# Patient Record
Sex: Female | Born: 1955 | Race: Black or African American | Hispanic: No | Marital: Single | Smoking: Never smoker
Health system: Southern US, Community
[De-identification: ages and names within clinical notes are randomized; demographics above are authoritative.]

## PROBLEM LIST (undated history)

## (undated) DIAGNOSIS — K219 Gastro-esophageal reflux disease without esophagitis: Secondary | ICD-10-CM

## (undated) DIAGNOSIS — Z87442 Personal history of urinary calculi: Secondary | ICD-10-CM

## (undated) DIAGNOSIS — R4589 Other symptoms and signs involving emotional state: Secondary | ICD-10-CM

## (undated) DIAGNOSIS — N200 Calculus of kidney: Secondary | ICD-10-CM

## (undated) DIAGNOSIS — I10 Essential (primary) hypertension: Secondary | ICD-10-CM

## (undated) DIAGNOSIS — E7800 Pure hypercholesterolemia, unspecified: Secondary | ICD-10-CM

## (undated) DIAGNOSIS — R7301 Impaired fasting glucose: Secondary | ICD-10-CM

## (undated) DIAGNOSIS — R232 Flushing: Secondary | ICD-10-CM

## (undated) HISTORY — DX: Other symptoms and signs involving emotional state: R45.89

## (undated) HISTORY — DX: Calculus of kidney: N20.0

## (undated) HISTORY — PX: TUBAL LIGATION: SHX77

## (undated) HISTORY — DX: Flushing: R23.2

## (undated) HISTORY — DX: Impaired fasting glucose: R73.01

## (undated) HISTORY — DX: Gastro-esophageal reflux disease without esophagitis: K21.9

## (undated) HISTORY — PX: INGUINAL HERNIA REPAIR: SHX194

## (undated) HISTORY — DX: Essential (primary) hypertension: I10

---

## 2000-11-24 ENCOUNTER — Emergency Department (HOSPITAL_COMMUNITY): Admission: EM | Admit: 2000-11-24 | Discharge: 2000-11-24 | Payer: Self-pay | Admitting: Emergency Medicine

## 2001-09-08 ENCOUNTER — Encounter: Payer: Self-pay | Admitting: Internal Medicine

## 2001-09-08 ENCOUNTER — Ambulatory Visit (HOSPITAL_COMMUNITY): Admission: RE | Admit: 2001-09-08 | Discharge: 2001-09-08 | Payer: Self-pay | Admitting: Internal Medicine

## 2002-09-09 ENCOUNTER — Encounter: Payer: Self-pay | Admitting: Internal Medicine

## 2002-09-09 ENCOUNTER — Ambulatory Visit (HOSPITAL_COMMUNITY): Admission: RE | Admit: 2002-09-09 | Discharge: 2002-09-09 | Payer: Self-pay | Admitting: Internal Medicine

## 2002-12-06 ENCOUNTER — Emergency Department (HOSPITAL_COMMUNITY): Admission: EM | Admit: 2002-12-06 | Discharge: 2002-12-06 | Payer: Self-pay | Admitting: Emergency Medicine

## 2003-01-12 ENCOUNTER — Emergency Department (HOSPITAL_COMMUNITY): Admission: EM | Admit: 2003-01-12 | Discharge: 2003-01-13 | Payer: Self-pay | Admitting: *Deleted

## 2003-09-12 ENCOUNTER — Ambulatory Visit (HOSPITAL_COMMUNITY): Admission: RE | Admit: 2003-09-12 | Discharge: 2003-09-12 | Payer: Self-pay | Admitting: Unknown Physician Specialty

## 2004-09-10 ENCOUNTER — Ambulatory Visit: Payer: Self-pay | Admitting: Family Medicine

## 2004-09-13 ENCOUNTER — Encounter (HOSPITAL_COMMUNITY): Admission: RE | Admit: 2004-09-13 | Discharge: 2004-10-13 | Payer: Self-pay | Admitting: Family Medicine

## 2004-09-13 ENCOUNTER — Ambulatory Visit (HOSPITAL_COMMUNITY): Admission: RE | Admit: 2004-09-13 | Discharge: 2004-09-13 | Payer: Self-pay | Admitting: Family Medicine

## 2004-09-13 ENCOUNTER — Ambulatory Visit (HOSPITAL_COMMUNITY): Admission: RE | Admit: 2004-09-13 | Discharge: 2004-09-13 | Payer: Self-pay | Admitting: Obstetrics and Gynecology

## 2004-10-25 ENCOUNTER — Ambulatory Visit: Payer: Self-pay | Admitting: Family Medicine

## 2004-11-15 ENCOUNTER — Ambulatory Visit: Payer: Self-pay | Admitting: Family Medicine

## 2004-11-20 ENCOUNTER — Ambulatory Visit (HOSPITAL_COMMUNITY): Admission: RE | Admit: 2004-11-20 | Discharge: 2004-11-20 | Payer: Self-pay | Admitting: Family Medicine

## 2004-11-22 ENCOUNTER — Ambulatory Visit (HOSPITAL_COMMUNITY): Admission: RE | Admit: 2004-11-22 | Discharge: 2004-11-22 | Payer: Self-pay | Admitting: Family Medicine

## 2004-11-29 ENCOUNTER — Ambulatory Visit (HOSPITAL_COMMUNITY): Admission: RE | Admit: 2004-11-29 | Discharge: 2004-11-29 | Payer: Self-pay | Admitting: Family Medicine

## 2004-12-14 ENCOUNTER — Ambulatory Visit: Payer: Self-pay | Admitting: Family Medicine

## 2005-04-15 ENCOUNTER — Ambulatory Visit: Payer: Self-pay | Admitting: Family Medicine

## 2005-09-16 ENCOUNTER — Ambulatory Visit (HOSPITAL_COMMUNITY): Admission: RE | Admit: 2005-09-16 | Discharge: 2005-09-16 | Payer: Self-pay | Admitting: Obstetrics and Gynecology

## 2005-10-07 ENCOUNTER — Ambulatory Visit (HOSPITAL_COMMUNITY): Admission: RE | Admit: 2005-10-07 | Discharge: 2005-10-07 | Payer: Self-pay | Admitting: Internal Medicine

## 2006-01-29 ENCOUNTER — Ambulatory Visit (HOSPITAL_COMMUNITY): Admission: RE | Admit: 2006-01-29 | Discharge: 2006-01-29 | Payer: Self-pay | Admitting: Obstetrics & Gynecology

## 2006-02-26 ENCOUNTER — Ambulatory Visit (HOSPITAL_COMMUNITY): Admission: RE | Admit: 2006-02-26 | Discharge: 2006-02-26 | Payer: Self-pay | Admitting: General Surgery

## 2006-02-26 HISTORY — PX: INGUINAL HERNIA REPAIR: SHX194

## 2006-08-27 ENCOUNTER — Ambulatory Visit (HOSPITAL_COMMUNITY): Admission: RE | Admit: 2006-08-27 | Discharge: 2006-08-27 | Payer: Self-pay | Admitting: Obstetrics and Gynecology

## 2006-09-19 ENCOUNTER — Ambulatory Visit (HOSPITAL_COMMUNITY): Admission: RE | Admit: 2006-09-19 | Discharge: 2006-09-19 | Payer: Self-pay | Admitting: Obstetrics and Gynecology

## 2006-10-15 ENCOUNTER — Inpatient Hospital Stay (HOSPITAL_COMMUNITY): Admission: RE | Admit: 2006-10-15 | Discharge: 2006-10-17 | Payer: Self-pay | Admitting: Obstetrics & Gynecology

## 2006-10-15 ENCOUNTER — Encounter (INDEPENDENT_AMBULATORY_CARE_PROVIDER_SITE_OTHER): Payer: Self-pay | Admitting: Specialist

## 2006-10-15 HISTORY — PX: TOTAL ABDOMINAL HYSTERECTOMY W/ BILATERAL SALPINGOOPHORECTOMY: SHX83

## 2007-06-11 ENCOUNTER — Encounter: Payer: Self-pay | Admitting: Family Medicine

## 2007-10-09 ENCOUNTER — Ambulatory Visit (HOSPITAL_COMMUNITY): Admission: RE | Admit: 2007-10-09 | Discharge: 2007-10-09 | Payer: Self-pay | Admitting: Obstetrics and Gynecology

## 2007-12-12 ENCOUNTER — Emergency Department (HOSPITAL_COMMUNITY): Admission: EM | Admit: 2007-12-12 | Discharge: 2007-12-12 | Payer: Self-pay | Admitting: Emergency Medicine

## 2008-09-09 ENCOUNTER — Ambulatory Visit (HOSPITAL_COMMUNITY): Admission: RE | Admit: 2008-09-09 | Discharge: 2008-09-09 | Payer: Self-pay | Admitting: Family Medicine

## 2008-10-14 ENCOUNTER — Ambulatory Visit (HOSPITAL_COMMUNITY): Admission: RE | Admit: 2008-10-14 | Discharge: 2008-10-14 | Payer: Self-pay | Admitting: Obstetrics and Gynecology

## 2008-12-06 ENCOUNTER — Encounter: Payer: Self-pay | Admitting: Internal Medicine

## 2008-12-13 ENCOUNTER — Ambulatory Visit (HOSPITAL_COMMUNITY): Admission: RE | Admit: 2008-12-13 | Discharge: 2008-12-13 | Payer: Self-pay | Admitting: Internal Medicine

## 2008-12-13 ENCOUNTER — Ambulatory Visit: Payer: Self-pay | Admitting: Internal Medicine

## 2008-12-13 HISTORY — PX: COLONOSCOPY: SHX174

## 2008-12-29 ENCOUNTER — Encounter: Payer: Self-pay | Admitting: Internal Medicine

## 2009-04-04 ENCOUNTER — Emergency Department (HOSPITAL_COMMUNITY): Admission: EM | Admit: 2009-04-04 | Discharge: 2009-04-04 | Payer: Self-pay | Admitting: Emergency Medicine

## 2009-09-15 ENCOUNTER — Ambulatory Visit (HOSPITAL_COMMUNITY): Admission: RE | Admit: 2009-09-15 | Discharge: 2009-09-15 | Payer: Self-pay | Admitting: Family Medicine

## 2009-10-20 ENCOUNTER — Ambulatory Visit (HOSPITAL_COMMUNITY): Admission: RE | Admit: 2009-10-20 | Discharge: 2009-10-20 | Payer: Self-pay | Admitting: Obstetrics and Gynecology

## 2009-11-29 ENCOUNTER — Ambulatory Visit: Payer: Self-pay | Admitting: Gastroenterology

## 2009-11-29 DIAGNOSIS — R110 Nausea: Secondary | ICD-10-CM | POA: Insufficient documentation

## 2009-11-29 DIAGNOSIS — K219 Gastro-esophageal reflux disease without esophagitis: Secondary | ICD-10-CM | POA: Insufficient documentation

## 2009-11-29 DIAGNOSIS — K5909 Other constipation: Secondary | ICD-10-CM | POA: Insufficient documentation

## 2010-03-12 ENCOUNTER — Emergency Department (HOSPITAL_COMMUNITY): Admission: EM | Admit: 2010-03-12 | Discharge: 2010-03-12 | Payer: Self-pay | Admitting: Emergency Medicine

## 2010-07-01 ENCOUNTER — Encounter: Payer: Self-pay | Admitting: Obstetrics and Gynecology

## 2010-07-12 NOTE — Letter (Signed)
Summary: rpc chart  rpc chart   Imported By: Curtis Sites 03/28/2010 11:30:03  _____________________________________________________________________  External Attachment:    Type:   Image     Comment:   External Document

## 2010-07-12 NOTE — Assessment & Plan Note (Signed)
Summary: stomach pains/acid reflux/ss   Visit Type:  f/u Primary Care Provider:  Lubertha South  Chief Complaint:  abd pain, reflux, and constipation.  History of Present Illness: Patient presents today for further evaluation of abd pain, reflux, constipation. She was seen previously at time of screening TCS by Dr. Jena Gauss last year. She c/o symptoms for one year. She has tried Nexium and omeprazole for several weeks but didn't help.  She works second shift. Due to heat where she works she doesn't feel like eating until she gets home at night. She admits to eating before bed. She denies waking up in middle of night but in AM she wakes up nauseated. Some heartburn. Weight up ten pounds in one month. Works in heat, makes it worse. No vomiting. Finds it hard to eat. No dysphagia. Some mild abd pains around navel. Unrelated to meals. Pain sometimes better with BM. BM infrequent, 3-4 times per week. No melena, brbpr. Uses gylcerin supp several times per week.   CT A/P in 4/11--> Nonobstructing bilateral renal calculi. Tiny umbilical hernia containing fat. Questionable tiny fascial defect in the left lower quadrant anterior abdominal wall with minimal fat herniation as above. Suspect prior lateral left lower quadrant ventral hernia repair. Small soft tissue nodule in the pelvis, new since previous exam, suspect related to granuloma formation or fat necrosis as result ofinterval hysterectomy. She saw Dr. Caesar Bookman who did not recommend surgery at this point.  Current Medications (verified): 1)  Celexa 40 Mg Tabs (Citalopram Hydrobromide) .... Once Daily 2)  Vicodin 5-500 Mg Tabs (Hydrocodone-Acetaminophen) .... As Needed 3)  Promethazine Hcl 25 Mg Tabs (Promethazine Hcl) .... As Needed 4)  Zocor 20 Mg Tabs (Simvastatin) .... Once Daily 5)  Relafen 750mg  .... Once Daily  Allergies (verified): 1)  ! Codeine 2)  ! Darvocet  Past History:  Past Medical History: Hyperlipidemia TCS, 7/10, Dr. Rourk-->  normal. Recent left foot pain/swelling followed by Dr. Lubertha South.  Past Surgical History: Hysterectomy with BSO, 5/08, Dr. Despina Hidden, secondary to large uterine fibroid, pelvic pain Left inguinal hernia repair, 2007, Dr. Lovell Sheehan  Family History: No FH of CRC, liver disease, chronic GI illnesses.   Social History: Single. One son. Never smoked. No alcohol or drugs. Works at [employer], Writer parts.   Review of Systems General:  Denies fever, chills, sweats, anorexia, fatigue, weakness, and weight loss. Eyes:  Denies vision loss. ENT:  Denies nasal congestion, sore throat, hoarseness, and difficulty swallowing. CV:  Denies chest pains, angina, palpitations, dyspnea on exertion, and peripheral edema. Resp:  Denies dyspnea at rest, dyspnea with exercise, cough, sputum, and wheezing. GI:  See HPI. GU:  Denies urinary burning and blood in urine. MS:  Complains of joint pain / LOM. Derm:  Denies rash and itching. Neuro:  Denies weakness, frequent headaches, memory loss, and confusion. Psych:  Denies depression and anxiety. Endo:  Denies unusual weight change. Heme:  Denies bruising and bleeding. Allergy:  Denies hives and rash.  Vital Signs:  Patient profile:   54 year old female Height:      62 inches Weight:      183 pounds BMI:     33.59 Temp:     98.0 degrees F oral Pulse rate:   60 / minute BP sitting:   104 / 68  (right arm) Cuff size:   regular  Vitals Entered By: Hendricks Limes LPN (November 29, 2009 2:22 PM)  Physical Exam  General:  Well developed, well nourished, no acute distress.  Head:  Normocephalic and atraumatic. Eyes:  Conjunctivae pink, no scleral icterus.  Mouth:  Oropharyngeal mucosa moist, pink.  No lesions, erythema or exudate.    Neck:  Supple; no masses or thyromegaly. Lungs:  Clear throughout to auscultation. Heart:  Regular rate and rhythm; no murmurs, rubs,  or bruits. Abdomen:  Bowel sounds normal.  Abdomen is soft, nontender, nondistended.  No  rebound or guarding.  No hepatosplenomegaly, masses or hernias.  No abdominal bruits.  Extremities:  No clubbing, cyanosis, edema or deformities noted. Neurologic:  Alert and  oriented x4;  grossly normal neurologically. Skin:  Intact without significant lesions or rashes. Cervical Nodes:  No significant cervical adenopathy. Psych:  Alert and cooperative. Normal mood and affect.  Impression & Recommendations:  Problem # 1:  NAUSEA, CHRONIC (ICD-787.02)  Nausea in AM and related to meals. No vomiting. Very mild abd pain which may be due to constipation. Suspect nausea due to GERD as she has typical gerd symptoms as well. She should avoid eating before bedtime. Add Dexilant for 20 days, samples provided. Her insurance company will not cover it so RX for lansoprazole given to take once Dexilant finished. She will call with PR in 2-3 weeks. If still problems, consider EGD. FYI, gallbladder remains in situ...symptoms not typical at this time. Discussed antireflux measures.  Orders: Est. Patient Level IV (84132)  Problem # 2:  CONSTIPATION, CHRONIC (ICD-564.09)  Add Miralax 17 grams daily for two weeks and then daily as needed to maintain regular BMs. Use gylcerin suppositories sparingly.   Orders: Est. Patient Level IV (44010)  Patient Instructions: 1)  Start Dexilant for your stomach. Take one 30 mins before first meal of day. #20 samples given. Your insurance will not cover this so your prescription is for lansoprazole which is closest to [facility]. Please start once Dexilant samples are finished. 2)  Start Miralax 17 grams (one capful) daily as needed for constipation. Use daily for first two weeks then as needed to have regular BM.  3)  Call with progress report in few weeks. If still with problems at that time, will pursue further work-up. 4)  Avoid foods high in acid content ( tomatoes, citrus juices, spicy foods) . Avoid eating within 3 to 4 hours of lying down or before exercising. Do  not over eat; try smaller more frequent meals.   5)  The medication list was reviewed and reconciled.  All changed / newly prescribed medications were explained.  A complete medication list was provided to the patient / caregiver. Prescriptions: LANSOPRAZOLE 30 MG CPDR (LANSOPRAZOLE) one by mouth 30 mins before first meal of day  #30 x 11   Entered and Authorized by:   Leanna Battles. Dixon Boos   Signed by:   Leanna Battles Roshan Roback PA-C on 11/29/2009   Method used:   Print then Give to Patient   RxID:   [id number] POLYETHYLENE GLYCOL 3350  POWD (POLYETHYLENE GLYCOL 3350) 17 grams by mouth daily for two weeks and then as needed to maintain regular bm  #527 grams x 5   Entered and Authorized by:   Leanna Battles. Dixon Boos   Signed by:   Leanna Battles Neliah Cuyler PA-C on 11/29/2009   Method used:   Print then Give to Patient   RxID:   [id number]

## 2010-08-22 LAB — URINE MICROSCOPIC-ADD ON

## 2010-08-22 LAB — URINE CULTURE

## 2010-08-22 LAB — URINALYSIS, ROUTINE W REFLEX MICROSCOPIC
Nitrite: NEGATIVE
Specific Gravity, Urine: <1.005 — ABNORMAL LOW (ref 1.005–1.030)
Urobilinogen, UA: 0.2 mg/dL (ref 0.0–1.0)

## 2010-09-14 ENCOUNTER — Other Ambulatory Visit (HOSPITAL_COMMUNITY): Payer: Self-pay | Admitting: Family Medicine

## 2010-09-14 DIAGNOSIS — Z139 Encounter for screening, unspecified: Secondary | ICD-10-CM

## 2010-10-09 ENCOUNTER — Ambulatory Visit (HOSPITAL_COMMUNITY)
Admission: RE | Admit: 2010-10-09 | Discharge: 2010-10-09 | Disposition: A | Discharge status: Home / Self Care | Payer: BC Managed Care – PPO | Source: Ambulatory Visit | Attending: Family Medicine | Admitting: Family Medicine

## 2010-10-09 ENCOUNTER — Other Ambulatory Visit: Payer: Self-pay | Admitting: Family Medicine

## 2010-10-09 DIAGNOSIS — M25562 Pain in left knee: Secondary | ICD-10-CM

## 2010-10-09 DIAGNOSIS — M25569 Pain in unspecified knee: Secondary | ICD-10-CM | POA: Insufficient documentation

## 2010-10-23 ENCOUNTER — Ambulatory Visit (HOSPITAL_COMMUNITY): Payer: Self-pay

## 2010-10-23 NOTE — Op Note (Signed)
NAME:  Shelby Guerra, DANTE              ACCOUNT NO.:  [id number]   MEDICAL RECORD NO.:  [id number]          PATIENT TYPE:  AMB   LOCATION:  DAY                           FACILITY:  [facility]   PHYSICIAN:  R. Roetta Sessions, M.D. DATE OF BIRTH:  April 05, 1956   DATE OF PROCEDURE:  12/13/2008  DATE OF DISCHARGE:                               OPERATIVE REPORT   PROCEDURE PERFORMED:  Screening colonoscopy.   INDICATIONS FOR PROCEDURE:  This is a 55 year old lady with no lower GI  tract symptoms, sent over courtesy [name], FNP for colorectal  cancer screening via colonoscopy.  There is no family history of colon  cancer or polyps.  She has never had her lower GI tract imaged.  Colonoscopy is now being done as a screening maneuver.  Risks, benefits,  alternatives and limitations have been reviewed, questions answered.  Please see documentation in the medical record.   PROCEDURE NOTE:  O2 saturation, blood pressure, pulse and respirations  were monitored throughout the entirety of the procedure.   CONSCIOUS SEDATION:  Versed 4 mg IV, Demerol 100 mg IV in divided doses.   INSTRUMENT:  Pentax video chip system.   FINDINGS:  Digital rectal exam revealed no abnormalities.   ENDOSCOPIC FINDINGS:  The prep was adequate.  Colon:  Colonic mucosa was surveyed from rectosigmoid junction to the  left, transverse, right colon to the appendiceal orifice, ileocecal  valve and cecum.  These structures were well seen and photographed for  the record.  From this level scope was slowly, cautiously withdrawn.  All previously mentioned mucosal surfaces were again seen.  The colonic  mucosa appeared normal.  Scope was pulled down to the rectum where a  thorough examination of the rectal mucosa and en face view of the anal  canal and rectum demonstrated no abnormalities.  The patient tolerated  the procedure well and was reacted in endoscopy.  Cecal withdrawal time  6 minutes 30 seconds.   IMPRESSION:  1.  Normal rectum.  2. Normal colon.   RECOMMENDATIONS:  A repeat screening colonoscopy in 10 years.      Jonathon Bellows, M.D.  Electronically Signed     RMR/MEDQ  D:  12/13/2008  T:  12/13/2008  Job:  [id number]   cc:   [name] FNP

## 2010-10-26 NOTE — Discharge Summary (Signed)
NAMETRISTAN, BRAMBLE NO.:  [id number]   MEDICAL RECORD NO.:  [id number]          PATIENT TYPE:  INP   LOCATION:  A428                          FACILITY:  [facility]   PHYSICIAN:  Lazaro Arms, M.D.   DATE OF BIRTH:  March 29, 1956   DATE OF ADMISSION:  10/15/2006  DATE OF DISCHARGE:  05/09/2008LH                               DISCHARGE SUMMARY   DISCHARGE DIAGNOSES:  1. Status post total abdominal hysterectomy, bilateral salpingo-      oophorectomy.  2. Unremarkable postoperative course.   PROCEDURE:  Total abdominal hysterectomy bilateral salpingo-  oophorectomy.   Please refer to the history and physical and op note for details of  admission to the hospital.   HOSPITAL COURSE:  The patient was admitted postoperatively.  She had a  completely unremarkable postoperative course, started clear liquids and  a regular diet, voided without symptoms.  She was extensively  ambulatory, and the incision was clean, dry and intact.  She did have a  little initial temperature elevation to 100.1, but thereafter it has  been normal.  It is probably atelectasis.  She is a not a smoker.  Incision was clean, dry, and intact.  She was discharged home on  Percocet and Motrin.  She will be seen at the office next week for  followup.      Lazaro Arms, M.D.  Electronically Signed     LHE/MEDQ  D:  10/17/2006  T:  10/17/2006  Job:  [id number]

## 2010-10-26 NOTE — H&P (Signed)
NAME:  JANNELL, FRANTA NO.:  [id number]   MEDICAL RECORD NO.:  [id number]          PATIENT TYPE:  AMB   LOCATION:  DAY                           FACILITY:  [facility]   PHYSICIAN:  Lazaro Arms, M.D.   DATE OF BIRTH:  03-25-1956   DATE OF ADMISSION:  DATE OF DISCHARGE:  LH                              HISTORY & PHYSICAL   Shelby Guerra is a 55 year old African-American female, gravida 1, para 1,  aborta 0, status post tubal ligation who has been seen for an enlarged  fibroid uterus in our office in the past.  We have actually got an  ultrasound back in March .  She is postmenopausal.  At that time she was  found to have a large fibroid.  Unfortunately she has begun to have a  great deal of pelvic abdominal pain.  Examination is consist with a  defeating myoma, bimanual exam reveals it to be approximately 10 week  size and quite tender.  Her adnexa is negative on exam and an  ultrasound.  We discussed the options which the patient has considered.  We felt that Lupron was not going to be a longterm solution.  As a  result, we decided to proceed with a TAH-BSO for an enlarged fibroid  uterus with degenerating myomas.   PAST MEDICAL HISTORY:  Negative.   PAST SURGICAL HISTORY:  Tubal ligation, D&C, inguinal hernia times two  days.   PAST OB:  Vaginal delivery.   ALLERGIES:  Codeine.   MEDICATIONS:  Currently none.   REVIEW OF SYSTEMS:  Otherwise negative.   PHYSICAL EXAMINATION:  VITAL SIGNS:  Her weight is 165 pounds, blood  pressure 122/66.  HEENT:  Unremarkable.  Thyroid is normal.  LUNGS:  Clear to auscultation bilaterally.  HEART:  Regular rate and rhythm with no murmurs, rubs or gallops.  BREASTS  Without mass, discharge, or skin changes.  ABDOMEN:  Benign hepatosplenomegaly or masses.  She has tenderness in  the mid pelvis.  GI:  She has normal external genitalia; vagina appears moist without  discharge.  Cervix parous, without lesions.  Uterus is 10-12 week  size  and tender to palpitation.  Adnexa is negative.  Hemoccult is negative.  No masses.  EXTREMITIES:  Warm without no edema.  NEUROLOGIC:  Grossly intact.   IMPRESSION:  1. Enlarged fibroid uterus.  2. Degeneration causing pelvic pain.   PLAN:  The patient is admitted for TAH-BSO.  She understands the risks,  benefits, indications, and alternatives.  Will proceed.      Lazaro Arms, M.D.  Electronically Signed     LHE/MEDQ  D:  10/14/2006  T:  10/14/2006  Job:  [id number]

## 2010-10-26 NOTE — Op Note (Signed)
NAMEDALEISA, HALPERIN NO.:  [id number]   MEDICAL RECORD NO.:  [id number]          PATIENT TYPE:  AMB   LOCATION:  DAY                           FACILITY:  [facility]   PHYSICIAN:  Dalia Heading, M.D.  DATE OF BIRTH:  [birth year]   DATE OF PROCEDURE:  02/26/2006  DATE OF DISCHARGE:                                 OPERATIVE REPORT   PREOPERATIVE DIAGNOSIS:  Recurrent left inguinal hernia.   POSTOPERATIVE DIAGNOSIS:  Recurrent left inguinal hernia.   PROCEDURE:  Recurrent left inguinal herniorrhaphy.   SURGEON:  Franky Macho, M.D.   ANESTHESIA:  General.   INDICATIONS:  The patient is a 55 year old black female, status post a left  inguinal herniorrhaphy over 10 years ago, who now presents with left groin  pain and a probable recurrent left inguinal hernia.  The risks and benefits  of the procedure including bleeding, infection, pain, and recurrence of the  hernia were fully explained to the patient, who gave informed consent.   PROCEDURE NOTE:  The patient was placed in the supine position.  After  general anesthesia was administered, the left groin region was prepped and  draped using the usual sterile technique with Betadine.  Surgical site  confirmation was performed.   A left groin incision was made down to the external oblique aponeurosis.  The aponeurosis was incised.  The patient was noted to have an old  polypropylene mesh plug present.  This was removed.  This is probably the  source of the patient's pain and sensation of a lump in this region.  Once  this was removed, the internal ring hernia was repaired using an  Ultrapro/Monocryl plug.  This was secured to the conjoined tendon and to the  shelving edge of Poupart's ligament using 2-0 Novofil interrupted sutures.  An onlay patch of Monocryl was then placed over this repair.  The external  oblique aponeurosis was reapproximated using a 2-0 Vicryl running suture.  The subcutaneous layer was  reapproximated using a 3-0 Vicryl interrupted  suture.  The skin was closed using a 4-0 Vicryl subcuticular suture.  0.5%  Sensorcaine was instilled into the surrounding wound.  Dermabond was then  applied.   All tape and needle counts were correct at the end of the procedure.  The  patient was awakened and transferred to PACU in stable condition.   COMPLICATIONS:  None.   SPECIMEN:  None.   BLOOD LOSS:  Minimal.      Dalia Heading, M.D.  Electronically Signed     MAJ/MEDQ  D:  02/26/2006  T:  02/27/2006  Job:  [id number]   cc:   Tilda Burrow, M.D.  Fax: [phone]

## 2010-10-26 NOTE — Op Note (Signed)
Shelby Guerra, Shelby Guerra NO.:  [id number]   MEDICAL RECORD NO.:  [id number]          PATIENT TYPE:  INP   LOCATION:  A428                          FACILITY:  [facility]   PHYSICIAN:  Lazaro Arms, M.D.   DATE OF BIRTH:  1956/03/15   DATE OF PROCEDURE:  10/15/2006  DATE OF DISCHARGE:  10/17/2006                               OPERATIVE REPORT   PREOPERATIVE DIAGNOSES:  1. Fibroid uterus.  2. Degenerating myoma with severe pelvic pain.   POSTOPERATIVE DIAGNOSES:  1. Fibroid uterus.  2. Degenerating myoma with severe pelvic pain.   PROCEDURE:  Total abdominal hysterectomy and bilateral salpingo-  oophorectomy.   SURGEON:  Lazaro Arms, M.D.   ANESTHESIA:  General endotracheal anesthesia.   FINDINGS:  Patient had a relatively small uterus.  Did have a myoma and  the ovaries appeared to be menopausal.  There were no other  abnormalities.   DESCRIPTION OF PROCEDURE:  Patient was taken to the operating room and  placed in the supine position and underwent general endotracheal  anesthesia.  The vagina was prepped and draped.  Foley catheter was  placed.  Her abdomen was prepped and draped in the usual sterile  fashion.  Pfannenstiel skin incision was made, carried down sharply to  the rectus fascia, scored in the midline and continued laterally.  The  fascia was taken off the muscle superiorly and inferiorly without  difficulty.  The muscles were divided.  Peritoneal cavity was entered.  An Alexis wound retractor was placed.  The uterine cornu were grasped.  The round ligaments were suture ligated and cut.  The infundibulopelvic  ligaments were clamped, cut, and suture ligated, double suture ligated  with good hemostasis.  Vesicouterine laceration flap was created.  The  uterine vessel was skeletonized, clamped, cut, and suture ligated  bilaterally.  The cardinal ligament down to the cervix was clamped, cut,  and suture ligated medial to the uterine vessels.  The  vagina was  crossclamped, specimen was removed.  Vaginal angle sutures were placed  and vagina was closed in interrupted figure-of-eight fashion.  There was  good hemostasis.  Pelvis irrigated vigorously.  All pedicles found to be  hemostasis.  The Alexis wound retractor was removed.  Muscles and  peritoneum reapproximated loosely.  Fascia closed using 0 Vicryl  running.  Subcutaneous tissue was made hemostatic and irrigated.  Subcutaneous tissue was reapproximated using 2-0 plain and the skin was  closed using 3-0 Vicryl and Keith needle in subcuticular fashion.  Dermabond was then placed.  The patient tolerated the procedure well.  She experienced 200 mL of blood loss.  Ancef prophylactically.      Lazaro Arms, M.D.  Electronically Signed     LHE/MEDQ  D:  10/17/2006  T:  10/17/2006  Job:  [id number]

## 2010-10-26 NOTE — Procedures (Signed)
Shelby Guerra, Shelby Guerra              ACCOUNT NO.:  [id number]   MEDICAL RECORD NO.:  [id number]          PATIENT TYPE:  OUT   LOCATION:  RESP                          FACILITY:  [facility]   PHYSICIAN:  Edward L. Juanetta Gosling, M.D.DATE OF BIRTH:  17-Apr-1956   DATE OF PROCEDURE:  11/20/2004  DATE OF DISCHARGE:                              PULMONARY FUNCTION TEST   1.  Spirometry shows a mild ventilatory defect with minimal airflow      obstruction.  2.  Lung volumes are normal.  3.  DLCO is normal.  4.  Arterial blood gases are normal.       ELH/MEDQ  D:  11/20/2004  T:  11/20/2004  Job:  [id number]

## 2010-10-26 NOTE — H&P (Signed)
NAMEESSENSE, BOUSQUET NO.:  [id number]   MEDICAL RECORD NO.:  [id number]          PATIENT TYPE:  AMB   LOCATION:  DAY                           FACILITY:  [facility]   PHYSICIAN:  Dalia Heading, M.D.  DATE OF BIRTH:  [birth year]   DATE OF ADMISSION:  DATE OF DISCHARGE:  LH                                HISTORY & PHYSICAL   CHIEF COMPLAINT:  Left inguinal pain.   HISTORY OF PRESENT ILLNESS:  The patient is a 55 year old black female who  is referred for evaluation and treatment of left groin pain.  This has been  present for several months and is made worse with straining.  She is status  post a left inguinal herniorrhaphy over ten years ago.  No nausea or  vomiting have been noted.   PAST MEDICAL HISTORY:  Unremarkable.   PAST SURGICAL HISTORY:  As noted above.   CURRENT MEDICATIONS:  None.   ALLERGIES:  CODEINE.   REVIEW OF SYSTEMS:  Noncontributory.   PHYSICAL EXAMINATION:  GENERAL:  The patient is a well-developed, well-nourished black female in no  acute distress.  LUNGS:  Clear to auscultation with equal breath sounds bilaterally.  HEART:  Examination reveals regular rate and rhythm without S3, S4, or  murmurs.  ABDOMEN:  Soft and nondistended.  She is tender in the left lateral inguinal  region with a possible hernia present.  No hepatosplenomegaly or masses  noted.   IMPRESSION:  Recurrent left inguinal hernia.   PLAN:  The patient is scheduled for recurrent left inguinal herniorrhaphy on  February 26, 2006.  The risks and benefits of the procedure including  bleeding, infection, pain, and the possibility of recurrence of the hernia  were fully explained to the patient, who gave informed consent.      Dalia Heading, M.D.  Electronically Signed     MAJ/MEDQ  D:  02/20/2006  T:  02/20/2006  Job:  [id number]   cc:   Tilda Burrow, M.D.  Fax: [phone]

## 2010-10-29 ENCOUNTER — Ambulatory Visit (HOSPITAL_COMMUNITY)
Admission: RE | Admit: 2010-10-29 | Discharge: 2010-10-29 | Disposition: A | Discharge status: Home / Self Care | Payer: BC Managed Care – PPO | Source: Ambulatory Visit | Attending: Family Medicine | Admitting: Family Medicine

## 2010-10-29 DIAGNOSIS — Z1231 Encounter for screening mammogram for malignant neoplasm of breast: Secondary | ICD-10-CM | POA: Insufficient documentation

## 2010-10-29 DIAGNOSIS — Z139 Encounter for screening, unspecified: Secondary | ICD-10-CM

## 2011-03-07 LAB — URINALYSIS, ROUTINE W REFLEX MICROSCOPIC
Glucose, UA: NEGATIVE
Nitrite: NEGATIVE
Specific Gravity, Urine: 1.015
pH: 6

## 2011-03-07 LAB — URINE MICROSCOPIC-ADD ON

## 2011-09-30 ENCOUNTER — Other Ambulatory Visit: Payer: Self-pay | Admitting: Obstetrics and Gynecology

## 2011-09-30 DIAGNOSIS — Z139 Encounter for screening, unspecified: Secondary | ICD-10-CM

## 2011-11-04 ENCOUNTER — Ambulatory Visit (HOSPITAL_COMMUNITY): Payer: BC Managed Care – PPO

## 2011-11-11 ENCOUNTER — Ambulatory Visit (HOSPITAL_COMMUNITY): Payer: BC Managed Care – PPO

## 2011-11-11 ENCOUNTER — Ambulatory Visit (HOSPITAL_COMMUNITY)
Admission: RE | Admit: 2011-11-11 | Discharge: 2011-11-11 | Disposition: A | Discharge status: Home / Self Care | Payer: BC Managed Care – PPO | Source: Ambulatory Visit | Attending: Obstetrics and Gynecology | Admitting: Obstetrics and Gynecology

## 2011-11-11 DIAGNOSIS — Z139 Encounter for screening, unspecified: Secondary | ICD-10-CM

## 2011-11-11 DIAGNOSIS — Z1231 Encounter for screening mammogram for malignant neoplasm of breast: Secondary | ICD-10-CM | POA: Insufficient documentation

## 2012-01-26 ENCOUNTER — Encounter (HOSPITAL_COMMUNITY): Payer: Self-pay | Admitting: *Deleted

## 2012-01-26 ENCOUNTER — Emergency Department (HOSPITAL_COMMUNITY)
Admission: EM | Admit: 2012-01-26 | Discharge: 2012-01-26 | Disposition: A | Discharge status: Home / Self Care | Payer: BC Managed Care – PPO | Attending: Emergency Medicine | Admitting: Emergency Medicine

## 2012-01-26 ENCOUNTER — Emergency Department (HOSPITAL_COMMUNITY): Payer: BC Managed Care – PPO

## 2012-01-26 DIAGNOSIS — S161XXA Strain of muscle, fascia and tendon at neck level, initial encounter: Secondary | ICD-10-CM

## 2012-01-26 DIAGNOSIS — Y9389 Activity, other specified: Secondary | ICD-10-CM | POA: Insufficient documentation

## 2012-01-26 DIAGNOSIS — E7800 Pure hypercholesterolemia, unspecified: Secondary | ICD-10-CM | POA: Insufficient documentation

## 2012-01-26 DIAGNOSIS — Y92009 Unspecified place in unspecified non-institutional (private) residence as the place of occurrence of the external cause: Secondary | ICD-10-CM | POA: Insufficient documentation

## 2012-01-26 DIAGNOSIS — S139XXA Sprain of joints and ligaments of unspecified parts of neck, initial encounter: Secondary | ICD-10-CM | POA: Insufficient documentation

## 2012-01-26 DIAGNOSIS — Y998 Other external cause status: Secondary | ICD-10-CM | POA: Insufficient documentation

## 2012-01-26 DIAGNOSIS — T07XXXA Unspecified multiple injuries, initial encounter: Secondary | ICD-10-CM | POA: Insufficient documentation

## 2012-01-26 DIAGNOSIS — W1789XA Other fall from one level to another, initial encounter: Secondary | ICD-10-CM | POA: Insufficient documentation

## 2012-01-26 HISTORY — DX: Pure hypercholesterolemia, unspecified: E78.00

## 2012-01-26 MED ORDER — IBUPROFEN 800 MG PO TABS
800.0000 mg | ORAL_TABLET | Freq: Once | ORAL | Status: AC
  Administered 2012-01-26: 800 mg via ORAL
  Filled 2012-01-26: qty 1

## 2012-01-26 MED ORDER — HYDROCODONE-ACETAMINOPHEN 5-325 MG PO TABS
1.0000 | ORAL_TABLET | Freq: Once | ORAL | Status: AC
  Administered 2012-01-26: 1 via ORAL
  Filled 2012-01-26: qty 1

## 2012-01-26 MED ORDER — HYDROCODONE-ACETAMINOPHEN 5-325 MG PO TABS: 1.0000 | ORAL_TABLET | Freq: Four times a day (QID) | ORAL | Status: AC | PRN

## 2012-01-26 NOTE — ED Notes (Signed)
Pt tripped and fell yesterday hitting elbow, knee, and head. Pt c/o slight headache yesterday and neck soreness, left knee pain and lower back pain.

## 2012-01-26 NOTE — ED Provider Notes (Signed)
History     CSN: [id number]  Arrival date & time 01/26/12  1941   First MD Initiated Contact with Patient 01/26/12 2006      Chief Complaint  Patient presents with  . Fall  . Back Pain  . Elbow Pain  . Knee Pain    (Consider location/radiation/quality/duration/timing/severity/associated sxs/prior treatment) HPI Comments: Larey Seat backwards.  States she had mild pain yesterday but much worse today.  Denies current head pain.  Neck pain is R lateral only.  Demonstrates FROM of elbow without apparent difficulty.  Pain with movement of lower back and L knee.  Patient is a 56 y.o. female presenting with fall, back pain, and knee pain. The history is provided by the patient. No language interpreter was used.  Fall The accident occurred yesterday. Incident: standing on a chair trying to reach a pair of shoes in her clost. She fell from a height of 1 to 2 ft. The point of impact was the head (lower back). The pain is present in the left elbow and left knee (lower back). The pain is moderate. She was ambulatory at the scene. There was no entrapment after the fall. There was no drug use involved in the accident. There was no alcohol use involved in the accident. Pertinent negatives include no vomiting, no headaches and no loss of consciousness. The symptoms are aggravated by standing. She has tried NSAIDs for the symptoms. The treatment provided no relief.  Back Pain  Pertinent negatives include no headaches.  Knee Pain Associated symptoms include neck pain. Pertinent negatives include no headaches or vomiting.    Past Medical History  Diagnosis Date  . High cholesterol     History reviewed. No pertinent past surgical history.  History reviewed. No pertinent family history.  History  Substance Use Topics  . Smoking status: Never Smoker   . Smokeless tobacco: Not on file  . Alcohol Use: No    OB History    Grav Para Term Preterm Abortions TAB SAB Ect Mult Living                   Review of Systems  HENT: Positive for neck pain.   Gastrointestinal: Negative for vomiting.  Musculoskeletal: Positive for back pain.       Knee injur  Neurological: Negative for loss of consciousness and headaches.  All other systems reviewed and are negative.    Allergies  Codeine and Propoxyphene-acetaminophen  Home Medications  No current outpatient prescriptions on file.  BP 142/74  Pulse 64  Temp 98.1 F (36.7 C) (Oral)  Resp 20  Ht 5' 1.5" (1.562 m)  Wt 180 lb (81.647 kg)  BMI 33.46 kg/m2  SpO2 100%  Physical Exam  Nursing note and vitals reviewed. Constitutional: She is oriented to person, place, and time. She appears well-developed and well-nourished. No distress.  HENT:  Head: Normocephalic.    Eyes: EOM are normal. Pupils are equal, round, and reactive to light.  Neck: Trachea normal and normal range of motion. Muscular tenderness present. No spinous process tenderness present.    Cardiovascular: Normal rate, regular rhythm and normal heart sounds.   Pulmonary/Chest: Effort normal and breath sounds normal.  Abdominal: Soft. She exhibits no distension. There is no tenderness.  Musculoskeletal:       Left elbow: Normal. She exhibits normal range of motion, no swelling, no effusion, no deformity and no laceration. no tenderness found.       Left knee: She exhibits decreased range of motion.  She exhibits no swelling, no effusion, no ecchymosis, no deformity, no laceration and no erythema. tenderness found.       Back:       Legs:      Pain superior to patella.  Neurological: She is alert and oriented to person, place, and time.  Skin: Skin is warm and dry.  Psychiatric: She has a normal mood and affect. Judgment normal.    ED Course  Procedures (including critical care time)  Labs Reviewed - No data to display Dg Lumbar Spine Complete  01/26/2012  *RADIOLOGY REPORT*  Clinical Data: Back pain after fall.  LUMBAR SPINE - COMPLETE 4+ VIEW   Comparison: Lumbar spine radiographs 10/07/2005.  CT abdomen and pelvis 02/09/2010.  Findings: Five lumbar type vertebra with prominent transverse processes of L5.  Normal alignment of the lumbar vertebra and facet joints.  Degenerative changes in the lumbar spine with endplate hypertrophy.  Intervertebral disc space heights are mostly preserved.  No vertebral compression deformities.  No focal bone lesion or bone destruction.  Bone cortex and trabecular architecture appear intact.  No significant change since previous study.  IMPRESSION: Degenerative changes in the lumbar spine.  No displaced fractures identified.  Original Report Authenticated By: Marlon Pel, M.D.   Dg Knee Complete 4 Views Left  01/26/2012  *RADIOLOGY REPORT*  Clinical Data: Knee pain after fall.  LEFT KNEE - COMPLETE 4+ VIEW  Comparison: 10/09/2010  Findings: Narrowed medial compartment suggesting mild degenerative change. No evidence of acute fracture or subluxation.  No focal bone lesions.  Bone matrix and cortex appear intact.  No abnormal radiopaque densities in the soft tissues.  No effusion.  No significant change since previous study.  IMPRESSION: No acute bony abnormalities.  Original Report Authenticated By: Marlon Pel, M.D.     1. Multiple contusions   2. Cervical strain       MDM  Ibuprofen rx-hydrocodone, 20 Ice F/u with dr. Gerda Diss prn        Evalina Field, PA 01/26/12 2137

## 2012-01-27 NOTE — ED Provider Notes (Signed)
Medical screening examination/treatment/procedure(s) were performed by non-physician practitioner and as supervising physician I was immediately available for consultation/collaboration.   Carleene Cooper III, MD 01/27/12 [phone]

## 2012-03-06 ENCOUNTER — Encounter: Payer: Self-pay | Admitting: Internal Medicine

## 2012-03-09 ENCOUNTER — Telehealth: Payer: Self-pay | Admitting: Gastroenterology

## 2012-03-09 ENCOUNTER — Ambulatory Visit: Payer: BC Managed Care – PPO | Admitting: Gastroenterology

## 2012-03-09 NOTE — Telephone Encounter (Signed)
Pt was a no show

## 2012-09-02 ENCOUNTER — Other Ambulatory Visit: Payer: Self-pay | Admitting: Family Medicine

## 2012-09-03 NOTE — Telephone Encounter (Signed)
You saw patient on 07/30/2012 for UTI and prescribed this medicine. Refill or not?

## 2012-09-30 ENCOUNTER — Encounter: Payer: Self-pay | Admitting: Family Medicine

## 2012-09-30 ENCOUNTER — Ambulatory Visit (INDEPENDENT_AMBULATORY_CARE_PROVIDER_SITE_OTHER): Payer: BC Managed Care – PPO | Admitting: Family Medicine

## 2012-09-30 VITALS — BP 113/74 | Temp 98.2°F | Wt 183.4 lb

## 2012-09-30 DIAGNOSIS — J322 Chronic ethmoidal sinusitis: Secondary | ICD-10-CM

## 2012-09-30 MED ORDER — LORATADINE 10 MG PO TABS: 10.0000 mg | ORAL_TABLET | Freq: Every day | ORAL | Status: DC

## 2012-09-30 MED ORDER — AZITHROMYCIN 250 MG PO TABS: ORAL_TABLET | ORAL | Status: DC

## 2012-09-30 MED ORDER — ONDANSETRON 4 MG PO TBDP: ORAL_TABLET | ORAL | Status: DC

## 2012-09-30 NOTE — Progress Notes (Signed)
  Subjective:    Patient ID: Shelby Guerra, female    DOB: December 13, 1955, 57 y.o.   MRN: [id number]  Emesis  This is a new problem. The current episode started in the past 7 days. The problem occurs 2 to 4 times per day. The problem has been gradually worsening. The maximum temperature recorded prior to her arrival was 100 - 100.9 F. The fever has been present for 1 to 2 days. Associated symptoms include chills, coughing, headaches, myalgias and sweats. She has tried acetaminophen for the symptoms. The treatment provided mild relief.      Review of Systems  Constitutional: Positive for chills.  Respiratory: Positive for cough.   Gastrointestinal: Positive for vomiting.  Musculoskeletal: Positive for myalgias.  Neurological: Positive for headaches.  All other systems reviewed and are negative.       Objective:   Physical Exam Alert hydration good. Frontal tenderness. Vitals reviewed. Pharynx slight congestion. Lungs clear. Heart regular in rhythm.       Assessment & Plan:  Impression #1 sinusitis with secondary nausea. #2 allergic rhinitis. Plan as per orders. Symptomatic care discussed. Warning signs discussed. WSL

## 2012-10-05 ENCOUNTER — Other Ambulatory Visit: Payer: Self-pay | Admitting: Adult Health

## 2012-10-05 DIAGNOSIS — Z09 Encounter for follow-up examination after completed treatment for conditions other than malignant neoplasm: Secondary | ICD-10-CM

## 2012-10-14 ENCOUNTER — Telehealth: Payer: Self-pay | Admitting: Family Medicine

## 2012-10-14 NOTE — Telephone Encounter (Signed)
Patient requesting referral to podiatry.  Bilateral foot pain, possible ingrown toenail right 2nd toe, possible corn on left great toe.  Hurting past 2 weeks, not seen here for that.  Referral not required by insurance, please advise.

## 2012-10-14 NOTE — Telephone Encounter (Signed)
Give name of pod and they can call

## 2012-10-15 NOTE — Telephone Encounter (Signed)
LMOVM of cell#, gave name & # to 2 podiatry offices, explained that no referral was required and pt may call to set up   [facility] - [phone] and [facility] - [phone]

## 2012-10-25 ENCOUNTER — Other Ambulatory Visit: Payer: Self-pay | Admitting: Family Medicine

## 2012-11-16 ENCOUNTER — Ambulatory Visit (HOSPITAL_COMMUNITY)
Admission: RE | Admit: 2012-11-16 | Discharge: 2012-11-16 | Disposition: A | Discharge status: Home / Self Care | Payer: BC Managed Care – PPO | Source: Ambulatory Visit | Attending: Adult Health | Admitting: Adult Health

## 2012-11-16 DIAGNOSIS — Z09 Encounter for follow-up examination after completed treatment for conditions other than malignant neoplasm: Secondary | ICD-10-CM

## 2012-11-16 DIAGNOSIS — Z1231 Encounter for screening mammogram for malignant neoplasm of breast: Secondary | ICD-10-CM | POA: Insufficient documentation

## 2013-02-02 ENCOUNTER — Ambulatory Visit (INDEPENDENT_AMBULATORY_CARE_PROVIDER_SITE_OTHER): Payer: BC Managed Care – PPO | Admitting: Family Medicine

## 2013-02-02 ENCOUNTER — Encounter: Payer: Self-pay | Admitting: Family Medicine

## 2013-02-02 VITALS — BP 120/78 | Temp 98.6°F | Ht 61.0 in | Wt 185.8 lb

## 2013-02-02 DIAGNOSIS — I889 Nonspecific lymphadenitis, unspecified: Secondary | ICD-10-CM

## 2013-02-02 MED ORDER — AZITHROMYCIN 250 MG PO TABS: ORAL_TABLET | ORAL | Status: DC

## 2013-02-02 NOTE — Progress Notes (Signed)
  Subjective:    Patient ID: Shelby Guerra, female    DOB: May 17, 1956, 57 y.o.   MRN: [id number]  Sore Throat  This is a new problem. The current episode started in the past 7 days. Associated symptoms include abdominal pain, congestion and headaches.  felt some fever. wwith sig sore throat in the morning  No one else sick  Took alka seltzer cold plus off and on helped alittle, but then vomited   Review of Systems  HENT: Positive for congestion.   Gastrointestinal: Positive for abdominal pain.  Neurological: Positive for headaches.   ROS otherwise negative    Objective:   Physical Exam Alert decent hydration. Moderate malaise. Neck supple. TMs normal. Pharynx erythematous lungs clear. Heart regular in rhythm.       Assessment & Plan:  Impression acute pharyngitis/lymphadenitis. Plan symptomatic care given. Work excuse written. Z-Pak prescribed. WSL

## 2013-02-04 DIAGNOSIS — Z029 Encounter for administrative examinations, unspecified: Secondary | ICD-10-CM

## 2013-03-10 ENCOUNTER — Ambulatory Visit (INDEPENDENT_AMBULATORY_CARE_PROVIDER_SITE_OTHER): Payer: BC Managed Care – PPO | Admitting: Adult Health

## 2013-03-10 ENCOUNTER — Encounter: Payer: Self-pay | Admitting: Adult Health

## 2013-03-10 VITALS — BP 124/80 | HR 76 | Ht 61.0 in | Wt 189.0 lb

## 2013-03-10 DIAGNOSIS — Z01419 Encounter for gynecological examination (general) (routine) without abnormal findings: Secondary | ICD-10-CM

## 2013-03-10 DIAGNOSIS — Z1212 Encounter for screening for malignant neoplasm of rectum: Secondary | ICD-10-CM

## 2013-03-10 DIAGNOSIS — R232 Flushing: Secondary | ICD-10-CM

## 2013-03-10 HISTORY — DX: Flushing: R23.2

## 2013-03-10 LAB — HEMOCCULT GUIAC POC 1CARD (OFFICE)

## 2013-03-10 MED ORDER — VENLAFAXINE HCL 75 MG PO TABS: 75.0000 mg | ORAL_TABLET | Freq: Every day | ORAL | Status: DC

## 2013-03-10 NOTE — Progress Notes (Signed)
Patient ID: Shelby Guerra, female   DOB: November 21, 1955, 57 y.o.   MRN: [id number] History of Present Illness: Shelby Guerra is a 57 year old black female single in for physical.   Current Medications, Allergies, Past Medical History, Past Surgical History, Family History and Social History were reviewed in [employer] electronic medical record.     Review of Systems: Patient denies any headaches, blurred vision, shortness of breath, chest pain, abdominal pain, problems with bowel movements, urination, or intercourse. She is not having sex.No joint pain or mood swings, still has some hot flashes but better with Effexor.    Physical Exam:BP 124/80  Pulse 76  Ht 5\' 1"  (1.549 m)  Wt 189 lb (85.73 kg)  BMI 35.73 kg/m2 General:  Well developed, well nourished, no acute distress Skin:  Warm and dry Neck:  Midline trachea, normal thyroid Lungs; Clear to auscultation bilaterally Breast:  No dominant palpable mass, retraction, or nipple discharge Cardiovascular: Regular rate and rhythm Abdomen:  Soft, non tender, no hepatosplenomegaly Pelvic:  External genitalia is normal in appearance.  The vagina is normal in appearance. The cervix and uterus are absent.  No adnexal masses or tenderness noted. Rectal: Good sphincter tone, no polyps, or hemorrhoids felt.  Hemoccult negative. Extremities:  No swelling or varicosities noted Psych:  No mood changes, alert and cooperative   Impression: Yearly gyn exam no pap Hot flashes    Plan: Physical in 1 year Mammogram yearly  Colonoscopy 2020 Get flu shot Labs with PCP

## 2013-03-10 NOTE — Patient Instructions (Addendum)
Physical in 1 year Mammogram yearly Labs with PCP Get flu shot

## 2013-04-02 ENCOUNTER — Ambulatory Visit (INDEPENDENT_AMBULATORY_CARE_PROVIDER_SITE_OTHER): Payer: BC Managed Care – PPO | Admitting: Family Medicine

## 2013-04-02 ENCOUNTER — Encounter: Payer: Self-pay | Admitting: Family Medicine

## 2013-04-02 VITALS — BP 122/86 | Ht 61.0 in | Wt 188.2 lb

## 2013-04-02 DIAGNOSIS — R300 Dysuria: Secondary | ICD-10-CM

## 2013-04-02 DIAGNOSIS — N390 Urinary tract infection, site not specified: Secondary | ICD-10-CM

## 2013-04-02 LAB — POCT URINALYSIS DIPSTICK: pH, UA: 6

## 2013-04-02 MED ORDER — NITROFURANTOIN MONOHYD MACRO 100 MG PO CAPS: 100.0000 mg | ORAL_CAPSULE | Freq: Two times a day (BID) | ORAL | Status: AC

## 2013-04-02 NOTE — Progress Notes (Signed)
  Subjective:    Patient ID: Shelby Guerra, female    DOB: November 23, 1955, 57 y.o.   MRN: [id number]  Dysuria  This is a new problem. The current episode started in the past 7 days. Associated symptoms include frequency and urgency. She has tried NSAIDs for the symptoms.   Intermittent dysuria. Increase frequency. Positive nocturia. Slight nausea. No fever no chills. Some low back pain. Gets 2-3 UTIs per year generally bladder only. ROS otherwise negative   Review of Systems  Genitourinary: Positive for dysuria, urgency and frequency.       Objective:   Physical Exam  Alert hydration good. Lungs clear. Heart regular in rhythm. H&T normal. No true CVA tenderness low back some pain to percussion  Urinalysis 8-10 whites per high-powered field      Assessment & Plan:  Impression urinary tract infections discussed multiple questions answered 15 minutes spent most in discussion. Plan Macrobid 100 twice a day 7 days symptomatic care discussed. No need for workup at this time rationale discussed. WSL

## 2013-04-21 ENCOUNTER — Telehealth: Payer: Self-pay

## 2013-04-21 NOTE — Telephone Encounter (Signed)
Pt takes prenatal vitamins and I told her the OTC would be fine

## 2013-07-07 ENCOUNTER — Encounter: Payer: Self-pay | Admitting: Family Medicine

## 2013-07-07 ENCOUNTER — Telehealth: Payer: Self-pay | Admitting: Family Medicine

## 2013-07-07 ENCOUNTER — Ambulatory Visit (INDEPENDENT_AMBULATORY_CARE_PROVIDER_SITE_OTHER): Payer: BC Managed Care – PPO | Admitting: Family Medicine

## 2013-07-07 VITALS — BP 128/90 | Temp 98.1°F | Ht 61.0 in | Wt 188.0 lb

## 2013-07-07 DIAGNOSIS — J09X2 Influenza due to identified novel influenza A virus with other respiratory manifestations: Secondary | ICD-10-CM

## 2013-07-07 MED ORDER — OSELTAMIVIR PHOSPHATE 75 MG PO CAPS: 75.0000 mg | ORAL_CAPSULE | Freq: Two times a day (BID) | ORAL | Status: DC

## 2013-07-07 MED ORDER — ONDANSETRON 4 MG PO TBDP: 4.0000 mg | ORAL_TABLET | Freq: Three times a day (TID) | ORAL | Status: DC | PRN

## 2013-07-07 NOTE — Telephone Encounter (Addendum)
Patient was given 2 Rx's on 07/07/13 and those particular Rx's are too expensive for her and she would like something else called in.     Shelby Guerra

## 2013-07-07 NOTE — Progress Notes (Signed)
   Subjective:    Patient ID: Shelby Guerra, female    DOB: 1955/10/23, 58 y.o.   MRN: [id number]  Cough This is a new problem. The current episode started yesterday. The cough is productive of sputum. Associated symptoms include chills, headaches, myalgias and rhinorrhea. Nothing aggravates the symptoms. She has tried OTC cough suppressant for the symptoms. The treatment provided mild relief.    Hit hard yest, felt cold and then chills  Progressed into headache  Severe cough,   Throat sore  No fever Feels real weak and no appetite achey allover muscles and joints  Felt fine two d ago  Took sinus pills and mucines  Review of Systems  Constitutional: Positive for chills.  HENT: Positive for rhinorrhea.   Respiratory: Positive for cough.   Musculoskeletal: Positive for myalgias.  Neurological: Positive for headaches.       Objective:   Physical Exam Alert moderate malaise. H&T moderate his congestion and cough during exam. Pharynx normal neck supple. Lungs clear. Heart regular in rhythm.       Assessment & Plan:  Impression influenza plan Tamiflu twice a day 5 days. Symptomatic care discussed. Work excuse given. WSL

## 2013-07-12 ENCOUNTER — Encounter (HOSPITAL_COMMUNITY): Payer: Self-pay | Admitting: Emergency Medicine

## 2013-07-12 ENCOUNTER — Emergency Department (HOSPITAL_COMMUNITY)
Admission: EM | Admit: 2013-07-12 | Discharge: 2013-07-12 | Disposition: A | Discharge status: Home / Self Care | Payer: BC Managed Care – PPO | Attending: Emergency Medicine | Admitting: Emergency Medicine

## 2013-07-12 DIAGNOSIS — K5289 Other specified noninfective gastroenteritis and colitis: Secondary | ICD-10-CM | POA: Insufficient documentation

## 2013-07-12 DIAGNOSIS — Z9889 Other specified postprocedural states: Secondary | ICD-10-CM | POA: Insufficient documentation

## 2013-07-12 DIAGNOSIS — E7800 Pure hypercholesterolemia, unspecified: Secondary | ICD-10-CM | POA: Insufficient documentation

## 2013-07-12 DIAGNOSIS — K529 Noninfective gastroenteritis and colitis, unspecified: Secondary | ICD-10-CM

## 2013-07-12 DIAGNOSIS — R6883 Chills (without fever): Secondary | ICD-10-CM | POA: Insufficient documentation

## 2013-07-12 DIAGNOSIS — Z79899 Other long term (current) drug therapy: Secondary | ICD-10-CM | POA: Insufficient documentation

## 2013-07-12 DIAGNOSIS — Z90710 Acquired absence of both cervix and uterus: Secondary | ICD-10-CM | POA: Insufficient documentation

## 2013-07-12 DIAGNOSIS — Z8742 Personal history of other diseases of the female genital tract: Secondary | ICD-10-CM | POA: Insufficient documentation

## 2013-07-12 LAB — CBC WITH DIFFERENTIAL/PLATELET
BASOS ABS: 0 10*3/uL (ref 0.0–0.1)
BASOS PCT: 0 % (ref 0–1)
Eosinophils Absolute: 0.1 10*3/uL (ref 0.0–0.7)
Eosinophils Relative: 1 % (ref 0–5)
HEMATOCRIT: 40.6 % (ref 36.0–46.0)
Hemoglobin: 13.4 g/dL (ref 12.0–15.0)
Lymphocytes Relative: 42 % (ref 12–46)
Lymphs Abs: 2.3 10*3/uL (ref 0.7–4.0)
MCH: 28.3 pg (ref 26.0–34.0)
MCHC: 33 g/dL (ref 30.0–36.0)
MCV: 85.8 fL (ref 78.0–100.0)
MONOS PCT: 11 % (ref 3–12)
Monocytes Absolute: 0.6 10*3/uL (ref 0.1–1.0)
NEUTROS ABS: 2.5 10*3/uL (ref 1.7–7.7)
Neutrophils Relative %: 46 % (ref 43–77)
PLATELETS: 250 10*3/uL (ref 150–400)
RBC: 4.73 MIL/uL (ref 3.87–5.11)
RDW: 13.7 % (ref 11.5–15.5)
WBC: 5.4 10*3/uL (ref 4.0–10.5)

## 2013-07-12 LAB — COMPREHENSIVE METABOLIC PANEL
ALBUMIN: 3.9 g/dL (ref 3.5–5.2)
ALK PHOS: 88 U/L (ref 39–117)
ALT: 13 U/L (ref 0–35)
AST: 18 U/L (ref 0–37)
BUN: 18 mg/dL (ref 6–23)
CHLORIDE: 101 meq/L (ref 96–112)
CO2: 25 mEq/L (ref 19–32)
Calcium: 9.3 mg/dL (ref 8.4–10.5)
Creatinine, Ser: 0.9 mg/dL (ref 0.50–1.10)
GFR calc Af Amer: 81 mL/min — ABNORMAL LOW (ref >90)
GFR calc non Af Amer: 70 mL/min — ABNORMAL LOW (ref >90)
Glucose, Bld: 100 mg/dL — ABNORMAL HIGH (ref 70–99)
POTASSIUM: 4.2 meq/L (ref 3.7–5.3)
Sodium: 139 mEq/L (ref 137–147)
Total Protein: 7.4 g/dL (ref 6.0–8.3)

## 2013-07-12 MED ORDER — SODIUM CHLORIDE 0.9 % IV BOLUS (SEPSIS)
1000.0000 mL | Freq: Once | INTRAVENOUS | Status: AC
  Administered 2013-07-12: 1000 mL via INTRAVENOUS

## 2013-07-12 MED ORDER — ONDANSETRON HCL 4 MG/2ML IJ SOLN
4.0000 mg | Freq: Once | INTRAMUSCULAR | Status: AC
  Administered 2013-07-12: 4 mg via INTRAVENOUS
  Filled 2013-07-12: qty 2

## 2013-07-12 NOTE — Discharge Instructions (Signed)
Drink plenty of fluids.  Follow up with your md if not improving. °

## 2013-07-12 NOTE — ED Notes (Signed)
Patient c/o lower abd pain with nausea, vomiting, and diarrhea. Per patient seen by Dr Wolfgang Phoenix on Wednesday and diagnosed with flu. Patient unsure of any fevers. Reports frequency in urination.

## 2013-07-12 NOTE — ED Provider Notes (Signed)
CSN: [id number]     Arrival date & time 07/12/13  1811 History  This chart was scribed for Maudry Diego, MD by Zettie Pho, ED Scribe. This patient was seen in room APA05/APA05 and the patient's care was started at 7:20 PM.    Chief Complaint  Patient presents with  . Abdominal Pain   Patient is a 58 y.o. female presenting with abdominal pain. The history is provided by the patient. No language interpreter was used.  Abdominal Pain Pain location:  Suprapubic Pain radiates to:  Does not radiate Pain severity:  Mild Onset quality:  Gradual Timing:  Constant Progression:  Unchanged Chronicity:  New Context: recent illness   Context: not sick contacts   Associated symptoms: chills, diarrhea, nausea and vomiting   Associated symptoms: no chest pain, no cough, no fatigue, no fever and no hematuria   Diarrhea:    Quality:  Watery   Severity:  Mild   Timing:  Sporadic   Progression:  Unchanged Nausea:    Severity:  Mild   Onset quality:  Gradual   Timing:  Intermittent   Progression:  Unchanged Risk factors: multiple surgeries    HPI Comments: Shelby Guerra is a 58 y.o. female who presents to the Emergency Department complaining of a constant, mild pain to the suprapubic region of the abdomen with associated nausea, emesis, diarrhea, and chills onset 2-3 days ago. Patient states that she was seen last week by her PCP and diagnosed with the flu. She denies fever. She denies any known sick contacts. Patient has a history of abdominal hysterectomy, inguinal hernia repair, and tubal ligation.   Past Medical History  Diagnosis Date  . High cholesterol   . Hot flashes 03/10/2013   Past Surgical History  Procedure Laterality Date  . Colonoscopy    12/13/2008    Normal rectum and colon  . Abdominal hysterectomy    . Inguinal hernia repair    . Tubal ligation     Family History  Problem Relation Age of Onset  . Hypertension Brother   . Hypertension Sister   . Kidney failure  Sister   . Hypertension Brother    History  Substance Use Topics  . Smoking status: Never Smoker   . Smokeless tobacco: Never Used  . Alcohol Use: No   OB History   Grav Para Term Preterm Abortions TAB SAB Ect Mult Living   1 1        1      Review of Systems  Constitutional: Positive for chills. Negative for fever, appetite change and fatigue.  HENT: Negative for congestion, ear discharge and sinus pressure.   Eyes: Negative for discharge.  Respiratory: Negative for cough.   Cardiovascular: Negative for chest pain.  Gastrointestinal: Positive for nausea, vomiting, abdominal pain and diarrhea.  Genitourinary: Negative for frequency and hematuria.  Musculoskeletal: Negative for back pain.  Skin: Negative for rash.  Neurological: Negative for seizures and headaches.  Psychiatric/Behavioral: Negative for hallucinations.    Allergies  Codeine and Propoxyphene n-acetaminophen  Home Medications   Current Outpatient Rx  Name  Route  Sig  Dispense  Refill  . ondansetron (ZOFRAN ODT) 4 MG disintegrating tablet   Oral   Take 1 tablet (4 mg total) by mouth every 8 (eight) hours as needed for nausea or vomiting.   20 tablet   0   . oseltamivir (TAMIFLU) 75 MG capsule   Oral   Take 1 capsule (75 mg total) by mouth 2 (two)  times daily.   10 capsule   0   . venlafaxine XR (EFFEXOR-XR) 75 MG 24 hr capsule   Oral   Take 75 mg by mouth daily.          Triage Vitals: BP 126/81  Pulse 61  Temp(Src) 97.6 F (36.4 C) (Oral)  Resp 20  Ht 5\' 1"  (1.549 m)  Wt 180 lb (81.647 kg)  BMI 34.03 kg/m2  SpO2 100%  Physical Exam  Constitutional: She is oriented to person, place, and time. She appears well-developed.  HENT:  Head: Normocephalic.  Eyes: Conjunctivae and EOM are normal. No scleral icterus.  Neck: Neck supple. No thyromegaly present.  Cardiovascular: Normal rate and regular rhythm.  Exam reveals no gallop and no friction rub.   No murmur heard. Pulmonary/Chest: No  stridor. She has no wheezes. She has no rales. She exhibits no tenderness.  Abdominal: She exhibits no distension. There is tenderness. There is no rebound.  Mild, diffuse tenderness to palpation.   Musculoskeletal: Normal range of motion. She exhibits no edema.  Lymphadenopathy:    She has no cervical adenopathy.  Neurological: She is oriented to person, place, and time. She exhibits normal muscle tone. Coordination normal.  Skin: No rash noted. No erythema.  Psychiatric: She has a normal mood and affect. Her behavior is normal.    ED Course  Procedures (including critical care time)  DIAGNOSTIC STUDIES: Oxygen Saturation is 100% on room air, normal by my interpretation.    COORDINATION OF CARE: 7:25 PM- Will order blood labs. Will order IV fluids and Zofran to manage symptoms. Discussed treatment plan with patient at bedside and patient verbalized agreement.   9:01 PM- Discussed that lab results were normal and that symptoms are likely viral in nature. Advised patient to follow up with her PCP next week. Discussed treatment plan with patient at bedside and patient verbalized agreement.    Labs Review Labs Reviewed  COMPREHENSIVE METABOLIC PANEL - Abnormal; Notable for the following:    Glucose, Bld 100 (*)    Total Bilirubin <0.2 (*)    GFR calc non Af Amer 70 (*)    GFR calc Af Amer 81 (*)    All other components within normal limits  CBC WITH DIFFERENTIAL   Imaging Review No results found.  EKG Interpretation   None       MDM  Gastroenteritis        The chart was scribed for me under my direct supervision.  I personally performed the history, physical, and medical decision making and all procedures in the evaluation of this patient.Maudry Diego, MD 07/12/13 2105

## 2013-07-13 NOTE — Telephone Encounter (Signed)
Pt said she "took care of it" and did not need anything else.

## 2013-07-16 DIAGNOSIS — Z0289 Encounter for other administrative examinations: Secondary | ICD-10-CM

## 2013-07-29 ENCOUNTER — Encounter: Payer: Self-pay | Admitting: Gastroenterology

## 2013-07-29 ENCOUNTER — Ambulatory Visit (INDEPENDENT_AMBULATORY_CARE_PROVIDER_SITE_OTHER): Payer: BC Managed Care – PPO | Admitting: Gastroenterology

## 2013-07-29 VITALS — BP 116/70 | HR 75 | Temp 97.9°F | Wt 189.6 lb

## 2013-07-29 DIAGNOSIS — K219 Gastro-esophageal reflux disease without esophagitis: Secondary | ICD-10-CM

## 2013-07-29 DIAGNOSIS — K5909 Other constipation: Secondary | ICD-10-CM

## 2013-07-29 MED ORDER — PANTOPRAZOLE SODIUM 40 MG PO TBEC: 40.0000 mg | DELAYED_RELEASE_TABLET | Freq: Every day | ORAL | Status: DC

## 2013-07-29 MED ORDER — LINACLOTIDE 145 MCG PO CAPS: 145.0000 ug | ORAL_CAPSULE | Freq: Every day | ORAL | Status: DC

## 2013-07-29 NOTE — Progress Notes (Signed)
.      Primary Care Physician:  Rubbie Battiest, MD Primary Gastroenterologist:  Dr. Gala Romney   Chief Complaint  Patient presents with  . Gastrophageal Reflux    HPI:   Shelby Guerra presents today in ED follow-up after seen for what she states were reflux issues. According to the ED note, patient was complaining of N/V/D.  She has a history of chronic GERD and constipation. She is back at baseline constipation. Notes retrosternal discomfort, aching and burning usually at night and in the mornings. Feels like reflux. Intermittent nausea, sometimes better after eating. Nausea upon waking. Coffee worsens. No melena. No dysphagia. No vomiting. BM usually once to twice a week. Not regular. No rectal bleeding. Sometimes lower abdominal discomfort with constipation. Aleve at night for joint pain.   Prior reflux medications: Prilosec with only some improvement.  Past Medical History  Diagnosis Date  . High cholesterol   . Hot flashes 03/10/2013  . GERD (gastroesophageal reflux disease)     Past Surgical History  Procedure Laterality Date  . Colonoscopy    12/13/2008    Dr. Gala Romney: Normal rectum and colon  . Abdominal hysterectomy    . Inguinal hernia repair    . Tubal ligation      Current Outpatient Prescriptions  Medication Sig Dispense Refill  . venlafaxine XR (EFFEXOR-XR) 75 MG 24 hr capsule Take 75 mg by mouth daily.      . Linaclotide (LINZESS) 145 MCG CAPS capsule Take 1 capsule (145 mcg total) by mouth daily. Take 30 minutes prior to breakfast.  30 capsule  5  . pantoprazole (PROTONIX) 40 MG tablet Take 1 tablet (40 mg total) by mouth daily. Take 30 minutes before breakfast.  30 tablet  3   No current facility-administered medications for this visit.    Allergies as of 07/29/2013 - Review Complete 07/12/2013  Allergen Reaction Noted  . Codeine    . Propoxyphene n-acetaminophen Itching     Family History  Problem Relation Age of Onset  . Hypertension Brother   .  Hypertension Sister   . Kidney failure Sister   . Hypertension Brother   . Colon cancer Neg Hx     History   Social History  . Marital Status: Single    Spouse Name: N/A    Number of Children: N/A  . Years of Education: N/A   Occupational History  . Not on file.   Social History Main Topics  . Smoking status: Never Smoker   . Smokeless tobacco: Never Used  . Alcohol Use: No  . Drug Use: No  . Sexual Activity: No   Other Topics Concern  . Not on file   Social History Narrative  . No narrative on file    Review of Systems: Negative as mentioned in HPI.   Physical Exam: BP 116/70  Pulse 75  Temp(Src) 97.9 F (36.6 C) (Oral)  Wt 189 lb 9.6 oz (86.002 kg) General:   Alert and oriented. Pleasant and cooperative. Well-nourished and well-developed.  Head:  Normocephalic and atraumatic. Eyes:  Without icterus, sclera clear and conjunctiva pink.  Ears:  Normal auditory acuity. Nose:  No deformity, discharge,  or lesions. Mouth:  No deformity or lesions, oral mucosa pink.  Neck:  Supple, without mass or thyromegaly. Lungs:  Clear to auscultation bilaterally. No wheezes, rales, or rhonchi. No distress.  Heart:  S1, S2 present without murmurs appreciated.  Abdomen:  +BS, soft, non-tender and non-distended. No HSM noted. No guarding or  rebound. No masses appreciated.  Rectal:  Deferred  Msk:  Symmetrical without gross deformities. Normal posture. Extremities:  Without clubbing or edema. Neurologic:  Alert and  oriented x4;  grossly normal neurologically. Skin:  Intact without significant lesions or rashes. Cervical Nodes:  No significant cervical adenopathy. Psych:  Alert and cooperative. Normal mood and affect.

## 2013-07-29 NOTE — Assessment & Plan Note (Signed)
58 year old female with chronic GERD, now with associated nausea and retrosternal discomfort likely due to untreated/uncontrolled GERD. No alarm symptoms currently. Will trial Protonix and have patient call in about 10-14 days. If no improvement in symptoms, she will need an EGD. She states understanding regarding this. Risks and benefits discussed in detail with patient. Otherwise, return in 6 weeks for routine follow-up.

## 2013-07-29 NOTE — Patient Instructions (Signed)
Take Protonix each morning, 30 minutes before breakfast. Call us in 10-14 days with an update; if no improvement in nausea and reflux, we will need to do an upper endoscopy.   For constipation: start taking Linzess 1 capsule each morning, 30 minutes before breakfast. We have provided a sample voucher and sent refills to your pharmacy.   Diet for Gastroesophageal Reflux Disease, Adult Reflux (acid reflux) is when acid from your stomach flows up into the esophagus. When acid comes in contact with the esophagus, the acid causes irritation and soreness (inflammation) in the esophagus. When reflux happens often or so severely that it causes damage to the esophagus, it is called gastroesophageal reflux disease (GERD). Nutrition therapy can help ease the discomfort of GERD. FOODS OR DRINKS TO AVOID OR LIMIT  Smoking or chewing tobacco. Nicotine is one of the most potent stimulants to acid production in the gastrointestinal tract.  Caffeinated and decaffeinated coffee and black tea.  Regular or low-calorie carbonated beverages or energy drinks (caffeine-free carbonated beverages are allowed).   Strong spices, such as black pepper, white pepper, red pepper, cayenne, curry powder, and chili powder.  Peppermint or spearmint.  Chocolate.  High-fat foods, including meats and fried foods. Extra added fats including oils, butter, salad dressings, and nuts. Limit these to less than 8 tsp per day.  Fruits and vegetables if they are not tolerated, such as citrus fruits or tomatoes.  Alcohol.  Any food that seems to aggravate your condition. If you have questions regarding your diet, call your caregiver or a registered dietitian. OTHER THINGS THAT MAY HELP GERD INCLUDE:   Eating your meals slowly, in a relaxed setting.  Eating 5 to 6 small meals per day instead of 3 large meals.  Eliminating food for a period of time if it causes distress.  Not lying down until 3 hours after eating a  meal.  Keeping the head of your bed raised 6 to 9 inches (15 to 23 cm) by using a foam wedge or blocks under the legs of the bed. Lying flat may make symptoms worse.  Being physically active. Weight loss may be helpful in reducing reflux in overweight or obese adults.  Wear loose fitting clothing EXAMPLE MEAL PLAN This meal plan is approximately 2,000 calories based on [email or web address] meal planning guidelines. Breakfast   cup cooked oatmeal.  1 cup strawberries.  1 cup low-fat milk.  1 oz almonds. Snack  1 cup cucumber slices.  6 oz yogurt (made from low-fat or fat-free milk). Lunch  2 slice whole-wheat bread.  2 oz sliced [place].  2 tsp mayonnaise.  1 cup blueberries.  1 cup snap peas. Snack  6 whole-wheat crackers.  1 oz string cheese. Dinner   cup brown rice.  1 cup mixed veggies.  1 tsp olive oil.  3 oz grilled fish. Document Released: 05/27/2005 Document Revised: 08/19/2011 Document Reviewed: 04/12/2011 [facility] Patient Information 2014 [place], [place].

## 2013-07-29 NOTE — Assessment & Plan Note (Signed)
Last colonoscopy in 2010: normal. No concerning lower GI symptoms. Trial Linzess 145 mcg daily. Return in 6 weeks.

## 2013-08-04 NOTE — Progress Notes (Signed)
cc'd to pcp 

## 2013-09-07 ENCOUNTER — Encounter: Payer: Self-pay | Admitting: Family Medicine

## 2013-09-07 ENCOUNTER — Ambulatory Visit (INDEPENDENT_AMBULATORY_CARE_PROVIDER_SITE_OTHER): Payer: BC Managed Care – PPO | Admitting: Family Medicine

## 2013-09-07 VITALS — BP 128/80 | Temp 98.4°F | Ht 61.5 in | Wt 187.0 lb

## 2013-09-07 DIAGNOSIS — M545 Low back pain, unspecified: Secondary | ICD-10-CM

## 2013-09-07 LAB — POCT URINALYSIS DIPSTICK
PH UA: 5
SPEC GRAV UA: 1.025

## 2013-09-07 MED ORDER — CHLORZOXAZONE 500 MG PO TABS: 500.0000 mg | ORAL_TABLET | Freq: Three times a day (TID) | ORAL | Status: DC | PRN

## 2013-09-07 MED ORDER — ETODOLAC 400 MG PO TABS: 400.0000 mg | ORAL_TABLET | Freq: Two times a day (BID) | ORAL | Status: DC

## 2013-09-07 NOTE — Progress Notes (Signed)
   Subjective:    Patient ID: Shelby Guerra, female    DOB: Jun 16, 1955, 58 y.o.   MRN: [id number]  Back Pain This is a new problem. The current episode started in the past 7 days. The symptoms are aggravated by bending and twisting. She has tried NSAIDs for the symptoms. The treatment provided no relief.    Cough, runny nose. Started yesterday.   Diffuse low back   Spasms intermittently  Pain severe at times  Very uncomfortable  Tried ibu took four eased a bit  Lifting on the job Tried to work this [employer] and could not   Stay in the [place] and not radiating Coughing up phlegm  Eyes running, eye drops prn at [employer], took eyedrops  Review of Systems  Musculoskeletal: Positive for back pain.   Possible slight dysuria no fever no chills ROS otherwise negative    Objective:   Physical Exam  Alert mild malaise. Diffuse lumbar tenderness. Negative straight leg raise. Lungs clear. Heart regular in rhythm.      Assessment & Plan:  Impression lumbar strain discussed plan [place] twice a day with food when necessary. Chlorzoxazone when necessary. Local measures discussed. Work excuse written. WSL

## 2013-09-07 NOTE — Patient Instructions (Signed)
Try to do several times per wk  Use daily claritin and rob dm for this either mild cold or likely allergies Back Exercises Back exercises help treat and prevent back injuries. The goal of back exercises is to increase the strength of your abdominal and back muscles and the flexibility of your back. These exercises should be started when you no longer have back pain. Back exercises include:  Pelvic Tilt. Lie on your back with your knees bent. Tilt your pelvis until the lower part of your back is against the floor. Hold this position 5 to 10 sec and repeat 5 to 10 times.  Knee to Chest. Pull first 1 knee up against your chest and hold for 20 to 30 seconds, repeat this with the other knee, and then both knees. This may be done with the other leg straight or bent, whichever feels better.  Sit-Ups or Curl-Ups. Bend your knees 90 degrees. Start with tilting your pelvis, and do a partial, slow sit-up, lifting your trunk only 30 to 45 degrees off the floor. Take at least 2 to 3 seconds for each sit-up. Do not do sit-ups with your knees out straight. If partial sit-ups are difficult, simply do the above but with only tightening your abdominal muscles and holding it as directed.  Hip-Lift. Lie on your back with your knees flexed 90 degrees. Push down with your feet and shoulders as you raise your hips a couple inches off the floor; hold for 10 seconds, repeat 5 to 10 times.  Back arches. Lie on your stomach, propping yourself up on bent elbows. Slowly press on your hands, causing an arch in your low back. Repeat 3 to 5 times. Any initial stiffness and discomfort should lessen with repetition over time.  Shoulder-Lifts. Lie face down with arms beside your body. Keep hips and torso pressed to floor as you slowly lift your head and shoulders off the floor. Do not overdo your exercises, especially in the beginning. Exercises may cause you some mild back discomfort which lasts for a few minutes; however, if the  pain is more severe, or lasts for more than 15 minutes, do not continue exercises until you see your caregiver. Improvement with exercise therapy for back problems is slow.  See your caregivers for assistance with developing a proper back exercise program. Document Released: 07/04/2004 Document Revised: 08/19/2011 Document Reviewed: 03/28/2011 [facility] Patient Information 2014 [place].

## 2013-09-15 DIAGNOSIS — Z0289 Encounter for other administrative examinations: Secondary | ICD-10-CM

## 2013-09-16 ENCOUNTER — Telehealth: Payer: Self-pay | Admitting: Gastroenterology

## 2013-09-16 ENCOUNTER — Ambulatory Visit: Payer: BC Managed Care – PPO | Admitting: Gastroenterology

## 2013-09-16 NOTE — Telephone Encounter (Signed)
Please send letter.

## 2013-09-16 NOTE — Telephone Encounter (Signed)
Pt was a no show

## 2013-09-20 ENCOUNTER — Encounter: Payer: Self-pay | Admitting: Gastroenterology

## 2013-09-20 NOTE — Telephone Encounter (Signed)
MAILED LETTER °

## 2013-10-25 ENCOUNTER — Other Ambulatory Visit: Payer: Self-pay | Admitting: Family Medicine

## 2013-10-25 DIAGNOSIS — Z139 Encounter for screening, unspecified: Secondary | ICD-10-CM

## 2013-11-22 ENCOUNTER — Ambulatory Visit (HOSPITAL_COMMUNITY)
Admission: RE | Admit: 2013-11-22 | Discharge: 2013-11-22 | Disposition: A | Discharge status: Home / Self Care | Payer: BC Managed Care – PPO | Source: Ambulatory Visit | Attending: Family Medicine | Admitting: Family Medicine

## 2013-11-22 DIAGNOSIS — Z1231 Encounter for screening mammogram for malignant neoplasm of breast: Secondary | ICD-10-CM | POA: Insufficient documentation

## 2013-11-22 DIAGNOSIS — Z139 Encounter for screening, unspecified: Secondary | ICD-10-CM

## 2013-12-15 ENCOUNTER — Other Ambulatory Visit: Payer: Self-pay | Admitting: Family Medicine

## 2013-12-15 NOTE — Telephone Encounter (Signed)
Ok plus two ref 

## 2013-12-22 ENCOUNTER — Ambulatory Visit (INDEPENDENT_AMBULATORY_CARE_PROVIDER_SITE_OTHER): Payer: BC Managed Care – PPO | Admitting: Family Medicine

## 2013-12-22 ENCOUNTER — Encounter: Payer: Self-pay | Admitting: Family Medicine

## 2013-12-22 VITALS — Ht 61.5 in

## 2013-12-22 DIAGNOSIS — M79609 Pain in unspecified limb: Secondary | ICD-10-CM

## 2013-12-22 DIAGNOSIS — Z13220 Encounter for screening for lipoid disorders: Secondary | ICD-10-CM

## 2013-12-22 DIAGNOSIS — M79605 Pain in left leg: Secondary | ICD-10-CM

## 2013-12-22 MED ORDER — MELOXICAM 15 MG PO TABS
15.0000 mg | ORAL_TABLET | Freq: Every day | ORAL | Status: DC
Start: 2013-12-22 — End: 2014-11-14

## 2013-12-22 NOTE — Progress Notes (Signed)
   Subjective:    Patient ID: Shelby Guerra, female    DOB: 04-25-1956, 58 y.o.   MRN: [id number]  HPI Patient arrives for with complaint of leg cramps for a while- left leg is worse and worse at night. Hurts with standing Also with sitting Worse at nights Initially patient states she was having leg cramps she states that this been going on for a couple months but upon further questioning the real problem then she's been having pain in her left knee that radiates down the left side of her leg. Sometimes radiates down the back of the thigh. She denies any numbness or tingling. No loss of bowel or bladder control.  She denies any injury with this. It's worse when she stands at work she has to stand for 10 hours at a time at least 5-6 days a week. She has had x-rays in the past which showed degenerative changes in her back. Review of Systems    she relates leg pain see above no known injury. No numbness or tingling. Denies weakness. Objective:   Physical Exam  No crepitus noted in the knees. She has brisk reflex on the left normal on the right strength is normal bilateral no swelling. She has subjective discomfort around the lateral aspect of the left knee along the lateral aspect of her left leg negative pain in the back.      Assessment & Plan:  Left leg pain probable sciatica no need for any MRI at this point. Try Mobic. Tylenol if need be. Hold off on narcotics. Recheck 4 weeks. May need either EMG or MRI if persistent. Patient knows to followup sooner if worse

## 2013-12-25 LAB — BASIC METABOLIC PANEL
BUN: 18 mg/dL (ref 6–23)
CO2: 28 mEq/L (ref 19–32)
Calcium: 9 mg/dL (ref 8.4–10.5)
Chloride: 106 mEq/L (ref 96–112)
Creat: 0.78 mg/dL (ref 0.50–1.10)
Glucose, Bld: 86 mg/dL (ref 70–99)
POTASSIUM: 4.7 meq/L (ref 3.5–5.3)
SODIUM: 140 meq/L (ref 135–145)

## 2013-12-25 LAB — LIPID PANEL
CHOL/HDL RATIO: 3.5 ratio
Cholesterol: 173 mg/dL (ref 0–200)
HDL: 49 mg/dL (ref >39)
LDL Cholesterol: 114 mg/dL — ABNORMAL HIGH (ref 0–99)
Triglycerides: 52 mg/dL (ref <150)
VLDL: 10 mg/dL (ref 0–40)

## 2013-12-26 ENCOUNTER — Encounter: Payer: Self-pay | Admitting: Family Medicine

## 2014-01-11 ENCOUNTER — Encounter: Payer: Self-pay | Admitting: Family Medicine

## 2014-01-11 ENCOUNTER — Ambulatory Visit (INDEPENDENT_AMBULATORY_CARE_PROVIDER_SITE_OTHER): Payer: BC Managed Care – PPO | Admitting: Family Medicine

## 2014-01-11 VITALS — BP 132/80 | Temp 98.0°F | Ht 61.0 in | Wt 184.0 lb

## 2014-01-11 DIAGNOSIS — A084 Viral intestinal infection, unspecified: Secondary | ICD-10-CM

## 2014-01-11 DIAGNOSIS — A088 Other specified intestinal infections: Secondary | ICD-10-CM

## 2014-01-11 MED ORDER — DIPHENOXYLATE-ATROPINE 2.5-0.025 MG PO TABS: 1.0000 | ORAL_TABLET | Freq: Four times a day (QID) | ORAL | Status: DC | PRN

## 2014-01-11 MED ORDER — ONDANSETRON 4 MG PO TBDP: 4.0000 mg | ORAL_TABLET | Freq: Four times a day (QID) | ORAL | Status: DC | PRN

## 2014-01-11 NOTE — Progress Notes (Signed)
   Subjective:    Patient ID: Shelby Guerra, female    DOB: 1955-08-22, 58 y.o.   MRN: [id number]  Emesis  This is a new problem. The current episode started yesterday. Associated symptoms include chills, diarrhea and headaches. Associated symptoms comments: Sore throat, Nausea.   Diminished energy cold and chills at times  vomitid times four  No new or unusual foods  Uncomfortable, crampy in nature  No new meds   Review of Systems  Constitutional: Positive for chills.  Gastrointestinal: Positive for vomiting and diarrhea.  Neurological: Positive for headaches.       Objective:   Physical Exam  Alert mild malaise. HEENT normal lungs clear heart regular rate rhythm abdomen. Good bowel sounds no discrete tenderness     Assessment & Plan:  Impression probable viral gastroenteritis discussed plan symptomatic care discussed. Zofran when necessary for nausea. Lomotil when necessary for diarrhea dietary changes discussed. WSL

## 2014-01-17 ENCOUNTER — Telehealth: Payer: Self-pay | Admitting: Family Medicine

## 2014-01-17 ENCOUNTER — Encounter: Payer: Self-pay | Admitting: Family Medicine

## 2014-01-17 DIAGNOSIS — Z0289 Encounter for other administrative examinations: Secondary | ICD-10-CM

## 2014-01-17 NOTE — Telephone Encounter (Signed)
Discussed with patient. plesase give work excuse for today and tomorrow. Return on Wednesday.

## 2014-01-17 NOTE — Telephone Encounter (Signed)
Patient seen on 01/11/14 for virus and is still having stomach pains and a little diarrhea. She went to work today and was sent home because she may have went back to soon. Please advise. Also, needs work excuse extended.

## 2014-01-17 NOTE — Telephone Encounter (Signed)
done

## 2014-01-17 NOTE — Telephone Encounter (Signed)
Seen 08/04. Dx viral gastroenteritis

## 2014-01-17 NOTE — Telephone Encounter (Signed)
Couple more d on w e ck in off if persists

## 2014-01-19 ENCOUNTER — Ambulatory Visit: Payer: BC Managed Care – PPO | Admitting: Family Medicine

## 2014-03-01 ENCOUNTER — Ambulatory Visit (INDEPENDENT_AMBULATORY_CARE_PROVIDER_SITE_OTHER): Payer: BC Managed Care – PPO | Admitting: Family Medicine

## 2014-03-01 ENCOUNTER — Telehealth: Payer: Self-pay | Admitting: Family Medicine

## 2014-03-01 ENCOUNTER — Encounter: Payer: Self-pay | Admitting: Family Medicine

## 2014-03-01 VITALS — BP 112/80 | Temp 98.4°F | Ht 61.5 in | Wt 185.4 lb

## 2014-03-01 DIAGNOSIS — R109 Unspecified abdominal pain: Secondary | ICD-10-CM

## 2014-03-01 DIAGNOSIS — N3000 Acute cystitis without hematuria: Secondary | ICD-10-CM

## 2014-03-01 LAB — POCT URINALYSIS DIPSTICK
RBC UA: 10
pH, UA: 6

## 2014-03-01 MED ORDER — PHENAZOPYRIDINE HCL 200 MG PO TABS: 200.0000 mg | ORAL_TABLET | Freq: Three times a day (TID) | ORAL | Status: DC | PRN

## 2014-03-01 MED ORDER — CIPROFLOXACIN HCL 250 MG PO TABS: 250.0000 mg | ORAL_TABLET | Freq: Two times a day (BID) | ORAL | Status: DC

## 2014-03-01 MED ORDER — FLUCONAZOLE 150 MG PO TABS: 150.0000 mg | ORAL_TABLET | Freq: Once | ORAL | Status: DC

## 2014-03-01 NOTE — Telephone Encounter (Signed)
error 

## 2014-03-01 NOTE — Progress Notes (Signed)
   Subjective:    Patient ID: Shelby Guerra, female    DOB: 1956/04/13, 58 y.o.   MRN: [id number]  Urinary Tract Infection  This is a new problem. The current episode started today. The problem occurs every urination. The problem has been unchanged. The pain is moderate. There has been no fever. Associated symptoms include flank pain and frequency. She has tried nothing for the symptoms. The treatment provided no relief.    she relates this came on over the past few daysnow having frequency and lower abdominal discomfort   Review of Systems  Genitourinary: Positive for frequency and flank pain.      urinalysis with WBCs Objective:   Physical Exam  Lower abdomen slightly tender flanks nontender lungs clear heart regular pulse normal      Assessment & Plan:  Urinary tract infection antibiotics prescribed peridium prescribed I doubt she has pyelonephritis if she starts getting severe flank pain fever chills sweats she needs to followup immediately

## 2014-03-04 LAB — URINE CULTURE: Colony Count: 25000

## 2014-03-14 ENCOUNTER — Encounter: Payer: Self-pay | Admitting: Adult Health

## 2014-03-14 ENCOUNTER — Ambulatory Visit (INDEPENDENT_AMBULATORY_CARE_PROVIDER_SITE_OTHER): Payer: BC Managed Care – PPO | Admitting: Adult Health

## 2014-03-14 VITALS — BP 126/78 | HR 74 | Ht 61.25 in | Wt 186.0 lb

## 2014-03-14 DIAGNOSIS — R4589 Other symptoms and signs involving emotional state: Secondary | ICD-10-CM

## 2014-03-14 DIAGNOSIS — R232 Flushing: Secondary | ICD-10-CM

## 2014-03-14 DIAGNOSIS — Z1212 Encounter for screening for malignant neoplasm of rectum: Secondary | ICD-10-CM

## 2014-03-14 DIAGNOSIS — Z01419 Encounter for gynecological examination (general) (routine) without abnormal findings: Secondary | ICD-10-CM

## 2014-03-14 HISTORY — DX: Other symptoms and signs involving emotional state: R45.89

## 2014-03-14 LAB — HEMOCCULT GUIAC POC 1CARD (OFFICE): FECAL OCCULT BLD: NEGATIVE

## 2014-03-14 MED ORDER — VENLAFAXINE HCL ER 150 MG PO CP24: 150.0000 mg | ORAL_CAPSULE | Freq: Every day | ORAL | Status: DC

## 2014-03-14 NOTE — Patient Instructions (Signed)
Will increase effexor to 150 mg Physical in 1 year Mammogram yearly Colonoscopy per Dr Marcine Matar with PCP

## 2014-03-14 NOTE — Progress Notes (Signed)
Patient ID: Shelby Guerra, female   DOB: 07-03-1955, 58 y.o.   MRN: [id number] History of Present Illness: Shelby Guerra is a 58 year old black female,single, in for a gyn physical and is still having hot flashes and is moody, is taking 75 mg of Effexor.   Current Medications, Allergies, Past Medical History, Past Surgical History, Family History and Social History were reviewed in [employer] record.     Review of Systems: Patient denies any headaches, blurred vision, shortness of breath, chest pain, abdominal pain, problems with bowel movements, urination( UTI recently treated with Cipro), or intercourse. Not having sex, has pain in left knee at times taking mobic.See HPI for positives.  Physical Exam:BP 126/78  Pulse 74  Ht 5' 1.25" (1.556 m)  Wt 186 lb (84.369 kg)  BMI 34.85 kg/m2 General:  Well developed, well nourished, no acute distress Skin:  Warm and dry Neck:  Midline trachea, normal thyroid Lungs; Clear to auscultation bilaterally Breast:  No dominant palpable mass, retraction, or nipple discharge, has inverted left nipple, had normal mammogram n June Cardiovascular: Regular rate and rhythm Abdomen:  Soft, non tender, no hepatosplenomegaly Pelvic:  External genitalia is normal in appearance.  The vagina has good color, with loss of rugae and moisture. The cervix and uterus are absent. No adnexal masses or tenderness noted. Rectal: Good sphincter tone, no polyps, or hemorrhoids felt.  Hemoccult negative. Extremities:  No swelling or varicosities noted Psych:  Alert and cooperative,seems happy, is working 12 hour shifts   Impression: Well woman gyn exam no pap Hot flashes  Moody     Plan: Increase Effexor to 150 mg daily Rx Effexor XR 150 mg #30 1 daily with 11 refills Physical in 1 year Mammogram yearly  Get flu shot Labs with PCP Colonoscopy per Dr Gala Romney

## 2014-04-11 ENCOUNTER — Encounter: Payer: Self-pay | Admitting: Adult Health

## 2014-04-28 ENCOUNTER — Ambulatory Visit (INDEPENDENT_AMBULATORY_CARE_PROVIDER_SITE_OTHER): Payer: BC Managed Care – PPO | Admitting: Podiatry

## 2014-04-28 ENCOUNTER — Ambulatory Visit (INDEPENDENT_AMBULATORY_CARE_PROVIDER_SITE_OTHER): Payer: BC Managed Care – PPO

## 2014-04-28 ENCOUNTER — Encounter: Payer: Self-pay | Admitting: Podiatry

## 2014-04-28 VITALS — BP 131/73 | HR 74 | Resp 16 | Ht 61.0 in | Wt 186.0 lb

## 2014-04-28 DIAGNOSIS — G629 Polyneuropathy, unspecified: Secondary | ICD-10-CM

## 2014-04-28 DIAGNOSIS — S92911A Unspecified fracture of right toe(s), initial encounter for closed fracture: Secondary | ICD-10-CM

## 2014-04-28 DIAGNOSIS — Q6650 Congenital pes planus, unspecified foot: Secondary | ICD-10-CM

## 2014-04-28 NOTE — Progress Notes (Signed)
   Subjective:    Patient ID: Shelby Guerra, female    DOB: 1956/04/24, 58 y.o.   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 Right second toe just hurts   Foot Pain      Review of Systems  All other systems reviewed and are negative.      Objective:   Physical Exam: I have reviewed her past medical history medications allergies surgery social history and review of systems reviewed pulses are strongly palpable bilateral. Neurologic sensorium is intact per Semmes-Weinstein monofilament. Deep tendon reflexes are intact bilaterally muscle strength is 5 over 5 dorsiflexion and plantar flexors inverters and everters all intrinsic musculature is intact. Orthopedic evaluation of his resolved joints distal to the ankle for range of motion with crepitation. She has pain on palpation of the DIPJ with some swelling overlying the distal phalanx second digit right foot. Radiographic evaluation demonstrates what appears to be a fracture of the distal phalanx not comminuted nondisplaced. Soft tissue increase in density at the plantar fascial calcaneal insertion site. Hallux abductovalgus deformity is also present. As his pes planus. Cutaneous evaluation and straight supple well-hydrated daily is no erythema edema saline stretcher.        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 Fractured second distal phalanx right. Plantar fasciitis.  Plan: Discussed etiology pathology conservative versus surgical therapies. She was scanned for pair of orthotics and I also demonstrated to her how to tape her second toe is bleeding. I will follow up with her once her orthotics come in. She will notify us more quickly if she has pain to the toe.

## 2014-05-03 ENCOUNTER — Ambulatory Visit (HOSPITAL_COMMUNITY)
Admission: RE | Admit: 2014-05-03 | Discharge: 2014-05-03 | Disposition: A | Discharge status: Home / Self Care | Payer: BC Managed Care – PPO | Source: Ambulatory Visit | Attending: Family Medicine | Admitting: Family Medicine

## 2014-05-03 ENCOUNTER — Encounter: Payer: Self-pay | Admitting: Family Medicine

## 2014-05-03 ENCOUNTER — Ambulatory Visit (INDEPENDENT_AMBULATORY_CARE_PROVIDER_SITE_OTHER): Payer: BC Managed Care – PPO | Admitting: Family Medicine

## 2014-05-03 VITALS — BP 130/88 | Temp 98.0°F | Ht 61.5 in | Wt 185.5 lb

## 2014-05-03 DIAGNOSIS — M25561 Pain in right knee: Secondary | ICD-10-CM

## 2014-05-03 MED ORDER — NABUMETONE 750 MG PO TABS: 750.0000 mg | ORAL_TABLET | Freq: Two times a day (BID) | ORAL | Status: DC | PRN

## 2014-05-03 NOTE — Progress Notes (Signed)
   Subjective:    Patient ID: Shelby Guerra, female    DOB: 04-24-1956, 58 y.o.   MRN: [id number]  Knee Pain  The incident occurred more than 1 week ago. The incident occurred at home. There was no injury mechanism. The pain is present in the left knee and right knee. The quality of the pain is described as aching. The pain is moderate. The pain has been intermittent since onset. She reports no foreign bodies present. The symptoms are aggravated by movement. She has tried NSAIDs for the symptoms. The treatment provided no relief.   Patient states that she has no other concerns at this time.   Pt stands mostly at work  Hx of knee pain in both sides  fam hx of gout  No recent injury  Pain got partic worse the last couple wks  advil and ibu prn takes four tabs, uses reg every othr day  Right knee is more painful than left knee. Stands a lot with her job. No headache no chest pain no back pain no change in bowel habits ROS otherwise negative Review of Systems    see above Objective:   Physical Exam Alert no apparent distress. Vital stable lungs clear. Heart regular in rhythm. Right knee mild edema diffuse pain no focal tenderness no joint laxity       Assessment & Plan:  Impression flare knee pain potential early arthritis discuss plan trial Relafen. X-ray affected knee. Symptomatic care discussed. WSL

## 2014-05-04 NOTE — Progress Notes (Signed)
Patient notified and verbalized understanding of the test results. No further questions. 

## 2014-07-26 ENCOUNTER — Encounter: Payer: Self-pay | Admitting: Family Medicine

## 2014-07-26 ENCOUNTER — Ambulatory Visit (INDEPENDENT_AMBULATORY_CARE_PROVIDER_SITE_OTHER): Payer: BLUE CROSS/BLUE SHIELD | Admitting: Family Medicine

## 2014-07-26 VITALS — BP 118/82 | Ht 61.5 in | Wt 181.0 lb

## 2014-07-26 DIAGNOSIS — M25562 Pain in left knee: Secondary | ICD-10-CM

## 2014-07-26 DIAGNOSIS — M546 Pain in thoracic spine: Secondary | ICD-10-CM

## 2014-07-26 MED ORDER — HYDROCODONE-ACETAMINOPHEN 5-325 MG PO TABS: 1.0000 | ORAL_TABLET | Freq: Four times a day (QID) | ORAL | Status: DC | PRN

## 2014-07-26 MED ORDER — IBUPROFEN 800 MG PO TABS: 800.0000 mg | ORAL_TABLET | Freq: Three times a day (TID) | ORAL | Status: DC | PRN

## 2014-07-26 NOTE — Progress Notes (Signed)
   Subjective:    Patient ID: Shelby Guerra, female    DOB: [birth year], 59 y.o.   MRN: [id number]  Fall Incident onset: Last night. The fall occurred while walking (Fell down her steps onto the sidewalk). She fell from a height of 3 to 5 ft. She landed on concrete. There was no blood loss. The point of impact was the left knee (lower back). The pain is present in the right knee (lower back ). The symptoms are aggravated by pressure on injury. Associated symptoms include abdominal pain and nausea. She has tried elevation for the symptoms. The treatment provided mild relief.   Slipped on ice, could not go to work  Was tossing and turning all night  Was looking for ibu but had none Patient was limping walking into the office. Severe mid back pain. Difficulty with motion. States definitely unable to do current job  Review of Systems  Gastrointestinal: Positive for nausea and abdominal pain.   No chest pain no headache no change in bowel habits ROS otherwise negative    Objective:   Physical Exam Alert mild malaise lungs clear heart regular rhythm low thorax tender to percussion no deformity negative straight leg raise left anterior knee very tender slightly swollen       Assessment & Plan:  Impression hopefully multiple severe contusions and no fractures discussed plan ibuprofen regularly. Add hydrocodone when necessary. X-ray appropriate areas. Work excuse this week due to physical nature of job WSL

## 2014-07-27 ENCOUNTER — Other Ambulatory Visit: Payer: Self-pay | Admitting: Family Medicine

## 2014-07-27 ENCOUNTER — Ambulatory Visit (HOSPITAL_COMMUNITY)
Admission: RE | Admit: 2014-07-27 | Discharge: 2014-07-27 | Disposition: A | Discharge status: Home / Self Care | Payer: BLUE CROSS/BLUE SHIELD | Source: Ambulatory Visit | Attending: Family Medicine | Admitting: Family Medicine

## 2014-07-27 DIAGNOSIS — S8982XA Other specified injuries of left lower leg, initial encounter: Secondary | ICD-10-CM | POA: Diagnosis not present

## 2014-07-27 DIAGNOSIS — M546 Pain in thoracic spine: Secondary | ICD-10-CM

## 2014-07-27 DIAGNOSIS — M25562 Pain in left knee: Secondary | ICD-10-CM | POA: Insufficient documentation

## 2014-07-27 DIAGNOSIS — W000XXA Fall on same level due to ice and snow, initial encounter: Secondary | ICD-10-CM | POA: Diagnosis not present

## 2014-08-01 ENCOUNTER — Telehealth: Payer: Self-pay | Admitting: Family Medicine

## 2014-08-01 NOTE — Telephone Encounter (Signed)
Patient still having pain in her knee and needs her work excuse extended.

## 2014-08-01 NOTE — Telephone Encounter (Signed)
Pt called stating that she is still in pain from the fall she took last week. Pt is requesting more time off from work. Pt would also like to speak with A nurse.

## 2014-08-01 NOTE — Telephone Encounter (Signed)
Ok tod thru wed

## 2014-08-04 DIAGNOSIS — Z029 Encounter for administrative examinations, unspecified: Secondary | ICD-10-CM

## 2014-08-26 ENCOUNTER — Other Ambulatory Visit: Payer: Self-pay | Admitting: Gastroenterology

## 2014-08-31 ENCOUNTER — Encounter: Payer: Self-pay | Admitting: *Deleted

## 2014-09-14 ENCOUNTER — Telehealth: Payer: Self-pay | Admitting: Family Medicine

## 2014-09-14 NOTE — Telephone Encounter (Signed)
Ok ref times one, if further needed willl need f u visit

## 2014-09-14 NOTE — Telephone Encounter (Signed)
Pt was seen in Feb for a back injury due to fall  It has started irritating her again an has missed a few Days of work from it. Can we call her in something or some  More of the Hydrocodone and Ibuprofen you issued last time    [facility]

## 2014-09-14 NOTE — Telephone Encounter (Signed)
Pt last seen 2/16

## 2014-09-15 ENCOUNTER — Telehealth: Payer: Self-pay | Admitting: Family Medicine

## 2014-09-15 MED ORDER — HYDROCODONE-ACETAMINOPHEN 5-325 MG PO TABS: 1.0000 | ORAL_TABLET | Freq: Four times a day (QID) | ORAL | Status: DC | PRN

## 2014-09-15 MED ORDER — IBUPROFEN 800 MG PO TABS: 800.0000 mg | ORAL_TABLET | Freq: Three times a day (TID) | ORAL | Status: DC | PRN

## 2014-09-15 NOTE — Telephone Encounter (Signed)
Pt notified on voicemail that script ready for pickup.

## 2014-09-15 NOTE — Telephone Encounter (Signed)
Patient needs addendum to FMLA  that was done in Feb where she fell and hurt her knees and back. She was out of work 2 days with back pain and her job wants to know how many episodes per every 2 to 3 mths and how many days per episodes she will be out of work. She needs this by Monday please .

## 2014-09-16 NOTE — Telephone Encounter (Signed)
Have erica re do the documentation and i will sign this weekend

## 2014-09-26 ENCOUNTER — Encounter: Payer: Self-pay | Admitting: Family Medicine

## 2014-09-26 ENCOUNTER — Ambulatory Visit (INDEPENDENT_AMBULATORY_CARE_PROVIDER_SITE_OTHER): Payer: BLUE CROSS/BLUE SHIELD | Admitting: Family Medicine

## 2014-09-26 VITALS — BP 128/90 | Temp 97.8°F | Ht 61.5 in | Wt 182.0 lb

## 2014-09-26 DIAGNOSIS — R350 Frequency of micturition: Secondary | ICD-10-CM

## 2014-09-26 DIAGNOSIS — M25561 Pain in right knee: Secondary | ICD-10-CM

## 2014-09-26 LAB — POCT URINALYSIS DIPSTICK
Spec Grav, UA: 1.025
pH, UA: 5

## 2014-09-26 MED ORDER — ETODOLAC 400 MG PO TABS: 400.0000 mg | ORAL_TABLET | Freq: Two times a day (BID) | ORAL | Status: DC | PRN

## 2014-09-26 MED ORDER — HYDROCODONE-ACETAMINOPHEN 5-325 MG PO TABS
1.0000 | ORAL_TABLET | Freq: Four times a day (QID) | ORAL | Status: DC | PRN
Start: 2014-09-26 — End: 2015-02-01

## 2014-09-26 NOTE — Progress Notes (Signed)
   Subjective:    Patient ID: Shelby Guerra, female    DOB: 1956/03/30, 59 y.o.   MRN: [id number]  Knee Pain  The incident occurred 3 to 5 days ago. The incident occurred at home. The injury mechanism was a fall. The pain is present in the right knee. The quality of the pain is described as aching. The pain is moderate. The pain has been intermittent since onset. She reports no foreign bodies present. The symptoms are aggravated by movement. She has tried NSAIDs for the symptoms. The treatment provided mild relief.  Urinary Tract Infection  This is a new problem. The current episode started today. The problem occurs every urination. The problem has been unchanged. The pain is at a severity of 0/10. The patient is experiencing no pain. There has been no fever. Associated symptoms include frequency. She has tried nothing for the symptoms. The treatment provided no relief.   Got up sat and it was hurting some  Took aleave and layed around a fair amnt, recalls no distinct injury   Some swelling. 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 Review of Systems  Genitourinary: Positive for frequency.   no headache no chest pain     Objective:   Physical Exam  Alert mild discomfort right knee somewhat swollen positive crepitations no joint laxity diffuse achiness to palpation not wanting to flex or extend significant limp while walking no CVA tenderness lungs clear neck  Urinalysis 4-6 white blood cells per high-power field      Assessment & Plan:  Impression 1 flare of right knee arthritis #2 urinary tract infection plan Cipro twice a day 5 days. Symptom care discussed. You anti-inflammatory medicine for knee x-ray of involving the work excuse this week WSL

## 2014-09-27 ENCOUNTER — Telehealth: Payer: Self-pay | Admitting: Family Medicine

## 2014-09-27 ENCOUNTER — Ambulatory Visit (HOSPITAL_COMMUNITY)
Admission: RE | Admit: 2014-09-27 | Discharge: 2014-09-27 | Disposition: A | Discharge status: Home / Self Care | Payer: BLUE CROSS/BLUE SHIELD | Source: Ambulatory Visit | Attending: Family Medicine | Admitting: Family Medicine

## 2014-09-27 DIAGNOSIS — M25461 Effusion, right knee: Secondary | ICD-10-CM | POA: Insufficient documentation

## 2014-09-27 DIAGNOSIS — M25561 Pain in right knee: Secondary | ICD-10-CM | POA: Diagnosis present

## 2014-09-27 MED ORDER — CIPROFLOXACIN HCL 250 MG PO TABS: 250.0000 mg | ORAL_TABLET | Freq: Two times a day (BID) | ORAL | Status: DC

## 2014-09-27 NOTE — Telephone Encounter (Signed)
Rx sent electronically to pharmacy. Patient notified. 

## 2014-09-27 NOTE — Telephone Encounter (Signed)
Patient was seen yesterday and says that she was supposed to have something for bladder infection called in but pharmacy did not have anything. Please advise.    [place]

## 2014-09-27 NOTE — Telephone Encounter (Signed)
cipro 250 bid for five d

## 2014-09-30 ENCOUNTER — Telehealth: Payer: Self-pay | Admitting: Family Medicine

## 2014-09-30 MED ORDER — FLUCONAZOLE 150 MG PO TABS: 150.0000 mg | ORAL_TABLET | Freq: Every day | ORAL | Status: DC

## 2014-09-30 NOTE — Telephone Encounter (Signed)
Patient requested a nurse to call her back regarding a recent visit where she had a bladder infection and was prescribed antibiotics. She is concerned that she may get a yeast infection after this and wants to know if we want to go ahead and call in something for a yeast infection?

## 2014-09-30 NOTE — Telephone Encounter (Signed)
Diflucan sent to pharmacy per protocol. Patient was notified.

## 2014-10-31 ENCOUNTER — Other Ambulatory Visit: Payer: Self-pay | Admitting: Adult Health

## 2014-10-31 DIAGNOSIS — Z1231 Encounter for screening mammogram for malignant neoplasm of breast: Secondary | ICD-10-CM

## 2014-11-14 ENCOUNTER — Encounter: Payer: Self-pay | Admitting: Family Medicine

## 2014-11-14 ENCOUNTER — Ambulatory Visit (INDEPENDENT_AMBULATORY_CARE_PROVIDER_SITE_OTHER): Payer: BLUE CROSS/BLUE SHIELD | Admitting: Family Medicine

## 2014-11-14 VITALS — BP 138/86 | Ht 61.5 in | Wt 176.2 lb

## 2014-11-14 DIAGNOSIS — M25561 Pain in right knee: Secondary | ICD-10-CM | POA: Diagnosis not present

## 2014-11-14 MED ORDER — NABUMETONE 750 MG PO TABS: 750.0000 mg | ORAL_TABLET | Freq: Two times a day (BID) | ORAL | Status: DC | PRN

## 2014-11-14 NOTE — Progress Notes (Signed)
   Subjective:    Patient ID: Shelby Guerra, female    DOB: Jan 01, 1956, 59 y.o.   MRN: [id number]  HPI  Patient arrives for a recheck on right knee pain.  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   relafen or ibuprofem 800 prn has helped in the past  Review of Systems No hip pain no ankle pain no sudden injury no rash    Objective:   Physical Exam  Alert vital stable HEENT normal. Lungs clear heart regular in rhythm. Right knee positive crepitation with extension no obvious effusion no joint line tenderness      Assessment & Plan:  Impression flare of chronic right knee pain with excessive work and strain plan Relafen prescribed. Local measures discussed WSL

## 2014-11-28 ENCOUNTER — Ambulatory Visit (HOSPITAL_COMMUNITY)
Admission: RE | Admit: 2014-11-28 | Discharge: 2014-11-28 | Disposition: A | Discharge status: Home / Self Care | Payer: BLUE CROSS/BLUE SHIELD | Source: Ambulatory Visit | Attending: Adult Health | Admitting: Adult Health

## 2014-11-28 DIAGNOSIS — Z1231 Encounter for screening mammogram for malignant neoplasm of breast: Secondary | ICD-10-CM | POA: Diagnosis not present

## 2015-01-10 ENCOUNTER — Telehealth: Payer: Self-pay | Admitting: Family Medicine

## 2015-01-10 DIAGNOSIS — Z13220 Encounter for screening for lipoid disorders: Secondary | ICD-10-CM

## 2015-01-10 DIAGNOSIS — Z79899 Other long term (current) drug therapy: Secondary | ICD-10-CM

## 2015-01-10 NOTE — Telephone Encounter (Signed)
Pt is requesting her lab orders to be sent over. Last labs per epic were: lipid,and bmp on 12/25/13

## 2015-01-10 NOTE — Telephone Encounter (Signed)
Lip liv m7 

## 2015-01-11 NOTE — Telephone Encounter (Signed)
Notified patient that lab work has been ordered.

## 2015-01-15 ENCOUNTER — Encounter: Payer: Self-pay | Admitting: Family Medicine

## 2015-01-15 LAB — HEPATIC FUNCTION PANEL
ALT: 12 IU/L (ref 0–32)
AST: 15 IU/L (ref 0–40)
Albumin: 3.9 g/dL (ref 3.5–5.5)
Alkaline Phosphatase: 81 IU/L (ref 39–117)
BILIRUBIN TOTAL: 0.3 mg/dL (ref 0.0–1.2)
Bilirubin, Direct: 0.1 mg/dL (ref 0.00–0.40)
Total Protein: 6.6 g/dL (ref 6.0–8.5)

## 2015-01-15 LAB — BASIC METABOLIC PANEL
BUN / CREAT RATIO: 14 (ref 9–23)
BUN: 11 mg/dL (ref 6–24)
CALCIUM: 9.3 mg/dL (ref 8.7–10.2)
CHLORIDE: 103 mmol/L (ref 97–108)
CO2: 24 mmol/L (ref 18–29)
Creatinine, Ser: 0.79 mg/dL (ref 0.57–1.00)
GFR calc Af Amer: 96 mL/min/{1.73_m2} (ref >59)
GFR calc non Af Amer: 83 mL/min/{1.73_m2} (ref >59)
Glucose: 88 mg/dL (ref 65–99)
Potassium: 4.3 mmol/L (ref 3.5–5.2)
Sodium: 142 mmol/L (ref 134–144)

## 2015-01-15 LAB — LIPID PANEL
CHOL/HDL RATIO: 3.8 ratio (ref 0.0–4.4)
Cholesterol, Total: 201 mg/dL — ABNORMAL HIGH (ref 100–199)
HDL: 53 mg/dL (ref >39)
LDL Calculated: 136 mg/dL — ABNORMAL HIGH (ref 0–99)
Triglycerides: 59 mg/dL (ref 0–149)
VLDL Cholesterol Cal: 12 mg/dL (ref 5–40)

## 2015-01-29 ENCOUNTER — Encounter (HOSPITAL_COMMUNITY): Payer: Self-pay | Admitting: *Deleted

## 2015-01-29 ENCOUNTER — Emergency Department (HOSPITAL_COMMUNITY)
Admission: EM | Admit: 2015-01-29 | Discharge: 2015-01-30 | Disposition: A | Discharge status: Home / Self Care | Payer: BLUE CROSS/BLUE SHIELD | Attending: Emergency Medicine | Admitting: Emergency Medicine

## 2015-01-29 DIAGNOSIS — I10 Essential (primary) hypertension: Secondary | ICD-10-CM | POA: Insufficient documentation

## 2015-01-29 DIAGNOSIS — Z8742 Personal history of other diseases of the female genital tract: Secondary | ICD-10-CM | POA: Insufficient documentation

## 2015-01-29 DIAGNOSIS — Z79899 Other long term (current) drug therapy: Secondary | ICD-10-CM | POA: Diagnosis not present

## 2015-01-29 DIAGNOSIS — Z87442 Personal history of urinary calculi: Secondary | ICD-10-CM | POA: Diagnosis not present

## 2015-01-29 DIAGNOSIS — K219 Gastro-esophageal reflux disease without esophagitis: Secondary | ICD-10-CM | POA: Insufficient documentation

## 2015-01-29 DIAGNOSIS — Z8639 Personal history of other endocrine, nutritional and metabolic disease: Secondary | ICD-10-CM | POA: Insufficient documentation

## 2015-01-29 DIAGNOSIS — K5900 Constipation, unspecified: Secondary | ICD-10-CM | POA: Insufficient documentation

## 2015-01-29 DIAGNOSIS — R112 Nausea with vomiting, unspecified: Secondary | ICD-10-CM | POA: Diagnosis present

## 2015-01-29 MED ORDER — ONDANSETRON HCL 4 MG/2ML IJ SOLN
4.0000 mg | Freq: Once | INTRAMUSCULAR | Status: AC
Start: 2015-01-30 — End: 2015-01-30
  Administered 2015-01-30: 4 mg via INTRAVENOUS
  Filled 2015-01-29: qty 2

## 2015-01-29 NOTE — ED Notes (Signed)
Pt c/o lower abdominal pain and n/v x 1 day

## 2015-01-30 ENCOUNTER — Emergency Department (HOSPITAL_COMMUNITY): Payer: BLUE CROSS/BLUE SHIELD

## 2015-01-30 LAB — CBC WITH DIFFERENTIAL/PLATELET
Basophils Absolute: 0 10*3/uL (ref 0.0–0.1)
Basophils Relative: 0 % (ref 0–1)
Eosinophils Absolute: 0.1 10*3/uL (ref 0.0–0.7)
Eosinophils Relative: 2 % (ref 0–5)
HEMATOCRIT: 39.8 % (ref 36.0–46.0)
HEMOGLOBIN: 13 g/dL (ref 12.0–15.0)
LYMPHS ABS: 2.2 10*3/uL (ref 0.7–4.0)
Lymphocytes Relative: 32 % (ref 12–46)
MCH: 28.5 pg (ref 26.0–34.0)
MCHC: 32.7 g/dL (ref 30.0–36.0)
MCV: 87.3 fL (ref 78.0–100.0)
MONO ABS: 0.7 10*3/uL (ref 0.1–1.0)
MONOS PCT: 10 % (ref 3–12)
NEUTROS ABS: 3.8 10*3/uL (ref 1.7–7.7)
NEUTROS PCT: 56 % (ref 43–77)
Platelets: 229 10*3/uL (ref 150–400)
RBC: 4.56 MIL/uL (ref 3.87–5.11)
RDW: 13.6 % (ref 11.5–15.5)
WBC: 6.8 10*3/uL (ref 4.0–10.5)

## 2015-01-30 LAB — COMPREHENSIVE METABOLIC PANEL
ALK PHOS: 71 U/L (ref 38–126)
ALT: 13 U/L — ABNORMAL LOW (ref 14–54)
ANION GAP: 6 (ref 5–15)
AST: 16 U/L (ref 15–41)
Albumin: 3.7 g/dL (ref 3.5–5.0)
BILIRUBIN TOTAL: 0.5 mg/dL (ref 0.3–1.2)
BUN: 21 mg/dL — ABNORMAL HIGH (ref 6–20)
CALCIUM: 8.7 mg/dL — AB (ref 8.9–10.3)
CO2: 24 mmol/L (ref 22–32)
Chloride: 108 mmol/L (ref 101–111)
Creatinine, Ser: 0.82 mg/dL (ref 0.44–1.00)
Glucose, Bld: 98 mg/dL (ref 65–99)
POTASSIUM: 3.9 mmol/L (ref 3.5–5.1)
Sodium: 138 mmol/L (ref 135–145)
TOTAL PROTEIN: 7 g/dL (ref 6.5–8.1)

## 2015-01-30 LAB — URINE MICROSCOPIC-ADD ON

## 2015-01-30 LAB — URINALYSIS, ROUTINE W REFLEX MICROSCOPIC
BILIRUBIN URINE: NEGATIVE
Glucose, UA: NEGATIVE mg/dL
KETONES UR: NEGATIVE mg/dL
Leukocytes, UA: NEGATIVE
Nitrite: NEGATIVE
PH: 5.5 (ref 5.0–8.0)
Protein, ur: NEGATIVE mg/dL
Specific Gravity, Urine: >1.03 — ABNORMAL HIGH (ref 1.005–1.030)
Urobilinogen, UA: 0.2 mg/dL (ref 0.0–1.0)

## 2015-01-30 MED ORDER — POLYETHYLENE GLYCOL 3350 17 G PO PACK: 17.0000 g | PACK | Freq: Every day | ORAL | Status: DC

## 2015-01-30 NOTE — ED Provider Notes (Signed)
CSN: [id number]     Arrival date & time 01/29/15  2336 History   First MD Initiated Contact with Patient 01/29/15 2357     Chief Complaint  Patient presents with  . Emesis     (Consider location/radiation/quality/duration/timing/severity/associated sxs/prior Treatment) The history is provided by the patient.   Shelby Guerra is a 59 y.o. female with a past medical history of GERD, htn and h/o kidney stones, surgical hx of abdominal hysterectomy and right inguinal hernia repair presenting with a 1 day history of nausea with clear, non bloody emesis (emesis yesterday only) and midline abdominal pain but more intense in her lower abdomen, described as cramping and intermittently sharp and is worsened with walking and certain positions. She endorses feeling bloated, has been passing a normal amount of flatus. She denies documented fevers, but has felt hot flushes.  She works in a [employer], industrial/product and had to leave early yesterday with her symptoms but also though she might have a heat related illness.  Her last bm was yesterday and normal, denies constipation although does have a history of this.  She has had decreased appetite since her symptoms began, last ate 6 hours before arrival, but ate light, eating did not worsen her symptoms.  Denies dysuria, denies cp, no sob.  She has found no alleviators.     Past Medical History  Diagnosis Date  . High cholesterol   . Hot flashes 03/10/2013  . GERD (gastroesophageal reflux disease)   . Moody 03/14/2014  . Hypertension   . Impaired fasting glucose   . Kidney stone     oxalate/uric acid   Past Surgical History  Procedure Laterality Date  . Colonoscopy    12/13/2008    Dr. Gala Romney: Normal rectum and colon  . Abdominal hysterectomy    . Inguinal hernia repair    . Tubal ligation     Family History  Problem Relation Age of Onset  . Hypertension Brother   . Kidney failure Brother   . Hyperlipidemia Brother   . Hypertension  Sister   . Hyperlipidemia Sister   . Cancer Sister     cervical  . Hypertension Brother   . Colon cancer Neg Hx   . Kidney failure Father   . Cancer Mother    Social History  Substance Use Topics  . Smoking status: Never Smoker   . Smokeless tobacco: Never Used  . Alcohol Use: No   OB History    Gravida Para Term Preterm AB TAB SAB Ectopic Multiple Living   1 1        1      Review of Systems  Constitutional: Positive for fever.  HENT: Negative for congestion and sore throat.   Eyes: Negative.   Respiratory: Negative for chest tightness and shortness of breath.   Cardiovascular: Negative for chest pain.  Gastrointestinal: Positive for nausea, vomiting, abdominal pain and abdominal distention. Negative for diarrhea and constipation.  Genitourinary: Negative.   Musculoskeletal: Negative for joint swelling, arthralgias and neck pain.  Skin: Negative.  Negative for rash and wound.  Neurological: Negative for dizziness, weakness, light-headedness, numbness and headaches.  Psychiatric/Behavioral: Negative.       Allergies  Codeine and Propoxyphene n-acetaminophen  Home Medications   Prior to Admission medications   Medication Sig Start Date End Date Taking? Authorizing Provider  nabumetone (RELAFEN) 750 MG tablet Take 1 tablet (750 mg total) by mouth 2 (two) times daily as needed. 11/14/14  Yes Mikey Kirschner, MD  pantoprazole (PROTONIX) 40 MG tablet TAKE ONE (1) TABLET EACH DAY TAKE 30 MINUTES BEFORE BREAKFAST 08/30/14  Yes Orvil Feil, NP  venlafaxine XR (EFFEXOR-XR) 150 MG 24 hr capsule Take 1 capsule (150 mg total) by mouth daily with breakfast. 03/14/14  Yes Estill Dooms, NP  HYDROcodone-acetaminophen (NORCO/VICODIN) 5-325 MG per tablet Take 1 tablet by mouth every 6 (six) hours as needed for moderate pain or severe pain. 09/26/14   Mikey Kirschner, MD  polyethylene glycol [facility] / Floria Raveling) packet Take 17 g by mouth daily. 01/30/15   Evalee Jefferson, PA-C   BP 146/86  mmHg  Pulse 55  Temp(Src) 97.6 F (36.4 C) (Oral)  Resp 20  Ht 5\' 2"  (1.575 m)  Wt 180 lb (81.647 kg)  BMI 32.91 kg/m2  SpO2 98% Physical Exam  Constitutional: She appears well-developed and well-nourished.  HENT:  Head: Normocephalic and atraumatic.  Eyes: Conjunctivae are normal.  Neck: Normal range of motion.  Cardiovascular: Normal rate, regular rhythm, normal heart sounds and intact distal pulses.   Pulmonary/Chest: Effort normal and breath sounds normal. She has no wheezes.  Abdominal: Soft. She exhibits distension. Bowel sounds are increased. There is no hepatosplenomegaly. There is tenderness in the epigastric area, periumbilical area and suprapubic area. There is no rigidity, no rebound, no guarding, no CVA tenderness and no tenderness at McBurney's point.  Musculoskeletal: Normal range of motion.  Neurological: She is alert.  Skin: Skin is warm and dry.  Psychiatric: She has a normal mood and affect.  Nursing note and vitals reviewed.   ED Course  Procedures (including critical care time) Labs Review Labs Reviewed  URINALYSIS, ROUTINE W REFLEX MICROSCOPIC (NOT AT [facility]) - Abnormal; Notable for the following:    Specific Gravity, Urine >1.030 (*)    Hgb urine dipstick TRACE (*)    All other components within normal limits  COMPREHENSIVE METABOLIC PANEL - Abnormal; Notable for the following:    BUN 21 (*)    Calcium 8.7 (*)    ALT 13 (*)    All other components within normal limits  URINE MICROSCOPIC-ADD ON - Abnormal; Notable for the following:    Squamous Epithelial / LPF MANY (*)    Bacteria, UA FEW (*)    All other components within normal limits  CBC WITH DIFFERENTIAL/PLATELET    Imaging Review Dg Abd Acute W/chest  01/30/2015   CLINICAL DATA:  Lower abdominal pain, nausea and vomiting for 1 day.  EXAM: DG ABDOMEN ACUTE W/ 1V CHEST  COMPARISON:  CT abdomen and pelvis 02/09/2010  FINDINGS: Shallow inspiration. Normal heart size and pulmonary vascularity. No  focal airspace disease or consolidation in the lungs. No blunting of costophrenic angles. No pneumothorax. Mediastinal contours appear intact.  Gas and stool throughout the colon. No small or large bowel distention. No free intra-abdominal air. No abnormal air-fluid levels. No radiopaque stones. Degenerative changes in the spine and hips.  IMPRESSION: No evidence of active pulmonary disease. Normal nonobstructive bowel gas pattern.   Electronically Signed   By: Lucienne Capers M.D.   On: 01/30/2015 02:04   I have personally reviewed and evaluated these images and lab results as part of my medical decision-making.   EKG Interpretation None      MDM   Final diagnoses:  Constipation, unspecified constipation type    Patients labs reviewed.  Radiological studies were viewed, interpreted and considered during the medical decision making and disposition process. I agree with radiologists reading.  Results were also discussed  with patient.   Pt still without acute abdominal findings on re exam prior to dc.  Pt reports feels better since first arrival, pain almost resolved.  Discussed labs, there is evidence of mild dehydration, advised to increase fluid intake.  Moderate stool on imaging.  Miralax prescribed which she has taken in the past for constipation.  Advised recheck by pcp if sx persist, or recheck here for worsened pain, fevers or new sx.     Evalee Jefferson, PA-C 01/30/15 0761  Julianne Rice, MD 01/30/15 [phone]

## 2015-01-30 NOTE — Discharge Instructions (Signed)

## 2015-01-30 NOTE — ED Notes (Signed)
Discharge instructions given, pt demonstrated teach back and verbal understanding. No concerns voiced.  

## 2015-01-31 ENCOUNTER — Encounter: Payer: Self-pay | Admitting: Nurse Practitioner

## 2015-01-31 ENCOUNTER — Ambulatory Visit (INDEPENDENT_AMBULATORY_CARE_PROVIDER_SITE_OTHER): Payer: BLUE CROSS/BLUE SHIELD | Admitting: Nurse Practitioner

## 2015-01-31 VITALS — BP 122/79 | HR 59 | Temp 97.9°F | Ht 63.0 in | Wt 171.2 lb

## 2015-01-31 DIAGNOSIS — K219 Gastro-esophageal reflux disease without esophagitis: Secondary | ICD-10-CM | POA: Diagnosis not present

## 2015-01-31 DIAGNOSIS — K5909 Other constipation: Secondary | ICD-10-CM

## 2015-01-31 NOTE — Patient Instructions (Signed)
1. Take your Protonix daily. Call if you need a refill 2. Take your Linzess daily (rather than occasionally as needed). 3. Return for follow-up in 3 months.

## 2015-01-31 NOTE — Assessment & Plan Note (Signed)
Patient with a history of chronic GERD. She is currently having nausea and epigastric pain symptoms which are consistent for her GERD presentation. She has been out of her PPI for several months and just received a refill, days ago at which point she started taking it again. Her symptoms are likely exacerbation of GERD due to discontinuing PPI. Recommended that she take her Protonix daily as prescribed, call for any needed refills. We'll have her follow-up in 3 months for further evaluation.

## 2015-01-31 NOTE — Assessment & Plan Note (Signed)
Patient with a history of chronic constipation she was prescribed Linzess to take daily. However she does not take it daily, only takes it occasionally. Along with her nausea symptoms she was feeling constipated, currently has a bowel movement every 2-3 days. She took a single Linzess dose which did not work. I educated her on the mechanism of Linzess and how it is required to take every day to prevent constipation. Advised her to take her Linzess daily to see if that helps her bowel movements. We'll have her follow-up in 3 months for further evaluation.

## 2015-01-31 NOTE — Progress Notes (Signed)
Referring Provider: Mikey Kirschner, MD Primary Care Physician:  Mickie Hillier, MD Primary GI:  Dr. Gala Romney  Chief Complaint  Patient presents with  . Gastrophageal Reflux  . Nausea  . Emesis    HPI:   59 year old female presents for follow-up on GERD and nausea. Screening colonoscopy up-to-date. His currently prescribed Protonix 40 mg daily. Per the nursing staff she just had a refill called in and has been out of it for an unknown amount time.  Today she states she ran out of Protonix for several months. On Saturday (about 3 days ago) her GERD symptoms flared including nausea, abdominal pain. States these symptoms are similar to her GERD symptoms. Denies esophageal burning and bitter taste, but she traditionally does not have these with her GERD presentation. Denies hematochezia and melena. Has constipation and was Rx Linzess which she does not take regularly. She was feeling bloated and constipated, took a Linzess single dose which did not help. Did vomit this morning, no blood noted. Called her pharmacy and received a refill of her Protonix 2 days ago (1 day afgter symptoms onset). Denies chest pain, dyspnea, dizziness, lightheadedness, syncope, near syncope. Denies any other upper or lower GI symptoms.  Past Medical History  Diagnosis Date  . High cholesterol   . Hot flashes 03/10/2013  . GERD (gastroesophageal reflux disease)   . Moody 03/14/2014  . Hypertension   . Impaired fasting glucose   . Kidney stone     oxalate/uric acid    Past Surgical History  Procedure Laterality Date  . Colonoscopy    12/13/2008    Dr. Gala Romney: Normal rectum and colon  . Abdominal hysterectomy    . Inguinal hernia repair    . Tubal ligation      Current Outpatient Prescriptions  Medication Sig Dispense Refill  . pantoprazole (PROTONIX) 40 MG tablet TAKE ONE (1) TABLET EACH DAY TAKE 30 MINUTES BEFORE BREAKFAST 30 tablet 5  . venlafaxine XR (EFFEXOR-XR) 150 MG 24 hr capsule Take 1 capsule  (150 mg total) by mouth daily with breakfast. 30 capsule 11  . HYDROcodone-acetaminophen (NORCO/VICODIN) 5-325 MG per tablet Take 1 tablet by mouth every 6 (six) hours as needed for moderate pain or severe pain. (Patient not taking: Reported on 01/31/2015) 24 tablet 0  . nabumetone (RELAFEN) 750 MG tablet Take 1 tablet (750 mg total) by mouth 2 (two) times daily as needed. (Patient not taking: Reported on 01/31/2015) 28 tablet 2  . polyethylene glycol (MIRALAX / GLYCOLAX) packet Take 17 g by mouth daily. (Patient not taking: Reported on 01/31/2015) 14 each 0   No current facility-administered medications for this visit.    Allergies as of 01/31/2015 - Review Complete 01/29/2015  Allergen Reaction Noted  . Codeine Itching   . Propoxyphene n-acetaminophen Nausea Only     Family History  Problem Relation Age of Onset  . Hypertension Brother   . Kidney failure Brother   . Hyperlipidemia Brother   . Hypertension Sister   . Hyperlipidemia Sister   . Cancer Sister     cervical  . Hypertension Brother   . Colon cancer Neg Hx   . Kidney failure Father   . Cancer Mother     Social History   Social History  . Marital Status: Single    Spouse Name: N/A  . Number of Children: N/A  . Years of Education: N/A   Social History Main Topics  . Smoking status: Never Smoker   . Smokeless tobacco:  Never Used  . Alcohol Use: No  . Drug Use: No  . Sexual Activity: No   Other Topics Concern  . None   Social History Narrative    Review of Systems: General: Negative for anorexia, weight loss, fever, chills, fatigue, weakness. CV: Negative for chest pain, angina, palpitations, dyspnea on exertion, peripheral edema.  Respiratory: Negative for dyspnea at rest, dyspnea on exertion, cough, sputum, wheezing.  GI: See history of present illness. Derm: Negative for rash or itching.  Endo: Negative for unusual weight change.  Heme: Negative for bruising or bleeding.   Physical Exam: BP 122/79  mmHg  Pulse 59  Temp(Src) 97.9 F (36.6 C) (Oral)  Ht 5\' 3"  (1.6 m)  Wt 171 lb 3.2 oz (77.656 kg)  BMI 30.33 kg/m2 General:   Alert and oriented. Pleasant and cooperative. Well-nourished and well-developed.  Head:  Normocephalic and atraumatic. Ears:  Normal auditory acuity. Cardiovascular:  S1, S2 present without murmurs appreciated. Extremities without clubbing or edema. Respiratory:  Clear to auscultation bilaterally. No wheezes, rales, or rhonchi. No distress.  Gastrointestinal:  +BS, soft, non-tender and non-distended. No HSM noted. No guarding or rebound. No masses appreciated.  Rectal:  Deferred  Neurologic:  Alert and oriented x4;  grossly normal neurologically. Psych:  Alert and cooperative. Normal mood and affect. Heme/Lymph/Immune: No excessive bruising noted.    01/31/2015 1:41 PM

## 2015-02-01 ENCOUNTER — Ambulatory Visit (INDEPENDENT_AMBULATORY_CARE_PROVIDER_SITE_OTHER): Payer: BLUE CROSS/BLUE SHIELD | Admitting: Family Medicine

## 2015-02-01 ENCOUNTER — Encounter: Payer: Self-pay | Admitting: Family Medicine

## 2015-02-01 VITALS — BP 128/82 | Ht 61.5 in | Wt 170.6 lb

## 2015-02-01 DIAGNOSIS — K219 Gastro-esophageal reflux disease without esophagitis: Secondary | ICD-10-CM

## 2015-02-01 DIAGNOSIS — K5909 Other constipation: Secondary | ICD-10-CM

## 2015-02-01 DIAGNOSIS — F329 Major depressive disorder, single episode, unspecified: Secondary | ICD-10-CM | POA: Diagnosis not present

## 2015-02-01 DIAGNOSIS — F32A Depression, unspecified: Secondary | ICD-10-CM

## 2015-02-01 MED ORDER — ONDANSETRON 4 MG PO TBDP: 4.0000 mg | ORAL_TABLET | Freq: Three times a day (TID) | ORAL | Status: DC | PRN

## 2015-02-01 MED ORDER — PANTOPRAZOLE SODIUM 40 MG PO TBEC: DELAYED_RELEASE_TABLET | ORAL | Status: DC

## 2015-02-01 MED ORDER — VENLAFAXINE HCL ER 150 MG PO CP24: 150.0000 mg | ORAL_CAPSULE | Freq: Every day | ORAL | Status: DC

## 2015-02-01 NOTE — Progress Notes (Signed)
   Subjective:    Patient ID: Shelby Guerra, female    DOB: [birth year], 59 y.o.   MRN: [id number]  HPI  Patient arrives for a follow up on depression- currently on effexor. States overall handling the medication well. No obvious side effects. States that it has helped her mood.  Had a pretty severe bout of abdominal pain. Low abdominal. Accompanied by constipation. Also accompanied by flare of reflux symptoms.  On further history had been off the Protonix for a couple months. On further history has a history of IBS along with a tendency towards constipation. Uses was noticed for this. Has not used it recently.  Patient actually went and saw GI docs yesterday.   Patient also having nausea since eating hamburger at hospital a few days ago.   Review of Systems No headache no chest pain abdominal pain better no fever no chills no urinary symptoms    Objective:   Physical Exam Alert vitals stable HET normal lungs clear. Heart regular rhythm slight epigastric tenderness most no rebound no guarding diffuse low abdominal tenderness excellent bowel sounds       Assessment & Plan:  Impression 1 reflux recent flare with off medications #2 constipation secondary symptoms discussed. Encouraged to try marrow wax less expensive than was numbness. And may work. #3 depression clinically stable plan 25 minutes spent most in discussion of symptoms and review of ER records GI records etc. Medications refilled recheck in 6 months try marrow wax diet exercise discussed WSL

## 2015-02-01 NOTE — Progress Notes (Signed)
CC'ED TO PCP 

## 2015-02-06 ENCOUNTER — Ambulatory Visit: Payer: BLUE CROSS/BLUE SHIELD | Admitting: Family Medicine

## 2015-02-17 ENCOUNTER — Other Ambulatory Visit: Payer: Self-pay | Admitting: Family Medicine

## 2015-03-03 ENCOUNTER — Encounter: Payer: Self-pay | Admitting: Family Medicine

## 2015-03-03 ENCOUNTER — Ambulatory Visit (INDEPENDENT_AMBULATORY_CARE_PROVIDER_SITE_OTHER): Payer: BLUE CROSS/BLUE SHIELD | Admitting: Family Medicine

## 2015-03-03 ENCOUNTER — Telehealth: Payer: Self-pay | Admitting: Family Medicine

## 2015-03-03 VITALS — BP 128/88 | Temp 98.6°F | Ht 61.5 in | Wt 170.0 lb

## 2015-03-03 DIAGNOSIS — R300 Dysuria: Secondary | ICD-10-CM | POA: Diagnosis not present

## 2015-03-03 DIAGNOSIS — N3000 Acute cystitis without hematuria: Secondary | ICD-10-CM

## 2015-03-03 LAB — POCT URINALYSIS DIPSTICK
PH UA: 7
SPEC GRAV UA: 1.015

## 2015-03-03 MED ORDER — FLUCONAZOLE 150 MG PO TABS: 150.0000 mg | ORAL_TABLET | Freq: Once | ORAL | Status: DC

## 2015-03-03 MED ORDER — CEFPROZIL 500 MG PO TABS: 500.0000 mg | ORAL_TABLET | Freq: Two times a day (BID) | ORAL | Status: AC

## 2015-03-03 MED ORDER — PHENAZOPYRIDINE HCL 200 MG PO TABS: 200.0000 mg | ORAL_TABLET | Freq: Three times a day (TID) | ORAL | Status: DC | PRN

## 2015-03-03 NOTE — Telephone Encounter (Signed)
Error

## 2015-03-03 NOTE — Progress Notes (Signed)
   Subjective:    Patient ID: Shelby Guerra, female    DOB: [birth year], 59 y.o.   MRN: [id number]  Urinary Tract Infection  This is a new problem. The current episode started in the past 7 days. The problem occurs intermittently. The problem has been unchanged. The quality of the pain is described as burning. The pain is moderate. There has been no fever. Associated symptoms comments: Abdominal pain . She has tried nothing for the symptoms. The treatment provided no relief.    PMH benign  Review of Systems    some dysuria no high fevers no flank pain no nausea vomiting or diarrhea Objective:   Physical Exam  Lungs are clear hearts were pulse normal abdomen soft minimal lower abdominal tenderness      Assessment & Plan:  UTI, culture taken, antibiotics prescribed, warning signs discussed. Follow-up if ongoing troubles.

## 2015-03-05 ENCOUNTER — Encounter: Payer: Self-pay | Admitting: Family Medicine

## 2015-03-05 LAB — URINE CULTURE

## 2015-03-30 ENCOUNTER — Encounter: Payer: Self-pay | Admitting: Adult Health

## 2015-03-30 ENCOUNTER — Ambulatory Visit (INDEPENDENT_AMBULATORY_CARE_PROVIDER_SITE_OTHER): Payer: BLUE CROSS/BLUE SHIELD | Admitting: Adult Health

## 2015-03-30 VITALS — BP 130/80 | HR 62 | Ht 60.75 in | Wt 175.5 lb

## 2015-03-30 DIAGNOSIS — R232 Flushing: Secondary | ICD-10-CM

## 2015-03-30 DIAGNOSIS — Z01419 Encounter for gynecological examination (general) (routine) without abnormal findings: Secondary | ICD-10-CM | POA: Diagnosis not present

## 2015-03-30 DIAGNOSIS — Z1211 Encounter for screening for malignant neoplasm of colon: Secondary | ICD-10-CM

## 2015-03-30 LAB — HEMOCCULT GUIAC POC 1CARD (OFFICE): Fecal Occult Blood, POC: NEGATIVE

## 2015-03-30 NOTE — Progress Notes (Signed)
Patient ID: Shelby Guerra, female   DOB: Aug 15, 1955, 59 y.o.   MRN: [id number] History of Present Illness: Marcell is a 59 year old black female in for a well woman gyn exam,she is sp hysterectomy.She works 3rd shift and complains of hot flashes. PCP Mickie Hillier.  Current Medications, Allergies, Past Medical History, Past Surgical History, Family History and Social History were reviewed in [employer] record.     Review of Systems: Patient denies any headaches, hearing loss, fatigue, blurred vision, shortness of breath, chest pain, abdominal pain, problems with bowel movements, urination, or intercourse(not having sex). No joint pain or mood swings.+hot flashes.    Physical Exam:BP 130/80 mmHg  Pulse 62  Ht 5' 0.75" (1.543 m)  Wt 175 lb 8 oz (79.606 kg)  BMI 33.44 kg/m2 General:  Well developed, well nourished, no acute distress Skin:  Warm and dry Neck:  Midline trachea, normal thyroid, good ROM, no lymphadenopathy Lungs; Clear to auscultation bilaterally Breast:  No dominant palpable mass, retraction, or nipple discharge Cardiovascular: Regular rate and rhythm Abdomen:  Soft, non tender, no hepatosplenomegaly Pelvic:  External genitalia is normal in appearance, no lesions.  The vagina is normal in appearance. Urethra has no lesions or masses. The cervix and uterus are absent.  No adnexal masses or tenderness noted.Bladder is non tender, no masses felt. Rectal: Good sphincter tone, no polyps, or hemorrhoids felt.  Hemoccult negative. Extremities/musculoskeletal:  No swelling or varicosities noted, no clubbing or cyanosis Psych:  No mood changes, alert and cooperative,seems happy Discussed Estrogen replacement and she declines for now,has had labs with PCP,but no TSH,will get one in near future.  Impression: Well woman gyn exam no pap Hot flashes    Plan: Physical in 1 year Mammogram yearly TSH 10/22 Colonoscopy per Dr Gala Romney Get flu shot

## 2015-03-30 NOTE — Patient Instructions (Signed)
Physical in 1 year Mammogram yearly Get TSH 10/22 Get flu shot

## 2015-04-02 LAB — TSH: TSH: 1.32 u[IU]/mL (ref 0.450–4.500)

## 2015-04-03 ENCOUNTER — Telehealth: Payer: Self-pay | Admitting: Adult Health

## 2015-04-03 NOTE — Telephone Encounter (Signed)
Left message TSH normal 

## 2015-04-13 ENCOUNTER — Other Ambulatory Visit: Payer: Self-pay | Admitting: Family Medicine

## 2015-04-27 ENCOUNTER — Telehealth: Payer: Self-pay | Admitting: Adult Health

## 2015-04-27 NOTE — Telephone Encounter (Signed)
Pt aware copy of TSH lab result at front desk. [place]

## 2015-04-27 NOTE — Telephone Encounter (Signed)
Pt called stating that she would a copy of her Blood results, pt states that she is able to pick them up tomorrow.

## 2015-05-03 ENCOUNTER — Telehealth: Payer: Self-pay | Admitting: Nurse Practitioner

## 2015-05-03 ENCOUNTER — Ambulatory Visit: Payer: BLUE CROSS/BLUE SHIELD | Admitting: Nurse Practitioner

## 2015-05-03 ENCOUNTER — Encounter: Payer: Self-pay | Admitting: Nurse Practitioner

## 2015-05-03 NOTE — Telephone Encounter (Signed)
PATIENT WAS A NO SHOW AND LETTER SENT  °

## 2015-05-31 ENCOUNTER — Ambulatory Visit: Payer: BLUE CROSS/BLUE SHIELD | Admitting: Family Medicine

## 2015-06-08 ENCOUNTER — Ambulatory Visit: Payer: BLUE CROSS/BLUE SHIELD | Admitting: Family Medicine

## 2015-07-25 ENCOUNTER — Encounter: Payer: Self-pay | Admitting: Family Medicine

## 2015-07-25 ENCOUNTER — Ambulatory Visit (INDEPENDENT_AMBULATORY_CARE_PROVIDER_SITE_OTHER): Payer: BLUE CROSS/BLUE SHIELD | Admitting: Family Medicine

## 2015-07-25 VITALS — BP 128/74 | Temp 99.8°F | Ht 61.5 in | Wt 167.0 lb

## 2015-07-25 DIAGNOSIS — J111 Influenza due to unidentified influenza virus with other respiratory manifestations: Secondary | ICD-10-CM | POA: Diagnosis not present

## 2015-07-25 MED ORDER — OSELTAMIVIR PHOSPHATE 75 MG PO CAPS: 75.0000 mg | ORAL_CAPSULE | Freq: Two times a day (BID) | ORAL | Status: DC

## 2015-07-25 MED ORDER — ONDANSETRON 8 MG PO TBDP: 8.0000 mg | ORAL_TABLET | Freq: Three times a day (TID) | ORAL | Status: DC | PRN

## 2015-07-25 NOTE — Patient Instructions (Signed)

## 2015-07-25 NOTE — Progress Notes (Signed)
   Subjective:    Patient ID: Shelby Guerra, female    DOB: [birth year], 60 y.o.   MRN: [id number]  Influenza This is a new problem. The current episode started in the past 7 days. Associated symptoms include abdominal pain, chills, coughing, fatigue, headaches, myalgias, nausea, a sore throat, vomiting and weakness. Associated symptoms comments: Runny nose,wheezing. Nothing aggravates the symptoms. Treatments tried: Nyquil. OTC cough suppressant     Patient states no other concerns this visit.  Review of Systems  Constitutional: Positive for chills and fatigue.  HENT: Positive for sore throat.   Respiratory: Positive for cough.   Gastrointestinal: Positive for nausea, vomiting and abdominal pain.  Musculoskeletal: Positive for myalgias.  Neurological: Positive for weakness and headaches.   Symptoms began over the last 36-48 hours increased coughing congestion not feeling well    Objective:   Physical Exam  Constitutional: She appears well-developed.  HENT:  Head: Normocephalic.  Nose: Nose normal.  Mouth/Throat: Oropharynx is clear and moist. No oropharyngeal exudate.  Neck: Neck supple.  Cardiovascular: Normal rate and normal heart sounds.   No murmur heard. Pulmonary/Chest: Effort normal and breath sounds normal. She has no wheezes.  Lymphadenopathy:    She has no cervical adenopathy.  Skin: Skin is warm and dry.  Nursing note and vitals reviewed.  Patient does not appear toxic but does look to feel ill       Assessment & Plan:  Influenza-the patient was diagnosed with influenza. Patient/family educated about the flu and warning signs to watch for. If difficulty breathing, severe neck pain and stiffness, cyanosis, disorientation, or progressive worsening then immediately get rechecked at that ER. If progressive symptoms be certain to be rechecked. Supportive measures such as Tylenol/ibuprofen was discussed. No aspirin use in children. And influenza home care instruction  sheet was given.

## 2015-07-28 ENCOUNTER — Telehealth: Payer: Self-pay | Admitting: Family Medicine

## 2015-07-28 MED ORDER — BENZONATATE 100 MG PO CAPS: 100.0000 mg | ORAL_CAPSULE | Freq: Four times a day (QID) | ORAL | Status: DC | PRN

## 2015-07-28 NOTE — Telephone Encounter (Signed)
Shelby Guerra 100 mg numb 28 one q six hrs prn 

## 2015-07-28 NOTE — Telephone Encounter (Signed)
Pt came in wanting to know if she can get something called in for her cough. Pt states that the Robitussin from otc is not working.      Barton, Olga

## 2015-07-28 NOTE — Telephone Encounter (Signed)
Seen 07/25/15 with flu

## 2015-07-28 NOTE — Telephone Encounter (Signed)
Rx sent electronically to pharmacy. Patient notified. 

## 2015-08-21 ENCOUNTER — Telehealth: Payer: Self-pay | Admitting: Family Medicine

## 2015-08-21 NOTE — Telephone Encounter (Signed)
ERROR

## 2015-08-22 ENCOUNTER — Ambulatory Visit: Payer: BLUE CROSS/BLUE SHIELD | Admitting: Family Medicine

## 2015-09-22 ENCOUNTER — Encounter: Payer: Self-pay | Admitting: Family Medicine

## 2015-09-22 ENCOUNTER — Ambulatory Visit (INDEPENDENT_AMBULATORY_CARE_PROVIDER_SITE_OTHER): Payer: BLUE CROSS/BLUE SHIELD | Admitting: Family Medicine

## 2015-09-22 VITALS — BP 122/76 | Ht 62.0 in | Wt 171.0 lb

## 2015-09-22 DIAGNOSIS — S46911A Strain of unspecified muscle, fascia and tendon at shoulder and upper arm level, right arm, initial encounter: Secondary | ICD-10-CM | POA: Diagnosis not present

## 2015-09-22 MED ORDER — ETODOLAC 400 MG PO TABS: ORAL_TABLET | ORAL | Status: DC

## 2015-09-22 MED ORDER — CHLORZOXAZONE 500 MG PO TABS: 500.0000 mg | ORAL_TABLET | Freq: Three times a day (TID) | ORAL | Status: DC | PRN

## 2015-09-22 NOTE — Progress Notes (Signed)
   Subjective:    Patient ID: Shelby Guerra, female    DOB: [birth year], 60 y.o.   MRN: [id number]  Neck Pain  This is a new problem. Episode onset: 3weeks ago. The pain is associated with nothing. The pain is present in the right side ( right arm). Worse during: worse at night. Treatments tried: ibuprofen and aleve. The treatment provided no relief.   Neck and shoulder worse on the right side  No sig injury no overtime  No sig change in duties  Pt rtried aleave and ibuprofen   Rad to mid arm   Movement of neck not painful   Side improvement with over-the-counter medicine pain does not radiate below the elbow     Review of Systems  Musculoskeletal: Positive for neck pain.   no headache no chest pain     Objective:   Physical Exam Alert no acute distress lungs clear heart rare rhythm neck supple shoulder good range of motion no some pain with complete rotation. No true impingement sign. Some suprascapular tenderness to deep palpation       Assessment & Plan:  Impression shoulder strain doubt neuropathic element discussed plan trial of anti-inflammatory medicine range of motion exercises discussed local measures discussed recheck her persists WSL

## 2015-10-05 ENCOUNTER — Telehealth: Payer: Self-pay | Admitting: Family Medicine

## 2015-10-05 DIAGNOSIS — S46911D Strain of unspecified muscle, fascia and tendon at shoulder and upper arm level, right arm, subsequent encounter: Secondary | ICD-10-CM

## 2015-10-05 MED ORDER — TIZANIDINE HCL 4 MG PO CAPS: 4.0000 mg | ORAL_CAPSULE | Freq: Three times a day (TID) | ORAL | Status: DC | PRN

## 2015-10-05 MED ORDER — NABUMETONE 750 MG PO TABS: ORAL_TABLET | ORAL | Status: DC

## 2015-10-05 NOTE — Telephone Encounter (Signed)
relafen 750 bid 28, zanaflex 4 one tid 36, if persist may need ortho ref

## 2015-10-05 NOTE — Telephone Encounter (Signed)
Pt states she was seen about 2 weeks ago, her neck & arm pain are no better & meds given don't seem to be helping & not resting well  Please advise   [place]

## 2015-10-05 NOTE — Telephone Encounter (Signed)
Patient was seen 09/22/15, diagnosed with right shoulder strain, given Lodine and Chlorzoxazone.

## 2015-10-05 NOTE — Telephone Encounter (Signed)
Spoke with patient and informed her per Dr. Mickie Hillier- we are sending over relafen 750 bid 28, zanaflex 4 one tid 36, if persist may need ortho ref. Informed patient to stop Lodine and Chlorzoxazone. Patient verbalized understanding.

## 2015-10-20 ENCOUNTER — Other Ambulatory Visit: Payer: Self-pay | Admitting: Family Medicine

## 2015-10-20 DIAGNOSIS — Z1231 Encounter for screening mammogram for malignant neoplasm of breast: Secondary | ICD-10-CM

## 2015-11-02 ENCOUNTER — Telehealth: Payer: Self-pay | Admitting: Family Medicine

## 2015-11-02 NOTE — Telephone Encounter (Signed)
Patient is currently taking Lodine 400 mg and chlorzoxazone 500 mg.

## 2015-11-02 NOTE — Telephone Encounter (Signed)
Notified patient that she needs office visit. Transferred patient to front desk to schedule appointment.

## 2015-11-02 NOTE — Telephone Encounter (Signed)
Patient was prescribed meds for her arm and neck and it doesn't seem to be helping.  She wants to know if there is anything else that can be done or something stronger that can be prescribed.  Please advise.   [place]

## 2015-11-02 NOTE — Telephone Encounter (Signed)
Pt called for addtnl meds a month ago, tinme for re eval may need scan

## 2015-11-07 ENCOUNTER — Ambulatory Visit: Payer: BLUE CROSS/BLUE SHIELD | Admitting: Family Medicine

## 2015-11-16 ENCOUNTER — Telehealth: Payer: Self-pay | Admitting: Family Medicine

## 2015-11-16 NOTE — Telephone Encounter (Signed)
Pt is

## 2015-11-17 ENCOUNTER — Encounter: Payer: Self-pay | Admitting: Family Medicine

## 2015-11-17 ENCOUNTER — Ambulatory Visit (INDEPENDENT_AMBULATORY_CARE_PROVIDER_SITE_OTHER): Payer: BLUE CROSS/BLUE SHIELD | Admitting: Family Medicine

## 2015-11-17 VITALS — BP 142/88 | Temp 98.1°F | Ht 62.0 in | Wt 177.0 lb

## 2015-11-17 DIAGNOSIS — M25511 Pain in right shoulder: Secondary | ICD-10-CM | POA: Diagnosis not present

## 2015-11-17 DIAGNOSIS — R350 Frequency of micturition: Secondary | ICD-10-CM

## 2015-11-17 LAB — POCT URINALYSIS DIPSTICK
Spec Grav, UA: 1.01
pH, UA: 7

## 2015-11-17 NOTE — Progress Notes (Signed)
   Subjective:    Patient ID: Shelby Guerra, female    DOB: 06-06-1956, 60 y.o.   MRN: [id number]  Neck Pain  This is a new problem. The current episode started more than 1 month ago (one month). Treatments tried: muscle relaxer.   Frequent urination. Started today.   Shoulder and arm aching now for two months  Pain in shoulder definitely owrse with motions Shoulder pain continues to persist. Definitely affecting patient's ability to do what she wants to do both at work and home. Minimal radiation beyond the shoulder Ache rd to hadn at   Last anti inflam medd seemed to cause diarhea  Review of Systems  Musculoskeletal: Positive for neck pain.   No fever no chills no vomiting    Objective:   Physical Exam  Alert vitals stable mild malaise shoulder plus minus impingement sign negative deltoid tenderness no deformity neck supple miles suprascapular tenderness distal hand strength sensation all intact      Assessment & Plan:  Impression subacute shoulder pain discussed not improving with standard prescription agents point plus time. Urinalysis within normal limits just starting [place] frequency today not enough evidence to treat at this time discussed plan x-ray. Anti-inflammatory medicine represcribed. Hold off on muscle spasm orthopedic referral [facility]

## 2015-11-18 ENCOUNTER — Ambulatory Visit (HOSPITAL_COMMUNITY)
Admission: RE | Admit: 2015-11-18 | Discharge: 2015-11-18 | Disposition: A | Discharge status: Home / Self Care | Payer: BLUE CROSS/BLUE SHIELD | Source: Ambulatory Visit | Attending: Family Medicine | Admitting: Family Medicine

## 2015-11-18 DIAGNOSIS — M25511 Pain in right shoulder: Secondary | ICD-10-CM | POA: Diagnosis not present

## 2015-11-22 ENCOUNTER — Encounter: Payer: Self-pay | Admitting: Family Medicine

## 2015-11-30 ENCOUNTER — Ambulatory Visit (HOSPITAL_COMMUNITY)
Admission: RE | Admit: 2015-11-30 | Discharge: 2015-11-30 | Disposition: A | Discharge status: Home / Self Care | Payer: BLUE CROSS/BLUE SHIELD | Source: Ambulatory Visit | Attending: Family Medicine | Admitting: Family Medicine

## 2015-11-30 DIAGNOSIS — Z1231 Encounter for screening mammogram for malignant neoplasm of breast: Secondary | ICD-10-CM | POA: Insufficient documentation

## 2015-12-11 ENCOUNTER — Ambulatory Visit (INDEPENDENT_AMBULATORY_CARE_PROVIDER_SITE_OTHER): Payer: BLUE CROSS/BLUE SHIELD | Admitting: Family Medicine

## 2015-12-11 ENCOUNTER — Encounter: Payer: Self-pay | Admitting: Family Medicine

## 2015-12-11 VITALS — BP 120/80 | Temp 98.9°F | Ht 61.5 in | Wt 168.2 lb

## 2015-12-11 DIAGNOSIS — J019 Acute sinusitis, unspecified: Secondary | ICD-10-CM | POA: Diagnosis not present

## 2015-12-11 DIAGNOSIS — B9689 Other specified bacterial agents as the cause of diseases classified elsewhere: Secondary | ICD-10-CM

## 2015-12-11 MED ORDER — AMOXICILLIN 500 MG PO TABS: 500.0000 mg | ORAL_TABLET | Freq: Three times a day (TID) | ORAL | Status: DC

## 2015-12-11 NOTE — Progress Notes (Signed)
   Subjective:    Patient ID: Shelby Guerra, female    DOB: March 04, 1956, 60 y.o.   MRN: [id number]  Cough This is a new problem. The current episode started in the past 7 days. Associated symptoms include chills, a fever, headaches, rhinorrhea and a sore throat. Associated symptoms comments: vomiting. Treatments tried: Mucinex    Patient states no other concerns this visit.  Review of Systems  Constitutional: Positive for fever, chills and fatigue.  HENT: Positive for rhinorrhea and sore throat.   Respiratory: Positive for cough.   Neurological: Positive for headaches.       Objective:   Physical Exam  Constitutional: She appears well-developed.  HENT:  Head: Normocephalic.  Nose: Nose normal.  Mouth/Throat: Oropharynx is clear and moist. No oropharyngeal exudate.  Neck: Neck supple.  Cardiovascular: Normal rate and normal heart sounds.   No murmur heard. Pulmonary/Chest: Effort normal and breath sounds normal. She has no wheezes.  Lymphadenopathy:    She has no cervical adenopathy.  Skin: Skin is warm and dry.  Nursing note and vitals reviewed.   No sign and pneumonia no need for x-rays or lab work      Assessment & Plan:  Patient was seen today for upper respiratory illness. It is felt that the patient is dealing with sinusitis. Antibiotics were prescribed today. Importance of compliance with medication was discussed. Symptoms should gradually resolve over the course of the next several days. If high fevers, progressive illness, difficulty breathing, worsening condition or failure for symptoms to improve over the next several days then the patient is to follow-up. If any emergent conditions the patient is to follow-up in the emergency department otherwise to follow-up in the office. I believe this patient start off viral illness over Thursday and Friday never the past few days is developing early sinus infection antibiotics prescribed amoxicillin 3 times a day for 10 days if  high fevers difficulty breathing or worse follow-up

## 2015-12-14 DIAGNOSIS — M7551 Bursitis of right shoulder: Secondary | ICD-10-CM | POA: Diagnosis not present

## 2016-01-09 ENCOUNTER — Telehealth: Payer: Self-pay | Admitting: Family Medicine

## 2016-01-09 DIAGNOSIS — Z79899 Other long term (current) drug therapy: Secondary | ICD-10-CM

## 2016-01-09 DIAGNOSIS — E785 Hyperlipidemia, unspecified: Secondary | ICD-10-CM

## 2016-01-09 NOTE — Telephone Encounter (Signed)
Lip liv met 7 plus chronic visit [facility] chronic

## 2016-01-09 NOTE — Telephone Encounter (Signed)
Patient wants to know if she is due for blood work? °

## 2016-01-10 ENCOUNTER — Encounter: Payer: Self-pay | Admitting: Family Medicine

## 2016-01-10 ENCOUNTER — Ambulatory Visit (INDEPENDENT_AMBULATORY_CARE_PROVIDER_SITE_OTHER): Payer: BLUE CROSS/BLUE SHIELD | Admitting: Family Medicine

## 2016-01-10 ENCOUNTER — Ambulatory Visit (HOSPITAL_COMMUNITY)
Admission: RE | Admit: 2016-01-10 | Discharge: 2016-01-10 | Disposition: A | Discharge status: Home / Self Care | Payer: BLUE CROSS/BLUE SHIELD | Source: Ambulatory Visit | Attending: Family Medicine | Admitting: Family Medicine

## 2016-01-10 VITALS — BP 132/80 | Ht 62.56 in | Wt 168.0 lb

## 2016-01-10 DIAGNOSIS — M545 Low back pain: Secondary | ICD-10-CM

## 2016-01-10 DIAGNOSIS — M5136 Other intervertebral disc degeneration, lumbar region: Secondary | ICD-10-CM | POA: Diagnosis not present

## 2016-01-10 DIAGNOSIS — Z79899 Other long term (current) drug therapy: Secondary | ICD-10-CM | POA: Diagnosis not present

## 2016-01-10 DIAGNOSIS — E785 Hyperlipidemia, unspecified: Secondary | ICD-10-CM | POA: Diagnosis not present

## 2016-01-10 MED ORDER — NAPROXEN 500 MG PO TABS: 500.0000 mg | ORAL_TABLET | Freq: Two times a day (BID) | ORAL | 0 refills | Status: DC

## 2016-01-10 MED ORDER — OXYCODONE-ACETAMINOPHEN 5-325 MG PO TABS: ORAL_TABLET | ORAL | 0 refills | Status: DC

## 2016-01-10 NOTE — Progress Notes (Signed)
   Subjective:    Patient ID: Shelby Guerra, female    DOB: 1955/11/08, 60 y.o.   MRN: [id number]  HPI [place] on sidewalk two days ago. Taking groceries out of car and tripped over a stump in the ground.  Having low back pain, right knee pain and abdominal pain on left lower side. Taking aleve. Mild relief.    patient ntes all pain has now improved consideraby except for low back.deep ache at times Nt responding wll to over-he-counter medicnes. No vomiting nofever   legseel bter Review of Systems No headache, no major weight loss or weight gain, no chest pain no back pain abdominal pain no change in bowel habits complete ROS otherwise negative     Objective:   Physical Exam  Alert vitals stable, NAD. Blood pressure good on repeat. HEENT normal. Lungs clear. Heart regular rate and rhythm.  Low back tender to percussion low lumbar region      Assessment & Plan:   impression fall with multiple cotusion lan anti-inlammatory medicine precribed. Oxycodoe whe necessary for ore severe pain. X-ray of the involed area [facility]

## 2016-01-10 NOTE — Telephone Encounter (Signed)
Blood work ordered in [employer]. Patient notified and advised she needs a chronic office visit. Patient verbalized understanding.

## 2016-01-11 DIAGNOSIS — Z0289 Encounter for other administrative examinations: Secondary | ICD-10-CM

## 2016-01-11 LAB — LIPID PANEL
CHOLESTEROL TOTAL: 214 mg/dL — AB (ref 100–199)
Chol/HDL Ratio: 3.8 ratio units (ref 0.0–4.4)
HDL: 56 mg/dL (ref >39)
LDL CALC: 147 mg/dL — AB (ref 0–99)
TRIGLYCERIDES: 54 mg/dL (ref 0–149)
VLDL CHOLESTEROL CAL: 11 mg/dL (ref 5–40)

## 2016-01-11 LAB — HEPATIC FUNCTION PANEL
ALBUMIN: 3.9 g/dL (ref 3.5–5.5)
ALK PHOS: 80 IU/L (ref 39–117)
ALT: 16 IU/L (ref 0–32)
AST: 18 IU/L (ref 0–40)
Bilirubin Total: 0.2 mg/dL (ref 0.0–1.2)
Bilirubin, Direct: 0.06 mg/dL (ref 0.00–0.40)
Total Protein: 6.6 g/dL (ref 6.0–8.5)

## 2016-01-11 LAB — BASIC METABOLIC PANEL
BUN/Creatinine Ratio: 15 (ref 9–23)
BUN: 13 mg/dL (ref 6–24)
CALCIUM: 9.4 mg/dL (ref 8.7–10.2)
CHLORIDE: 103 mmol/L (ref 96–106)
CO2: 25 mmol/L (ref 18–29)
Creatinine, Ser: 0.84 mg/dL (ref 0.57–1.00)
GFR calc Af Amer: 89 mL/min/{1.73_m2} (ref >59)
GFR, EST NON AFRICAN AMERICAN: 77 mL/min/{1.73_m2} (ref >59)
GLUCOSE: 80 mg/dL (ref 65–99)
POTASSIUM: 4.1 mmol/L (ref 3.5–5.2)
SODIUM: 142 mmol/L (ref 134–144)

## 2016-01-12 ENCOUNTER — Encounter: Payer: Self-pay | Admitting: Family Medicine

## 2016-02-29 DIAGNOSIS — M47812 Spondylosis without myelopathy or radiculopathy, cervical region: Secondary | ICD-10-CM | POA: Diagnosis not present

## 2016-03-05 ENCOUNTER — Encounter (HOSPITAL_COMMUNITY): Payer: Self-pay

## 2016-03-05 ENCOUNTER — Emergency Department (HOSPITAL_COMMUNITY): Payer: BLUE CROSS/BLUE SHIELD

## 2016-03-05 ENCOUNTER — Emergency Department (HOSPITAL_COMMUNITY)
Admission: EM | Admit: 2016-03-05 | Discharge: 2016-03-05 | Disposition: A | Discharge status: Home / Self Care | Payer: BLUE CROSS/BLUE SHIELD | Attending: Emergency Medicine | Admitting: Emergency Medicine

## 2016-03-05 DIAGNOSIS — J9811 Atelectasis: Secondary | ICD-10-CM

## 2016-03-05 DIAGNOSIS — I10 Essential (primary) hypertension: Secondary | ICD-10-CM | POA: Insufficient documentation

## 2016-03-05 DIAGNOSIS — K2100 Gastro-esophageal reflux disease with esophagitis, without bleeding: Secondary | ICD-10-CM

## 2016-03-05 DIAGNOSIS — R079 Chest pain, unspecified: Secondary | ICD-10-CM | POA: Diagnosis not present

## 2016-03-05 DIAGNOSIS — R197 Diarrhea, unspecified: Secondary | ICD-10-CM | POA: Insufficient documentation

## 2016-03-05 DIAGNOSIS — R072 Precordial pain: Secondary | ICD-10-CM | POA: Diagnosis present

## 2016-03-05 DIAGNOSIS — Z79899 Other long term (current) drug therapy: Secondary | ICD-10-CM | POA: Diagnosis not present

## 2016-03-05 DIAGNOSIS — R0789 Other chest pain: Secondary | ICD-10-CM | POA: Diagnosis not present

## 2016-03-05 DIAGNOSIS — K219 Gastro-esophageal reflux disease without esophagitis: Secondary | ICD-10-CM | POA: Diagnosis not present

## 2016-03-05 LAB — COMPREHENSIVE METABOLIC PANEL
ALK PHOS: 66 U/L (ref 38–126)
ALT: 16 U/L (ref 14–54)
AST: 18 U/L (ref 15–41)
Albumin: 3.7 g/dL (ref 3.5–5.0)
Anion gap: 7 (ref 5–15)
BILIRUBIN TOTAL: 0.1 mg/dL — AB (ref 0.3–1.2)
BUN: 21 mg/dL — AB (ref 6–20)
CALCIUM: 8.6 mg/dL — AB (ref 8.9–10.3)
CO2: 22 mmol/L (ref 22–32)
CREATININE: 0.9 mg/dL (ref 0.44–1.00)
Chloride: 106 mmol/L (ref 101–111)
GFR calc Af Amer: >60 mL/min (ref >60)
Glucose, Bld: 175 mg/dL — ABNORMAL HIGH (ref 65–99)
Potassium: 3.1 mmol/L — ABNORMAL LOW (ref 3.5–5.1)
Sodium: 135 mmol/L (ref 135–145)
TOTAL PROTEIN: 6.7 g/dL (ref 6.5–8.1)

## 2016-03-05 LAB — CBC WITH DIFFERENTIAL/PLATELET
BASOS ABS: 0 10*3/uL (ref 0.0–0.1)
Basophils Relative: 0 %
Eosinophils Absolute: 0 10*3/uL (ref 0.0–0.7)
Eosinophils Relative: 0 %
HEMATOCRIT: 39.3 % (ref 36.0–46.0)
Hemoglobin: 12.8 g/dL (ref 12.0–15.0)
LYMPHS PCT: 20 %
Lymphs Abs: 1.6 10*3/uL (ref 0.7–4.0)
MCH: 28.3 pg (ref 26.0–34.0)
MCHC: 32.6 g/dL (ref 30.0–36.0)
MCV: 86.9 fL (ref 78.0–100.0)
MONO ABS: 0.6 10*3/uL (ref 0.1–1.0)
Monocytes Relative: 8 %
NEUTROS ABS: 5.8 10*3/uL (ref 1.7–7.7)
Neutrophils Relative %: 72 %
Platelets: 229 10*3/uL (ref 150–400)
RBC: 4.52 MIL/uL (ref 3.87–5.11)
RDW: 14.4 % (ref 11.5–15.5)
WBC: 8 10*3/uL (ref 4.0–10.5)

## 2016-03-05 LAB — LIPASE, BLOOD: LIPASE: 23 U/L (ref 11–51)

## 2016-03-05 LAB — I-STAT TROPONIN, ED: Troponin i, poc: 0 ng/mL (ref 0.00–0.08)

## 2016-03-05 MED ORDER — ASPIRIN 81 MG PO CHEW
324.0000 mg | CHEWABLE_TABLET | Freq: Once | ORAL | Status: AC
  Administered 2016-03-05: 324 mg via ORAL
  Filled 2016-03-05: qty 4

## 2016-03-05 MED ORDER — SUCRALFATE 1 GM/10ML PO SUSP: 1.0000 g | Freq: Three times a day (TID) | ORAL | 0 refills | Status: DC | PRN

## 2016-03-05 MED ORDER — AZITHROMYCIN 250 MG PO TABS: 250.0000 mg | ORAL_TABLET | Freq: Every day | ORAL | 0 refills | Status: DC

## 2016-03-05 MED ORDER — GI COCKTAIL ~~LOC~~
30.0000 mL | Freq: Once | ORAL | Status: AC
  Administered 2016-03-05: 30 mL via ORAL
  Filled 2016-03-05: qty 30

## 2016-03-05 MED ORDER — RANITIDINE HCL 150 MG PO TABS: 150.0000 mg | ORAL_TABLET | Freq: Two times a day (BID) | ORAL | 0 refills | Status: DC

## 2016-03-05 NOTE — ED Provider Notes (Addendum)
[facility] DEPT Provider Note   CSN: [id number] Arrival date & time: 03/05/16  2146   By signing my name below, I, [name], attest that this documentation has been prepared under the direction and in the presence of Duffy Bruce, MD. Electronically Signed: Irene Pap, ED Scribe. 03/05/16. 10:02 PM.  History   Chief Complaint Chief Complaint  Patient presents with  . Chest Pain   The history is provided by the patient. No language interpreter was used.   HPI Comments: Shelby Guerra is a 60 y.o. female with a hx of high cholesterol and HTN who presents to the Emergency Department complaining of gradually worsening, "nagging," pressured, non-radiating mid-sternal chest pain onset two days ago. Pt reports associated nausea. Pt has also been complaining of diarrhea. Pt reports worsening pain with being active. She has relief with resting but worsens when lying flat. Pt has taken Pepto-Bismol for her symptoms to no relief. She denies diaphoresis, cough, leg swelling, or SOB.   Past Medical History:  Diagnosis Date  . GERD (gastroesophageal reflux disease)   . High cholesterol   . Hot flashes 03/10/2013  . Hypertension   . Impaired fasting glucose   . Kidney stone    oxalate/uric acid  . Moody 03/14/2014    Patient Active Problem List   Diagnosis Date Noted  . Depression 02/01/2015  . Moody 03/14/2014  . Hot flashes 03/10/2013  . GERD 11/29/2009  . CONSTIPATION, CHRONIC 11/29/2009  . NAUSEA, CHRONIC 11/29/2009    Past Surgical History:  Procedure Laterality Date  . ABDOMINAL HYSTERECTOMY    . COLONOSCOPY    12/13/2008   Dr. Gala Romney: Normal rectum and colon  . INGUINAL HERNIA REPAIR    . TUBAL LIGATION      OB History    Gravida Para Term Preterm AB Living   1 1       1    SAB TAB Ectopic Multiple Live Births           1       Home Medications    Prior to Admission medications   Medication Sig Start Date End Date Taking? Authorizing Provider    pantoprazole (PROTONIX) 40 MG tablet Take 40 mg by mouth daily.  08/17/15  Yes Historical Provider, MD  predniSONE (DELTASONE) 10 MG tablet Take 10 mg by mouth daily. For 4 days starting on 03/06/2016 02/29/16  Yes Historical Provider, MD  venlafaxine XR (EFFEXOR-XR) 150 MG 24 hr capsule Take 1 capsule (150 mg total) by mouth daily with breakfast. 02/01/15  Yes Mikey Kirschner, MD  azithromycin (ZITHROMAX) 250 MG tablet Take 1 tablet (250 mg total) by mouth daily. Take first 2 tablets together, then 1 every day until finished. 03/05/16   Duffy Bruce, MD  ranitidine (ZANTAC) 150 MG tablet Take 1 tablet (150 mg total) by mouth 2 (two) times daily. 03/05/16 03/15/16  Duffy Bruce, MD  sucralfate (CARAFATE) 1 GM/10ML suspension Take 10 mLs (1 g total) by mouth 3 (three) times daily as needed (heartburn). 03/05/16   Duffy Bruce, MD    Family History Family History  Problem Relation Age of Onset  . Hypertension Brother   . Kidney failure Brother     had kidney transplant  . Hyperlipidemia Brother   . Hypertension Sister   . Hyperlipidemia Sister   . Cancer Sister     cervical  . Hypertension Brother   . Kidney failure Father   . Cancer Mother   . Colon cancer Neg  Hx     Social History Social History  Substance Use Topics  . Smoking status: Never Smoker  . Smokeless tobacco: Never Used  . Alcohol use No     Allergies   Codeine and Propoxyphene n-acetaminophen   Review of Systems Review of Systems  Constitutional: Negative for chills, diaphoresis, fatigue and fever.  HENT: Negative for congestion and rhinorrhea.   Eyes: Negative for visual disturbance.  Respiratory: Negative for cough, shortness of breath and wheezing.   Cardiovascular: Positive for chest pain. Negative for leg swelling.  Gastrointestinal: Positive for abdominal pain, diarrhea and nausea. Negative for vomiting.  Genitourinary: Negative for dysuria and flank pain.  Musculoskeletal: Negative for neck pain and  neck stiffness.  Skin: Negative for rash and wound.  Allergic/Immunologic: Negative for immunocompromised state.  Neurological: Negative for syncope, weakness and headaches.  All other systems reviewed and are negative.  Physical Exam Updated Vital Signs BP 115/70   Pulse 65   Temp 97.8 F (36.6 C) (Oral)   Resp 20   Ht 5\' 1"  (1.549 m)   Wt 180 lb (81.6 kg)   SpO2 100%   BMI 34.01 kg/m   Physical Exam  Constitutional: She is oriented to person, place, and time. She appears well-developed and well-nourished. No distress.  HENT:  Head: Normocephalic and atraumatic.  Eyes: Conjunctivae are normal.  Neck: Neck supple.  Cardiovascular: Normal rate, regular rhythm and normal heart sounds.  Exam reveals no friction rub.   No murmur heard. Pulmonary/Chest: Effort normal and breath sounds normal. No respiratory distress. She has no wheezes. She has no rales.  Abdominal: Soft. Bowel sounds are normal. She exhibits no distension. There is tenderness (minimal, epigastric).  Musculoskeletal: She exhibits no edema.  Neurological: She is alert and oriented to person, place, and time. She exhibits normal muscle tone.  Skin: Skin is warm. Capillary refill takes less than 2 seconds.  Psychiatric: She has a normal mood and affect.  Nursing note and vitals reviewed.    ED Treatments / Results  DIAGNOSTIC STUDIES: Oxygen Saturation is  by my interpretation.    COORDINATION OF CARE: 10:02 PM-Discussed treatment plan which includes labs, EKG, and x-ray with pt at bedside and pt agreed to plan.    Labs (all labs ordered are listed, but only abnormal results are displayed) Labs Reviewed  COMPREHENSIVE METABOLIC PANEL - Abnormal; Notable for the following:       Result Value   Potassium 3.1 (*)    Glucose, Bld 175 (*)    BUN 21 (*)    Calcium 8.6 (*)    Total Bilirubin 0.1 (*)    All other components within normal limits  CBC WITH DIFFERENTIAL/PLATELET  LIPASE, BLOOD  I-STAT  TROPOININ, ED    EKG  EKG Interpretation  Date/Time:  Tuesday March 05 2016 21:56:16 EDT Ventricular Rate:  60 PR Interval:    QRS Duration: 106 QT Interval:  388 QTC Calculation: 388 R Axis:   48 Text Interpretation:  Sinus rhythm Borderline short PR interval Probable left atrial enlargement Non-specific ST changes in inferior leads, no comparison available Confirmed by Hetty Linhart MD, Lysbeth Galas [phone]) on 03/06/2016 10:01:53 AM       Radiology Dg Chest 2 View  Result Date: 03/05/2016 CLINICAL DATA:  Acute onset of dull central chest pain, nausea and diarrhea. Initial encounter. EXAM: CHEST  2 VIEW COMPARISON:  Chest radiograph performed 01/30/2015 FINDINGS: The lungs are well-aerated. Mild left basilar airspace opacity raises question for mild pneumonia, though atelectasis  might have a similar appearance. There is no evidence of pleural effusion or pneumothorax. The heart is borderline enlarged. No acute osseous abnormalities are seen. IMPRESSION: Mild left basilar opacity raises question for mild pneumonia, though atelectasis might have a similar appearance. Borderline cardiomegaly. Electronically Signed   By: Garald Balding M.D.   On: 03/05/2016 22:53    Procedures Procedures (including critical care time)  Medications Ordered in ED Medications  gi cocktail (Maalox,Lidocaine,Donnatal) (30 mLs Oral Given 03/05/16 2227)  aspirin chewable tablet 324 mg (324 mg Oral Given 03/05/16 2226)     Initial Impression / Assessment and Plan / ED Course  I have reviewed the triage vital signs and the nursing notes.  Pertinent labs & imaging results that were available during my care of the patient were reviewed by me and considered in my medical decision making (see chart for details).  Clinical Course    60 yo AAF with PMHx of HTN, HLD who p/w atypical aching, burning, substernal/epigastric pain that has been constant, unchanging x 2 days. Associated with nausea, diarrhea, and worse with  lying flat. History, exam is most c/w likely acute GERD/gastritis, with possible viral GI illness. Will trial GI cocktail. No evidence of peritonitis or obstruction on exam. Otherwise, must consider ACS given chest pain but low suspicion based on history. EKG shows some non-specific inferior ST changes but no ST elevations, no old tracing to compare. HEART score is <3. Will check troponin, monitor in ED.   Labs reviewed and are overall reassuring. CBC shows no leukocytosis or anemia. CMP without significant elevation in LFTs and bili - doubt cholecystitis. Normal lipase. Sx completely resolved with GI cocktail in ED. Trop negative, which is reassuring in setting of constant, unchanging pain x 2 days.  Discussed labs, imaging, and diagnosis with pt. I discussed that while single trop in setting of constant unchanged pain is reassuring and technically adequate (given unchanged pain), a full evaluation includes delta troponin, repeat EKG, and monitoring. Pt states her sx are completely resolved and would like to go home. Believe this is reasonable based on shared decision making discussion with myself, pt, and family. Advised strict return precautions.  Of note, CXR read as ? Atelectasis versus PNA. Will give azithro with instructions go begin if she develops cough, sputum production, or fevers.  Final Clinical Impressions(s) / ED Diagnoses   Final diagnoses:  Atypical chest pain  Gastroesophageal reflux disease with esophagitis  Atelectasis    New Prescriptions Discharge Medication List as of 03/06/2016  8:46 AM    START taking these medications   Details  azithromycin (ZITHROMAX) 250 MG tablet Take 1 tablet (250 mg total) by mouth daily. Take first 2 tablets together, then 1 every day until finished., Starting Tue 03/05/2016, Print    ranitidine (ZANTAC) 150 MG tablet Take 1 tablet (150 mg total) by mouth 2 (two) times daily., Starting Tue 03/05/2016, Until Fri 03/15/2016, Print    sucralfate  (CARAFATE) 1 GM/10ML suspension Take 10 mLs (1 g total) by mouth 3 (three) times daily as needed (heartburn)., Starting Tue 03/05/2016, Print       I personally performed the services described in this documentation, which was scribed in my presence. The recorded information has been reviewed and is accurate.     Duffy Bruce, MD 03/06/16 1044    Duffy Bruce, MD 03/06/16 1045

## 2016-03-05 NOTE — ED Triage Notes (Signed)
Mid-sternal chest pain X2 days with nausea. Also c/o diarrhea

## 2016-03-06 NOTE — ED Notes (Signed)
Pt reports she feels better and wants to return to work today instead of tomorrow.  Spoke with Marcene Brawn PA and ok for pt to return today.

## 2016-03-12 ENCOUNTER — Ambulatory Visit (HOSPITAL_COMMUNITY): Payer: BLUE CROSS/BLUE SHIELD | Admitting: Physical Therapy

## 2016-03-12 ENCOUNTER — Encounter (HOSPITAL_COMMUNITY): Payer: Self-pay

## 2016-03-14 ENCOUNTER — Ambulatory Visit (INDEPENDENT_AMBULATORY_CARE_PROVIDER_SITE_OTHER): Payer: BLUE CROSS/BLUE SHIELD | Admitting: Orthopaedic Surgery

## 2016-03-14 DIAGNOSIS — M542 Cervicalgia: Secondary | ICD-10-CM | POA: Diagnosis not present

## 2016-04-01 ENCOUNTER — Ambulatory Visit (INDEPENDENT_AMBULATORY_CARE_PROVIDER_SITE_OTHER): Payer: BLUE CROSS/BLUE SHIELD | Admitting: Adult Health

## 2016-04-01 ENCOUNTER — Encounter: Payer: Self-pay | Admitting: Adult Health

## 2016-04-01 VITALS — BP 132/70 | HR 66 | Ht 60.5 in | Wt 175.4 lb

## 2016-04-01 DIAGNOSIS — Z01419 Encounter for gynecological examination (general) (routine) without abnormal findings: Secondary | ICD-10-CM

## 2016-04-01 DIAGNOSIS — Z1212 Encounter for screening for malignant neoplasm of rectum: Secondary | ICD-10-CM | POA: Diagnosis not present

## 2016-04-01 LAB — HEMOCCULT GUIAC POC 1CARD (OFFICE): Fecal Occult Blood, POC: NEGATIVE

## 2016-04-01 MED ORDER — VENLAFAXINE HCL ER 150 MG PO CP24: 150.0000 mg | ORAL_CAPSULE | Freq: Every day | ORAL | 11 refills | Status: DC

## 2016-04-01 NOTE — Patient Instructions (Addendum)
Physical in 1 year Mammogram yearly Labs with PCP Colonoscopy per GI

## 2016-04-01 NOTE — Progress Notes (Signed)
Patient ID: Shelby Guerra, female   DOB: [birth year], 60 y.o.   MRN: [id number] History of Present Illness: Shelby Guerra is a 60 year old black female in for well woman gyn exam,she is sp hysterectomy.She has gotten flu shot this year already. PCP is Dr Wolfgang Phoenix.   Current Medications, Allergies, Past Medical History, Past Surgical History, Family History and Social History were reviewed in [employer] record.     Review of Systems:  Patient denies any headaches, hearing loss, fatigue, blurred vision, shortness of breath, chest pain, abdominal pain, problems with bowel movements, urination, or intercourse(not currently having sex). No joint pain or mood swings.   Physical Exam:BP 132/70   Pulse 66   Ht 5' 0.5" (1.537 m)   Wt 175 lb 6.4 oz (79.6 kg)   BMI 33.69 kg/m  General:  Well developed, well nourished, no acute distress Skin:  Warm and dry Neck:  Midline trachea, normal thyroid, good ROM, no lymphadenopathy Lungs; Clear to auscultation bilaterally Breast:  No dominant palpable mass, retraction, or nipple discharge,left nipple inverted(lon standing) Cardiovascular: Regular rate and rhythm Abdomen:  Soft, non tender, no hepatosplenomegaly Pelvic:  External genitalia is normal in appearance, no lesions.  The vagina is normal in appearance. Urethra has no lesions or masses. The cervix and uterus are absent.  No adnexal masses or tenderness noted.Bladder is non tender, no masses felt. Rectal: Good sphincter tone, no polyps, or hemorrhoids felt.  Hemoccult negative. Extremities/musculoskeletal:  No swelling or varicosities noted, no clubbing or cyanosis Psych:  No mood changes, alert and cooperative,seems happy PHQ 2 score 1.She is stressed at work at times.Discussed adding Buspar to Effexor, but try taking Effexor daily,first, don't skip any.  Impression: 1. Well female exam with routine gynecological exam       Plan: Physical in 1 year Refilled Effexor-XR 150  mg take 1 daily with 11 refills Mammogram yearly Labs with PCP Colonoscopy per GI

## 2016-04-04 ENCOUNTER — Encounter (HOSPITAL_COMMUNITY): Payer: Self-pay | Admitting: Emergency Medicine

## 2016-04-04 ENCOUNTER — Emergency Department (HOSPITAL_COMMUNITY)
Admission: EM | Admit: 2016-04-04 | Discharge: 2016-04-04 | Disposition: A | Discharge status: Home / Self Care | Payer: BLUE CROSS/BLUE SHIELD | Attending: Emergency Medicine | Admitting: Emergency Medicine

## 2016-04-04 DIAGNOSIS — Z79899 Other long term (current) drug therapy: Secondary | ICD-10-CM | POA: Diagnosis not present

## 2016-04-04 DIAGNOSIS — S46811D Strain of other muscles, fascia and tendons at shoulder and upper arm level, right arm, subsequent encounter: Secondary | ICD-10-CM | POA: Insufficient documentation

## 2016-04-04 DIAGNOSIS — X58XXXD Exposure to other specified factors, subsequent encounter: Secondary | ICD-10-CM | POA: Diagnosis not present

## 2016-04-04 DIAGNOSIS — S161XXA Strain of muscle, fascia and tendon at neck level, initial encounter: Secondary | ICD-10-CM | POA: Diagnosis not present

## 2016-04-04 DIAGNOSIS — I10 Essential (primary) hypertension: Secondary | ICD-10-CM | POA: Diagnosis not present

## 2016-04-04 DIAGNOSIS — X501XXA Overexertion from prolonged static or awkward postures, initial encounter: Secondary | ICD-10-CM | POA: Diagnosis not present

## 2016-04-04 DIAGNOSIS — S199XXD Unspecified injury of neck, subsequent encounter: Secondary | ICD-10-CM | POA: Diagnosis present

## 2016-04-04 MED ORDER — NAPROXEN 500 MG PO TABS: ORAL_TABLET | ORAL | 0 refills | Status: DC

## 2016-04-04 MED ORDER — CYCLOBENZAPRINE HCL 5 MG PO TABS: 5.0000 mg | ORAL_TABLET | Freq: Three times a day (TID) | ORAL | 0 refills | Status: DC | PRN

## 2016-04-04 MED ORDER — KETOROLAC TROMETHAMINE 60 MG/2ML IM SOLN
60.0000 mg | Freq: Once | INTRAMUSCULAR | Status: AC
  Administered 2016-04-04: 60 mg via INTRAMUSCULAR
  Filled 2016-04-04: qty 2

## 2016-04-04 NOTE — ED Triage Notes (Signed)
Pt c/o neck pain x 3 weeks and has seen pcp for the same.

## 2016-04-04 NOTE — ED Provider Notes (Signed)
[facility] DEPT Provider Note   CSN: [id number] Arrival date & time: 04/04/16  0057  Time seen 01:50 AM   History   Chief Complaint Chief Complaint  Patient presents with  . Neck Pain    HPI Shelby Guerra is a 60 y.o. female.  HPI patient initially said she been having pain in her right neck for a couple weeks however on further talking she's had the pain for several months. She complains of pain in the right side of her neck. She states at work she looks down and that movement of holding her head down makes the pain worse. Also laying on her right side to sleep makes it worse. She states laying flat and Biofreeze makes it feel better. She denies any known injury. She states she has been doing the same job for the last 2 years. She denies any numbness or tingling of her extremities. Patient is right-handed. She states the pain seems to be getting worse last 2-3 days. She states she was initially seen by her PCP twice to put her on some muscle relaxers without improvement. She has been seen by Olegario Shearer, Dr. Lorin Mercy at least twice and was initially placed on prednisone and then diclofenac. She states he did x-rays and told her she had something like bone spurs in her neck. She has an appointment to see him again in the next few weeks.  Past Medical History:  Diagnosis Date  . GERD (gastroesophageal reflux disease)   . High cholesterol   . Hot flashes 03/10/2013  . Hypertension   . Impaired fasting glucose   . Kidney stone    oxalate/uric acid  . Moody 03/14/2014    Patient Active Problem List   Diagnosis Date Noted  . Depression 02/01/2015  . Moody 03/14/2014  . Hot flashes 03/10/2013  . GERD 11/29/2009  . CONSTIPATION, CHRONIC 11/29/2009  . NAUSEA, CHRONIC 11/29/2009    Past Surgical History:  Procedure Laterality Date  . ABDOMINAL HYSTERECTOMY    . COLONOSCOPY    12/13/2008   Dr. Gala Romney: Normal rectum and colon  . INGUINAL HERNIA REPAIR    . TUBAL LIGATION      OB  History    Gravida Para Term Preterm AB Living   1 1       1    SAB TAB Ectopic Multiple Live Births           1       Home Medications    Prior to Admission medications   Medication Sig Start Date End Date Taking? Authorizing Provider  diclofenac (VOLTAREN) 75 MG EC tablet Take 75 mg by mouth 2 (two) times daily.   Yes Historical Provider, MD  cyclobenzaprine (FLEXERIL) 5 MG tablet Take 1 tablet (5 mg total) by mouth 3 (three) times daily as needed. 04/04/16   Rolland Porter, MD  naproxen (NAPROSYN) 500 MG tablet Take 1 po BID with food prn pain 04/04/16   Rolland Porter, MD  pantoprazole (PROTONIX) 40 MG tablet Take 40 mg by mouth daily.  08/17/15   Historical Provider, MD  ranitidine (ZANTAC) 150 MG tablet Take 1 tablet (150 mg total) by mouth 2 (two) times daily. 03/05/16 03/15/16  Duffy Bruce, MD  sucralfate (CARAFATE) 1 GM/10ML suspension Take 10 mLs (1 g total) by mouth 3 (three) times daily as needed (heartburn). Patient not taking: Reported on 04/01/2016 03/05/16   Duffy Bruce, MD  venlafaxine XR (EFFEXOR-XR) 150 MG 24 hr capsule Take 1 capsule (150 mg total)  by mouth daily with breakfast. 04/01/16   Estill Dooms, NP    Family History Family History  Problem Relation Age of Onset  . Hypertension Brother   . Kidney failure Brother     had kidney transplant  . Hyperlipidemia Brother   . Hypertension Sister   . Hyperlipidemia Sister   . Cancer Sister     cervical  . Hypertension Brother   . Kidney failure Father   . Cancer Mother   . Colon cancer Neg Hx     Social History Social History  Substance Use Topics  . Smoking status: Never Smoker  . Smokeless tobacco: Never Used  . Alcohol use No  employed   Allergies   Codeine and Propoxyphene n-acetaminophen   Review of Systems Review of Systems  All other systems reviewed and are negative.    Physical Exam Updated Vital Signs BP 158/86   Pulse (!) 51   Temp 97.9 F (36.6 C)   Resp 18   Ht 5' (1.524 m)    Wt 173 lb (78.5 kg)   SpO2 100%   BMI 33.79 kg/m   Vital signs normal except for bradycardia   Physical Exam  Constitutional: She is oriented to person, place, and time. She appears well-developed and well-nourished.  Non-toxic appearance. She does not appear ill. No distress.  HENT:  Head: Normocephalic and atraumatic.  Right Ear: External ear normal.  Left Ear: External ear normal.  Nose: Nose normal. No mucosal edema or rhinorrhea.  Mouth/Throat: Mucous membranes are normal. No dental abscesses or uvula swelling.  Eyes: Conjunctivae and EOM are normal. Pupils are equal, round, and reactive to light.  Neck: Normal range of motion and full passive range of motion without pain. Neck supple.    Patient source of pain is her trapezius as shown on the drawing. She does not have pain in the midline cervical spine, the paraspinous muscles of the cervical spine or the SCM. She has no pain on range of motion of her neck except if she holds her chin down then that is uncomfortable in the same area.  Pulmonary/Chest: Effort normal and breath sounds normal. No respiratory distress. She has no rhonchi. She exhibits no crepitus.  Abdominal: Normal appearance.  Musculoskeletal: Normal range of motion. She exhibits no edema or tenderness.  Moves all extremities well.   Neurological: She is alert and oriented to person, place, and time. She has normal strength. No cranial nerve deficit.  Grips equal  Skin: Skin is warm, dry and intact. No rash noted. No erythema. No pallor.  Psychiatric: She has a normal mood and affect. Her speech is normal and behavior is normal. Her mood appears not anxious.  Nursing note and vitals reviewed.    ED Treatments / Results  Labs (all labs ordered are listed, but only abnormal results are displayed) Labs Reviewed - No data to display  EKG  EKG Interpretation None       Radiology No results found.  Procedures Procedures (including critical care  time)  Medications Ordered in ED Medications  ketorolac (TORADOL) injection 60 mg (60 mg Intramuscular Given 04/04/16 0226)     Initial Impression / Assessment and Plan / ED Course  I have reviewed the triage vital signs and the nursing notes.  Pertinent labs & imaging results that were available during my care of the patient were reviewed by me and considered in my medical decision making (see chart for details).  Clinical Course    Patient  has tenderness over her right trapezius that is exacerbation by the way she holds her head at work. She is already been seen by her PCP and had x-rays done by her orthopedist. She drove herself to the ED. She is given Toradol IM in the emergency department we discussed using ice and heat to the sore muscle. She was told to stop the diclofenac and she was placed on a acute acting nonsteroidal, and naproxen. She also placed on a low-dose Flexeril for muscle spasm. She is to keep her appointment with Dr. Lorin Mercy however she also is advised to consider seeing a chiropractor for her sore muscle.  Final Clinical Impressions(s) / ED Diagnoses   Final diagnoses:  Trapezius strain, right, subsequent encounter    New Prescriptions New Prescriptions   CYCLOBENZAPRINE (FLEXERIL) 5 MG TABLET    Take 1 tablet (5 mg total) by mouth 3 (three) times daily as needed.   NAPROXEN (NAPROSYN) 500 MG TABLET    Take 1 po BID with food prn pain    Plan discharge  Rolland Porter, MD, Barbette Or, MD 04/04/16 [phone]

## 2016-04-04 NOTE — Discharge Instructions (Signed)
Use ice and heat on the sore muscle. Stop the diclofenac and take the prescribed medications. Keep your appointment with Dr Lorin Mercy. Consider seeing a chiropractor to see if they can help with the muscle soreness.

## 2016-04-08 ENCOUNTER — Telehealth (HOSPITAL_COMMUNITY): Payer: Self-pay | Admitting: Family Medicine

## 2016-04-08 ENCOUNTER — Ambulatory Visit (HOSPITAL_COMMUNITY): Payer: BLUE CROSS/BLUE SHIELD | Admitting: Physical Therapy

## 2016-04-08 NOTE — Telephone Encounter (Signed)
04/08/16 pt left a message that she had to change her appt because she is at work

## 2016-04-11 ENCOUNTER — Ambulatory Visit (INDEPENDENT_AMBULATORY_CARE_PROVIDER_SITE_OTHER): Payer: BLUE CROSS/BLUE SHIELD | Admitting: Orthopaedic Surgery

## 2016-04-11 ENCOUNTER — Encounter (INDEPENDENT_AMBULATORY_CARE_PROVIDER_SITE_OTHER): Payer: Self-pay | Admitting: Orthopaedic Surgery

## 2016-04-11 ENCOUNTER — Ambulatory Visit (HOSPITAL_COMMUNITY): Payer: BLUE CROSS/BLUE SHIELD | Attending: Orthopaedic Surgery | Admitting: Physical Therapy

## 2016-04-11 VITALS — BP 138/70 | HR 50 | Ht 61.0 in | Wt 179.0 lb

## 2016-04-11 DIAGNOSIS — M5412 Radiculopathy, cervical region: Secondary | ICD-10-CM | POA: Insufficient documentation

## 2016-04-11 DIAGNOSIS — M62838 Other muscle spasm: Secondary | ICD-10-CM | POA: Diagnosis not present

## 2016-04-11 DIAGNOSIS — M542 Cervicalgia: Secondary | ICD-10-CM

## 2016-04-11 NOTE — Progress Notes (Addendum)
Office Visit Note   Patient: Shelby Guerra           Date of Birth: October 05, 1955           MRN: [id number] Visit Date: 04/11/2016              Requested by: Mikey Kirschner, MD [address], [place] [address] PCP: Mickie Hillier, MD   Assessment & Plan: Visit Diagnoses:  1. Cervicalgia     Plan: She'll continue therapy she just started therapy is gotten some partial improvement with treatment. Pain primarily bothers her at night. 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 She is maintained disc space height at those 2 levels but does have some spurring. She'll continue therapy return in one month. If she develops any weakness or numbness she will call. She stopped the Voltaren switch to Naprosyn. She's had one emergency room visit related to this. She got some initial improvement with the prednisone pack. She required intramuscular injection for pain while in the ER She'll continue her therapy return 1 month recheck. Follow-Up Instructions: No Follow-up on file.   Orders:  No orders of the defined types were placed in this encounter.  No orders of the defined types were placed in this encounter.     Procedures: No procedures performed   Clinical Data: No additional findings.   Subjective: Chief Complaint  Patient presents with  . Right Shoulder - Follow-up  . Neck - Follow-up    Patient is here for follow up of right neck and shoulder pain. She denies any radiculopathy. She does have a history of spurring and narrowing at C5-6, C6-7.  She is attending physical therapy at [facility] and that is going well. She did have to go to [facility] ER last week because of the pain and they changed her Voltaren 75 mg to Naproxen.    Review of Systems  Constitutional: Negative for chills and diaphoresis.  HENT: Negative for ear discharge, ear pain and nosebleeds.   Eyes: Negative for discharge and visual disturbance.    Respiratory: Negative for cough, choking and shortness of breath.   Cardiovascular: Negative for chest pain and palpitations.  Gastrointestinal: Positive for constipation. Negative for abdominal distention and abdominal pain.  Endocrine: Negative for cold intolerance and heat intolerance.  Genitourinary: Negative for flank pain and hematuria.       Positive for past history of kidney stones  Skin: Negative for rash and wound.  Neurological: Negative for seizures and speech difficulty.  Hematological: Negative for adenopathy. Does not bruise/bleed easily.  Psychiatric/Behavioral: Negative for agitation and suicidal ideas.       Positive for depression     Objective: Vital Signs: BP 138/70   Pulse (!) 50   Ht 5\' 1"  (1.549 m)   Wt 179 lb (81.2 kg)   BMI 33.82 kg/m   Physical Exam  Constitutional: She is oriented to person, place, and time. She appears well-developed.  HENT:  Head: Normocephalic.  Right Ear: External ear normal.  Left Ear: External ear normal.  Eyes: Pupils are equal, round, and reactive to light.  Neck: No tracheal deviation present. No thyromegaly present.  Cardiovascular: Normal rate.   Pulmonary/Chest: Effort normal.  Abdominal: Soft.  Neurological: She is alert and oriented to person, place, and time.  Skin: Skin is warm and dry.  Psychiatric: She has a normal mood and affect. Her behavior is normal.    Ortho  Exam patients of brachial plexus tenderness. No pain with extension flexion 4 flexion chin to chest. Normal gait no supraclavicular lymphadenopathy. No numbness or tingling in her fingers. Reflexes are 2+ and symmetrical no isolated motor weakness of the upper extremities.  Specialty Comments:  No specialty comments available.  Imaging: No results found.   PMFS History: Patient Active Problem List   Diagnosis Date Noted  . Depression 02/01/2015  . Moody 03/14/2014  . Hot flashes 03/10/2013  . GERD 11/29/2009  . CONSTIPATION, CHRONIC  11/29/2009  . NAUSEA, CHRONIC 11/29/2009   Past Medical History:  Diagnosis Date  . GERD (gastroesophageal reflux disease)   . High cholesterol   . Hot flashes 03/10/2013  . Hypertension   . Impaired fasting glucose   . Kidney stone    oxalate/uric acid  . Moody 03/14/2014    Family History  Problem Relation Age of Onset  . Hypertension Brother   . Kidney failure Brother     had kidney transplant  . Hyperlipidemia Brother   . Hypertension Sister   . Hyperlipidemia Sister   . Cancer Sister     cervical  . Hypertension Brother   . Kidney failure Father   . Cancer Mother   . Colon cancer Neg Hx     Past Surgical History:  Procedure Laterality Date  . ABDOMINAL HYSTERECTOMY    . COLONOSCOPY    12/13/2008   Dr. Gala Romney: Normal rectum and colon  . INGUINAL HERNIA REPAIR    . TUBAL LIGATION     Social History   Occupational History  . Not on file.   Social History Main Topics  . Smoking status: Never Smoker  . Smokeless tobacco: Never Used  . Alcohol use No  . Drug use: No  . Sexual activity: No     Comment: hyst

## 2016-04-11 NOTE — Therapy (Signed)
[facility] [place], [place], [address] Phone: [phone]   Fax:  [phone]  Physical Therapy Evaluation  Patient Details  Name: Shelby Guerra MRN: [id number] Date of Birth: 05-Nov-1955 Referring Provider: Rodell Perna  Encounter Date: 04/11/2016      PT End of Session - 04/11/16 0852    Visit Number 1   Number of Visits 12   Date for PT Re-Evaluation 05/11/16   Authorization Type [employer]   PT Start Time 0820   PT Stop Time 0852   PT Time Calculation (min) 32 min   Activity Tolerance Patient tolerated treatment well   Behavior During Therapy [facility] for tasks assessed/performed      Past Medical History:  Diagnosis Date  . GERD (gastroesophageal reflux disease)   . High cholesterol   . Hot flashes 03/10/2013  . Hypertension   . Impaired fasting glucose   . Kidney stone    oxalate/uric acid  . Moody 03/14/2014    Past Surgical History:  Procedure Laterality Date  . ABDOMINAL HYSTERECTOMY    . COLONOSCOPY    12/13/2008   Dr. Gala Romney: Normal rectum and colon  . INGUINAL HERNIA REPAIR    . TUBAL LIGATION      There were no vitals filed for this visit.       Subjective Assessment - 04/11/16 0900    Subjective Shelby Guerra states that she has been having cervical pain for several months.  The pain is progressive in nature and was bad enough on 04/04/2016 that she went to the ER.  The pain is mainly on her right side and is aggrevated by bending her head down.  X-ray show bone spurs.    Currently in Pain? Yes   Pain Score 2   will get as high as an 8/10    Pain Location Neck   Pain Orientation Right   Pain Descriptors / Indicators Aching;Throbbing;Tightness   Pain Type Chronic pain   Pain Radiating Towards Rt shoulder    Pain Onset More than a month ago   Aggravating Factors  looking down    Pain Relieving Factors rest    Effect of Pain on Daily Activities unsure            [facility] PT Assessment - 04/11/16  0001      Assessment   Medical Diagnosis Radicular cervical pain   Referring Provider Rodell Perna   Onset Date/Surgical Date 04/04/16  exacerbation   Next MD Visit 04/11/2016   Prior Therapy none     Precautions   Precautions None     Restrictions   Weight Bearing Restrictions No     Balance Screen   Has the patient fallen in the past 6 months No   Has the patient had a decrease in activity level because of a fear of falling?  No   Is the patient reluctant to leave their home because of a fear of falling?  No     Prior Function   Level of Independence Independent     Cognition   Overall Cognitive Status Within Functional Limits for tasks assessed     Observation/Other Assessments   Focus on Therapeutic Outcomes (FOTO)  56     Posture/Postural Control   Posture/Postural Control Postural limitations   Postural Limitations Rounded Shoulders;Forward head     ROM / Strength   AROM / PROM / Strength AROM;Strength     AROM   AROM Assessment Site Cervical  Cervical Flexion 60   Cervical Extension 55   Cervical - Right Side Bend 38  increases pain    Cervical - Left Side Bend 42   Cervical - Right Rotation 72   Cervical - Left Rotation 65     Strength   Strength Assessment Site Hand;Cervical   Right/Left hand Right;Left   Right Hand Grip (lbs) 64   Left Hand Grip (lbs) 75   Cervical Extension 5/5   Cervical - Right Side Bend 5/5   Cervical - Left Side Bend 5/5                   [facility] Adult PT Treatment/Exercise - 04/11/16 0001      Exercises   Exercises Neck     Neck Exercises: Supine   Neck Retraction 10 reps   Other Supine Exercise scapular retraction x10                PT Education - 04/11/16 0852    Education provided Yes   Education Details HEP   Person(s) Educated Patient   Methods Explanation;Handout;Tactile cues;Verbal cues   Comprehension Verbalized understanding;Returned demonstration          PT Short Term Goals -  04/11/16 1131      PT SHORT TERM GOAL #1   Title Pt to verbalize the importance of posture and body mechanics in cervical care.    Time 1   Period Weeks   Status New     PT SHORT TERM GOAL #2   Title Pt to be completing a home exercise program to assist in decreasing cervical pain to no greater than a 4/10 to allow pt to work with greater comfort.    Time 3   Period Weeks   Status New     PT SHORT TERM GOAL #3   Title Pt to be able to reach up to take items off of an upper shelf without having any cervical or right shoulder discomfort.    Time 3   Period Weeks           PT Long Term Goals - 04/11/16 1135      PT LONG TERM GOAL #1   Title Pt pain to be no greater than a 2/10 to allow pt to sleep throughout the night   Time 6   Period Weeks   Status New     PT LONG TERM GOAL #2   Title Pt spasms in right trapezius musculature to be reduced to mild to allow pt to complete a full day at work without increased pain.   Time 6   Period Weeks   Status New     PT LONG TERM GOAL #3   Title Pt to verbalize that she has not had any radicular pain into her right shoulder for the past two weeks to demonstrate decreased nerve irritation.    Time 6   Period Weeks   Status New               Plan - 04/11/16 0853    Clinical Impression Statement Shelby Guerra is a 60 yo female who has been having cervical pain for several months.  The pain is now going into her Rt shoulder area and she is noting increased pain therefore she has been referred to skilled physical therapy.  Evaluation demonstrates marked mm spasm in bilateral trapezius musculature right greater than left, increased pain, decreased hand grip and decreased activty tolerance.  Shelby Guerra will  benefit from skilled physical therapy to address these issues and maximize her comfort and functional activity.     Rehab Potential Good   PT Frequency 2x / week   PT Duration 6 weeks   PT Treatment/Interventions ADLs/Self Care  Home Management;Cryotherapy;Moist Heat;Ultrasound;Traction;Therapeutic exercise;Therapeutic activities;Patient/family education;Manual techniques   PT Next Visit Plan Begin pectorial stretches, w-back, neck flexibility stretch, as well as manual.  Continue with postural training and cervical stability exercises.    Consulted and Agree with Plan of Care Patient      Patient will benefit from skilled therapeutic intervention in order to improve the following deficits and impairments:  Decreased activity tolerance, Decreased strength, Increased fascial restricitons, Increased muscle spasms, Postural dysfunction, Pain  Visit Diagnosis: Radiculopathy, cervical region - Plan: PT plan of care cert/re-cert  Other muscle spasm - Plan: PT plan of care cert/re-cert     Problem List Patient Active Problem List   Diagnosis Date Noted  . Depression 02/01/2015  . Moody 03/14/2014  . Hot flashes 03/10/2013  . GERD 11/29/2009  . CONSTIPATION, CHRONIC 11/29/2009  . NAUSEA, CHRONIC 11/29/2009   Rayetta Humphrey, PT CLT [phone] 04/11/2016, 11:44 AM  [facility] [address] [place], [place], [address] Phone: [phone]   Fax:  [phone]  Name: Shelby Guerra MRN: [id number] Date of Birth: Oct 29, 1955

## 2016-04-11 NOTE — Patient Instructions (Addendum)
Flexibility: Neck Retraction    Pull head straight back, keeping eyes and jaw level. Repeat _10___ times per set. Do __1__ sets per session. Do _2___ sessions per day.  http://orth.exer.us/344   Copyright  VHI. All rights reserved.  Scapular Retraction (Standing)    With arms at sides, pinch shoulder blades together. Repeat __10__ times per set. Do __2__ sets per session. Do __3__ sessions per day.  http://orth.exer.us/944   Copyright  VHI. All rights reserved.

## 2016-04-26 ENCOUNTER — Ambulatory Visit (INDEPENDENT_AMBULATORY_CARE_PROVIDER_SITE_OTHER): Payer: BLUE CROSS/BLUE SHIELD | Admitting: Family Medicine

## 2016-04-26 ENCOUNTER — Encounter: Payer: Self-pay | Admitting: Family Medicine

## 2016-04-26 VITALS — BP 132/86 | Temp 98.1°F | Ht 61.0 in | Wt 176.4 lb

## 2016-04-26 DIAGNOSIS — R300 Dysuria: Secondary | ICD-10-CM | POA: Diagnosis not present

## 2016-04-26 LAB — POCT URINALYSIS DIPSTICK
PH UA: 6
SPEC GRAV UA: 1.02

## 2016-04-26 MED ORDER — CIPROFLOXACIN HCL 250 MG PO TABS: 250.0000 mg | ORAL_TABLET | Freq: Two times a day (BID) | ORAL | 0 refills | Status: DC

## 2016-04-26 MED ORDER — FLUCONAZOLE 150 MG PO TABS: ORAL_TABLET | ORAL | 0 refills | Status: DC

## 2016-04-26 NOTE — Progress Notes (Signed)
   Subjective:    Patient ID: Shelby Guerra, female    DOB: 22-Oct-1955, 60 y.o.   MRN: [id number]  Dysuria   This is a new problem. The current episode started today. Associated symptoms comments: Abdominal pain. She has tried nothing for the symptoms.   No vomiting  Burning and incr freq  Twelve hrs sinc enon    Review of Systems  Genitourinary: Positive for dysuria.       Objective:   Physical Exam Alert vital stable hydration good lungs clear heart rare rhythm no CVA tenderness. Abdomen positive suprapubic tenderness  Urinalysis 4-6 white blood cells per high-power field       Assessment & Plan:  Impression urinary checked infection plan antibiotics prescribed. Symptom care discussed warning signs discussed WSL

## 2016-05-16 ENCOUNTER — Ambulatory Visit (INDEPENDENT_AMBULATORY_CARE_PROVIDER_SITE_OTHER): Payer: BLUE CROSS/BLUE SHIELD | Admitting: Orthopaedic Surgery

## 2016-05-21 ENCOUNTER — Other Ambulatory Visit: Payer: Self-pay | Admitting: Family Medicine

## 2016-05-30 ENCOUNTER — Telehealth: Payer: Self-pay | Admitting: Family Medicine

## 2016-05-30 MED ORDER — NITROFURANTOIN MONOHYD MACRO 100 MG PO CAPS: 100.0000 mg | ORAL_CAPSULE | Freq: Two times a day (BID) | ORAL | 0 refills | Status: DC

## 2016-05-30 NOTE — Telephone Encounter (Signed)
Left message to return call (get symptoms)

## 2016-05-30 NOTE — Telephone Encounter (Signed)
Patient states she still having urinary frequency, no pain no fever-missed 2 pills

## 2016-05-30 NOTE — Telephone Encounter (Signed)
macrobid 100 bid 7 d 

## 2016-05-30 NOTE — Telephone Encounter (Signed)
Pt was seen for a bladder inf on 11/17 and it is still not better. Pt states that she lost two of the pills. Can more be called in or does she need to be rechecked. Please advise.     [place]

## 2016-05-30 NOTE — Telephone Encounter (Signed)
Prescription sent electronically to pharmacy. Patient notified. 

## 2016-06-07 ENCOUNTER — Other Ambulatory Visit: Payer: Self-pay | Admitting: Family Medicine

## 2016-07-15 ENCOUNTER — Telehealth (INDEPENDENT_AMBULATORY_CARE_PROVIDER_SITE_OTHER): Payer: Self-pay | Admitting: Orthopaedic Surgery

## 2016-07-15 DIAGNOSIS — M542 Cervicalgia: Secondary | ICD-10-CM

## 2016-07-15 NOTE — Telephone Encounter (Signed)
I CALLED. SHE WANTS TO PROCEED WITH CERVICAL MRI. SHE WILL STAY ON NAPROXEN. [place]

## 2016-07-15 NOTE — Telephone Encounter (Signed)
I left voicemail for patient and advised she will need to call and schedule ROV for MRI review.

## 2016-07-15 NOTE — Telephone Encounter (Signed)
Order entered

## 2016-07-15 NOTE — Telephone Encounter (Signed)
Please advise 

## 2016-07-15 NOTE — Telephone Encounter (Signed)
Pt asked if we can give her something for her pain she is still having or if she has to be seen again.  [phone]

## 2016-07-31 ENCOUNTER — Ambulatory Visit
Admission: RE | Admit: 2016-07-31 | Discharge: 2016-07-31 | Disposition: A | Discharge status: Home / Self Care | Payer: BLUE CROSS/BLUE SHIELD | Source: Ambulatory Visit | Attending: Orthopaedic Surgery | Admitting: Orthopaedic Surgery

## 2016-07-31 DIAGNOSIS — M50221 Other cervical disc displacement at C4-C5 level: Secondary | ICD-10-CM | POA: Diagnosis not present

## 2016-07-31 DIAGNOSIS — M542 Cervicalgia: Secondary | ICD-10-CM

## 2016-07-31 DIAGNOSIS — M50222 Other cervical disc displacement at C5-C6 level: Secondary | ICD-10-CM | POA: Diagnosis not present

## 2016-08-08 ENCOUNTER — Ambulatory Visit (INDEPENDENT_AMBULATORY_CARE_PROVIDER_SITE_OTHER): Payer: BLUE CROSS/BLUE SHIELD | Admitting: Orthopaedic Surgery

## 2016-08-08 ENCOUNTER — Encounter (INDEPENDENT_AMBULATORY_CARE_PROVIDER_SITE_OTHER): Payer: Self-pay | Admitting: Orthopaedic Surgery

## 2016-08-08 VITALS — BP 128/68 | HR 65 | Ht 61.0 in | Wt 179.0 lb

## 2016-08-08 DIAGNOSIS — M542 Cervicalgia: Secondary | ICD-10-CM

## 2016-08-08 NOTE — Progress Notes (Signed)
Office Visit Note   Patient: Shelby Guerra           Date of Birth: 10-23-1955           MRN: [id number] Visit Date: 08/08/2016              Requested by: Mikey Kirschner, MD [facility] [place], [place] [address] PCP: Mickie Hillier, MD   Assessment & Plan: Visit Diagnoses:  1. Neck pain     Plan: MI scan shows some mild bulging at C2-3, C3-4 and C4-5. Anterior spurring at C5-6. She has no areas of significant foraminal or central compression. Conservative treatment discussed including taking Aleve as she's been doing. She can use some topical cream, heating pad intermittently ice etc. She will return if she develops radicular symptoms.  Follow-Up Instructions: No Follow-up on file.   Orders:  No orders of the defined types were placed in this encounter.  No orders of the defined types were placed in this encounter.     Procedures: No procedures performed   Clinical Data: No additional findings.   Subjective: Chief Complaint  Patient presents with  . Neck - Pain, Follow-up    Patient returns to review MRI Cervical Spine.     Review of Systems  Constitutional: Negative for chills and diaphoresis.  HENT: Negative for ear discharge, ear pain and nosebleeds.   Eyes: Negative for discharge and visual disturbance.  Respiratory: Negative for cough, choking and shortness of breath.   Cardiovascular: Negative for chest pain and palpitations.  Gastrointestinal: Negative for abdominal distention and abdominal pain.  Endocrine: Negative for cold intolerance and heat intolerance.  Genitourinary: Negative for flank pain and hematuria.  Musculoskeletal: Positive for neck pain.  Skin: Negative for rash and wound.  Neurological: Negative for seizures and speech difficulty.  Hematological: Negative for adenopathy. Does not bruise/bleed easily.  Psychiatric/Behavioral: Negative for agitation and suicidal ideas.     Objective: Vital Signs: BP 128/68   Pulse  65   Ht 5\' 1"  (1.549 m)   Wt 179 lb (81.2 kg)   BMI 33.82 kg/m   Physical Exam  Constitutional: She is oriented to person, place, and time. She appears well-developed.  HENT:  Head: Normocephalic.  Right Ear: External ear normal.  Left Ear: External ear normal.  Eyes: Pupils are equal, round, and reactive to light.  Neck: No tracheal deviation present. No thyromegaly present.  Cardiovascular: Normal rate.   Pulmonary/Chest: Effort normal.  Abdominal: Soft.  Musculoskeletal:  She is continued mild brachial plexus tenderness. Discomforts full flexion some discomfort with extension. Reflexes isolated motor testing is intact no lower extremity hyperreflexia no rash on exposed skin no supraclavicular lymphadenopathy. Normal heel toe gait.  Neurological: She is alert and oriented to person, place, and time.  Skin: Skin is warm and dry.  Psychiatric: She has a normal mood and affect. Her behavior is normal.    Ortho Exam  Specialty Comments:  No specialty comments available.  Imaging: Show images for MR Cervical Spine w/o contrast  Study Result   CLINICAL DATA:  Right-sided neck pain for 2 months, progressively worsening. Right arm pain. C5-6 and C6-7 spondylosis.  EXAM: MRI CERVICAL SPINE WITHOUT CONTRAST  TECHNIQUE: Multiplanar, multisequence MR imaging of the cervical spine was performed. No intravenous contrast was administered.  COMPARISON:  None.  FINDINGS: Alignment: Mild cervical spine straightening. No significant listhesis.  Vertebrae: No evidence of fracture, osseous lesion, or significant marrow edema.  Cord: Normal signal and  morphology.  Posterior Fossa, vertebral arteries, paraspinal tissues: 5 mm left thyroid nodule, possibly calcified and below the size threshold for routine follow-up ultrasound.  Disc levels:  C2-3: Small left central disc protrusion results in a minimal impression on the ventral spinal cord without significant spinal  or neural foraminal stenosis.  C3-4: Small central disc protrusion without spinal stenosis. Minimal right greater than left neural foraminal narrowing due to uncovertebral spurring.  C4-5: Small central disc protrusion results in a mild focal impression on the ventral spinal cord without significant spinal stenosis. No neural foraminal stenosis.  C5-6:  At most minimal disc bulging without stenosis.  C6-7:  Negative.  C7-T1:  Negative.  IMPRESSION: Mild cervical spondylosis including small central disc protrusions at C2-3, C3-4, and C4-5. No spinal stenosis. Minimal neural foraminal narrowing at C3-4.   Electronically Signed   By: Logan Bores M.D.   On: 07/31/2016 08:49       PMFS History: Patient Active Problem List   Diagnosis Date Noted  . Depression 02/01/2015  . Moody 03/14/2014  . Hot flashes 03/10/2013  . GERD 11/29/2009  . CONSTIPATION, CHRONIC 11/29/2009  . NAUSEA, CHRONIC 11/29/2009   Past Medical History:  Diagnosis Date  . GERD (gastroesophageal reflux disease)   . High cholesterol   . Hot flashes 03/10/2013  . Hypertension   . Impaired fasting glucose   . Kidney stone    oxalate/uric acid  . Moody 03/14/2014    Family History  Problem Relation Age of Onset  . Hypertension Brother   . Kidney failure Brother     had kidney transplant  . Hyperlipidemia Brother   . Hypertension Sister   . Hyperlipidemia Sister   . Cancer Sister     cervical  . Hypertension Brother   . Kidney failure Father   . Cancer Mother   . Colon cancer Neg Hx     Past Surgical History:  Procedure Laterality Date  . ABDOMINAL HYSTERECTOMY    . COLONOSCOPY    12/13/2008   Dr. Gala Romney: Normal rectum and colon  . INGUINAL HERNIA REPAIR    . TUBAL LIGATION     Social History   Occupational History  . Not on file.   Social History Main Topics  . Smoking status: Never Smoker  . Smokeless tobacco: Never Used  . Alcohol use No  . Drug use: No  . Sexual  activity: No     Comment: hyst

## 2016-08-21 ENCOUNTER — Encounter: Payer: Self-pay | Admitting: Family Medicine

## 2016-08-21 ENCOUNTER — Ambulatory Visit (INDEPENDENT_AMBULATORY_CARE_PROVIDER_SITE_OTHER): Payer: BLUE CROSS/BLUE SHIELD | Admitting: Family Medicine

## 2016-08-21 VITALS — BP 120/72 | Temp 98.4°F | Ht 61.0 in | Wt 181.0 lb

## 2016-08-21 DIAGNOSIS — S8011XA Contusion of right lower leg, initial encounter: Secondary | ICD-10-CM

## 2016-08-21 MED ORDER — HYDROCODONE-ACETAMINOPHEN 5-325 MG PO TABS: 1.0000 | ORAL_TABLET | Freq: Four times a day (QID) | ORAL | 0 refills | Status: DC | PRN

## 2016-08-21 NOTE — Progress Notes (Signed)
   Subjective:    Patient ID: Shelby Guerra, female    DOB: 04-Feb-1956, 61 y.o.   MRN: [id number]  Fall  The accident occurred 2 days ago. Fall occurred: on porch. There was no blood loss. The pain is moderate. The symptoms are aggravated by movement. Pertinent negatives include no headaches. She has tried NSAID for the symptoms. The treatment provided no relief.   she was walking out of her door slipped on her porch fell backwards landd on some steps did not have loss of consciousness did have arm pain and leg painnow walking with a slight limp it did happen 2 days ago she never had this problem before    Review of Systems  Neurological: Negative for headaches.   she relates pain and discomfort in the left arm  she also relates soreness in discomfort in the right lower leg    Objective:   Physical Exam  on physical exam there is a bruise in the left bicep region there is also tenderness alng the left dltoid reion in addition to this she also has a bruise on the right lower leg there is no sign of any blood clot. No respiratory distress. Patient denies loss of consciousness.       Assessment & Plan:   significant contusions no sign of blood clotwe will x-ray the right lower leg she states she will get the x-ray completed on Thursday wewill call her with the reslts no need for any other intervention currently follow-up if problems

## 2016-08-21 NOTE — Progress Notes (Deleted)
   Subjective:    Patient ID: Shelby Guerra, female    DOB: 11/28/1955, 61 y.o.   MRN: [id number]  HPI    Review of Systems     Objective:   Physical Exam        Assessment & Plan:

## 2016-08-22 ENCOUNTER — Ambulatory Visit (HOSPITAL_COMMUNITY)
Admission: RE | Admit: 2016-08-22 | Discharge: 2016-08-22 | Disposition: A | Discharge status: Home / Self Care | Payer: BLUE CROSS/BLUE SHIELD | Source: Ambulatory Visit | Attending: Family Medicine | Admitting: Family Medicine

## 2016-08-22 DIAGNOSIS — X58XXXA Exposure to other specified factors, initial encounter: Secondary | ICD-10-CM | POA: Diagnosis not present

## 2016-08-22 DIAGNOSIS — S8011XA Contusion of right lower leg, initial encounter: Secondary | ICD-10-CM | POA: Diagnosis not present

## 2016-08-22 DIAGNOSIS — M79661 Pain in right lower leg: Secondary | ICD-10-CM | POA: Diagnosis not present

## 2016-08-26 ENCOUNTER — Ambulatory Visit (HOSPITAL_COMMUNITY): Payer: BLUE CROSS/BLUE SHIELD | Admitting: Physical Therapy

## 2016-08-26 ENCOUNTER — Telehealth (HOSPITAL_COMMUNITY): Payer: Self-pay | Admitting: Family Medicine

## 2016-08-26 NOTE — Telephone Encounter (Signed)
08/26/16 pt cx and said that she just got off work.  She does want to reschedule

## 2016-10-17 ENCOUNTER — Encounter: Payer: Self-pay | Admitting: Family Medicine

## 2016-10-24 ENCOUNTER — Other Ambulatory Visit: Payer: Self-pay | Admitting: Family Medicine

## 2016-10-24 DIAGNOSIS — S46911D Strain of unspecified muscle, fascia and tendon at shoulder and upper arm level, right arm, subsequent encounter: Secondary | ICD-10-CM

## 2016-10-25 ENCOUNTER — Other Ambulatory Visit: Payer: Self-pay | Admitting: Adult Health

## 2016-10-25 DIAGNOSIS — Z1231 Encounter for screening mammogram for malignant neoplasm of breast: Secondary | ICD-10-CM

## 2016-11-21 ENCOUNTER — Telehealth (INDEPENDENT_AMBULATORY_CARE_PROVIDER_SITE_OTHER): Payer: Self-pay | Admitting: Orthopaedic Surgery

## 2016-11-21 NOTE — Telephone Encounter (Signed)
Patient called saying that the diclofenac that she was prescribed is not helping with her pain, she was wondering if she could get something else that would help. Thank you. CB # [phone]

## 2016-11-21 NOTE — Telephone Encounter (Signed)
Ucall. Set her up for physical therapy Cervical spondylosis. Thanks. Best to not take narcotics for chronic problem.

## 2016-11-21 NOTE — Telephone Encounter (Signed)
Please advise 

## 2016-11-22 NOTE — Telephone Encounter (Signed)
I called patient, no answer. Will attempt to call again.

## 2016-12-02 ENCOUNTER — Ambulatory Visit (HOSPITAL_COMMUNITY)
Admission: RE | Admit: 2016-12-02 | Discharge: 2016-12-02 | Disposition: A | Discharge status: Home / Self Care | Payer: BLUE CROSS/BLUE SHIELD | Source: Ambulatory Visit | Attending: Adult Health | Admitting: Adult Health

## 2016-12-02 DIAGNOSIS — Z1231 Encounter for screening mammogram for malignant neoplasm of breast: Secondary | ICD-10-CM | POA: Insufficient documentation

## 2016-12-16 ENCOUNTER — Telehealth: Payer: Self-pay | Admitting: Adult Health

## 2016-12-16 NOTE — Telephone Encounter (Signed)
Pt has UTI, to come in am at 9 to be seen

## 2016-12-17 ENCOUNTER — Ambulatory Visit (INDEPENDENT_AMBULATORY_CARE_PROVIDER_SITE_OTHER): Payer: BLUE CROSS/BLUE SHIELD | Admitting: Adult Health

## 2016-12-17 ENCOUNTER — Encounter: Payer: Self-pay | Admitting: Adult Health

## 2016-12-17 ENCOUNTER — Telehealth: Payer: Self-pay

## 2016-12-17 ENCOUNTER — Ambulatory Visit: Payer: BLUE CROSS/BLUE SHIELD | Admitting: Family Medicine

## 2016-12-17 ENCOUNTER — Other Ambulatory Visit: Payer: Self-pay

## 2016-12-17 VITALS — BP 110/72 | HR 65 | Ht 62.0 in | Wt 177.0 lb

## 2016-12-17 DIAGNOSIS — R350 Frequency of micturition: Secondary | ICD-10-CM

## 2016-12-17 DIAGNOSIS — R8290 Unspecified abnormal findings in urine: Secondary | ICD-10-CM | POA: Diagnosis not present

## 2016-12-17 LAB — POCT URINALYSIS DIPSTICK
Blood, UA: NEGATIVE
Glucose, UA: NEGATIVE
KETONES UA: NEGATIVE
LEUKOCYTES UA: NEGATIVE
Nitrite, UA: NEGATIVE
Protein, UA: NEGATIVE

## 2016-12-17 MED ORDER — NITROFURANTOIN MONOHYD MACRO 100 MG PO CAPS: 100.0000 mg | ORAL_CAPSULE | Freq: Two times a day (BID) | ORAL | 0 refills | Status: DC

## 2016-12-17 NOTE — Progress Notes (Signed)
Subjective:     Patient ID: Shelby Guerra, female   DOB: 05/28/1956, 61 y.o.   MRN: [id number]  HPI Shelby Guerra is a 61 year old black female in complaining of urinary frequency and odor in urine and some discomfort left side, no bowel changes.Has taken OTC med is a little better. She is sp hysterectomy.   Review of Systems Urinary frequency Odor to urine Some discomfort left side  No bowel changes  Reviewed past medical,surgical, social and family history. Reviewed medications and allergies.     Objective:   Physical Exam BP 110/72 (BP Location: Right Arm, Patient Position: Sitting, Cuff Size: Normal)   Pulse 65   Ht 5\' 2"  (1.575 m)   Wt 177 lb (80.3 kg)   BMI 32.37 kg/m urine dipstick negative, Skin warm and dry, No CVAT, abdomen is soft, no HSM, bladder non tender, but tender LLQ over bowel.     Assessment:     1. Urinary frequency   2. Abnormal urine odor       Plan:     Rx macrobid 1 bid x 7 days, #14 UA C&S sent Push fluids  Follow up prn, has physical in October

## 2016-12-17 NOTE — Telephone Encounter (Signed)
Chart rev. For new to est.

## 2016-12-18 LAB — MICROSCOPIC EXAMINATION: CASTS: NONE SEEN /LPF

## 2016-12-18 LAB — URINALYSIS, ROUTINE W REFLEX MICROSCOPIC
Bilirubin, UA: NEGATIVE
GLUCOSE, UA: NEGATIVE
Ketones, UA: NEGATIVE
NITRITE UA: NEGATIVE
PH UA: 5 (ref 5.0–7.5)
PROTEIN UA: NEGATIVE
RBC, UA: NEGATIVE
Specific Gravity, UA: 1.026 (ref 1.005–1.030)
UUROB: 0.2 mg/dL (ref 0.2–1.0)

## 2016-12-19 LAB — URINE CULTURE

## 2016-12-27 DIAGNOSIS — L816 Other disorders of diminished melanin formation: Secondary | ICD-10-CM | POA: Diagnosis not present

## 2016-12-27 DIAGNOSIS — L218 Other seborrheic dermatitis: Secondary | ICD-10-CM | POA: Diagnosis not present

## 2016-12-27 DIAGNOSIS — L918 Other hypertrophic disorders of the skin: Secondary | ICD-10-CM | POA: Diagnosis not present

## 2017-01-17 ENCOUNTER — Telehealth (INDEPENDENT_AMBULATORY_CARE_PROVIDER_SITE_OTHER): Payer: Self-pay | Admitting: Radiology

## 2017-01-17 ENCOUNTER — Telehealth (INDEPENDENT_AMBULATORY_CARE_PROVIDER_SITE_OTHER): Payer: Self-pay | Admitting: Orthopaedic Surgery

## 2017-01-17 MED ORDER — PREDNISONE 10 MG PO TABS: ORAL_TABLET | ORAL | 0 refills | Status: DC

## 2017-01-17 NOTE — Telephone Encounter (Signed)
You call please. Okay to refill prednisone pack. Reminded her that prednisone can increase the risks of infection. If she's not better and she needs to return to the office to be seen again thank you

## 2017-01-17 NOTE — Telephone Encounter (Signed)
Patient is requesting a refill of the prednisone 10 mg MDP, she is having a flare of neck pain.  Ok to refill? thanks

## 2017-01-17 NOTE — Telephone Encounter (Signed)
Rx submitted with instruction to f/u if no improvement after taking this med.

## 2017-01-17 NOTE — Addendum Note (Signed)
Addended by: Brand Males E on: 01/17/2017 02:47 PM   Modules accepted: Orders

## 2017-01-17 NOTE — Telephone Encounter (Signed)
Returned call to patient left message to call back [phone]

## 2017-02-06 DIAGNOSIS — M25775 Osteophyte, left foot: Secondary | ICD-10-CM | POA: Diagnosis not present

## 2017-02-06 DIAGNOSIS — M25774 Osteophyte, right foot: Secondary | ICD-10-CM | POA: Diagnosis not present

## 2017-02-06 DIAGNOSIS — M79672 Pain in left foot: Secondary | ICD-10-CM | POA: Diagnosis not present

## 2017-02-06 DIAGNOSIS — M79671 Pain in right foot: Secondary | ICD-10-CM | POA: Diagnosis not present

## 2017-02-19 DIAGNOSIS — M13162 Monoarthritis, not elsewhere classified, left knee: Secondary | ICD-10-CM | POA: Diagnosis not present

## 2017-02-19 DIAGNOSIS — M25561 Pain in right knee: Secondary | ICD-10-CM | POA: Diagnosis not present

## 2017-02-26 ENCOUNTER — Emergency Department (HOSPITAL_COMMUNITY)
Admission: EM | Admit: 2017-02-26 | Discharge: 2017-02-26 | Disposition: A | Discharge status: Home / Self Care | Payer: BLUE CROSS/BLUE SHIELD | Attending: Emergency Medicine | Admitting: Emergency Medicine

## 2017-02-26 ENCOUNTER — Emergency Department (HOSPITAL_COMMUNITY): Payer: BLUE CROSS/BLUE SHIELD

## 2017-02-26 ENCOUNTER — Encounter (HOSPITAL_COMMUNITY): Payer: Self-pay

## 2017-02-26 DIAGNOSIS — M7989 Other specified soft tissue disorders: Secondary | ICD-10-CM | POA: Diagnosis not present

## 2017-02-26 DIAGNOSIS — I10 Essential (primary) hypertension: Secondary | ICD-10-CM | POA: Insufficient documentation

## 2017-02-26 DIAGNOSIS — X58XXXA Exposure to other specified factors, initial encounter: Secondary | ICD-10-CM | POA: Diagnosis not present

## 2017-02-26 DIAGNOSIS — Y999 Unspecified external cause status: Secondary | ICD-10-CM | POA: Diagnosis not present

## 2017-02-26 DIAGNOSIS — Y939 Activity, unspecified: Secondary | ICD-10-CM | POA: Diagnosis not present

## 2017-02-26 DIAGNOSIS — M25562 Pain in left knee: Secondary | ICD-10-CM | POA: Diagnosis not present

## 2017-02-26 DIAGNOSIS — Z79899 Other long term (current) drug therapy: Secondary | ICD-10-CM | POA: Insufficient documentation

## 2017-02-26 DIAGNOSIS — Y929 Unspecified place or not applicable: Secondary | ICD-10-CM | POA: Diagnosis not present

## 2017-02-26 DIAGNOSIS — S86112A Strain of other muscle(s) and tendon(s) of posterior muscle group at lower leg level, left leg, initial encounter: Secondary | ICD-10-CM | POA: Diagnosis not present

## 2017-02-26 DIAGNOSIS — S86812A Strain of other muscle(s) and tendon(s) at lower leg level, left leg, initial encounter: Secondary | ICD-10-CM

## 2017-02-26 DIAGNOSIS — S8992XA Unspecified injury of left lower leg, initial encounter: Secondary | ICD-10-CM | POA: Diagnosis not present

## 2017-02-26 MED ORDER — KETOROLAC TROMETHAMINE 10 MG PO TABS: 10.0000 mg | ORAL_TABLET | Freq: Four times a day (QID) | ORAL | 0 refills | Status: DC | PRN

## 2017-02-26 MED ORDER — KETOROLAC TROMETHAMINE 30 MG/ML IJ SOLN
30.0000 mg | Freq: Once | INTRAMUSCULAR | Status: AC
Start: 2017-02-26 — End: 2017-02-26
  Administered 2017-02-26: 30 mg via INTRAMUSCULAR
  Filled 2017-02-26: qty 1

## 2017-02-26 NOTE — ED Provider Notes (Signed)
[facility] DEPT Provider Note   CSN: [id number] Arrival date & time: 02/26/17  3009     History   Chief Complaint Chief Complaint  Patient presents with  . Leg Pain    HPI Shelby Guerra is a 61 y.o. female.  Pt presents to the ED today with left leg pain and swelling.  The pt said that she has had sx for about 1 week.  The pt went to urgent care last week and was told it was inflammation.  She was told to take prednisone and tylenol.  Sx are not getting better.  Pt went back to work last night and was limping all night.  She denies redness, f/c.  No sob or cp.      Past Medical History:  Diagnosis Date  . GERD (gastroesophageal reflux disease)   . High cholesterol   . Hot flashes 03/10/2013  . Hypertension    pt denies  . Impaired fasting glucose   . Kidney stone    oxalate/uric acid  . Moody 03/14/2014    Patient Active Problem List   Diagnosis Date Noted  . Depression 02/01/2015  . Moody 03/14/2014  . Hot flashes 03/10/2013  . GERD 11/29/2009  . CONSTIPATION, CHRONIC 11/29/2009  . NAUSEA, CHRONIC 11/29/2009    Past Surgical History:  Procedure Laterality Date  . ABDOMINAL HYSTERECTOMY    . COLONOSCOPY    12/13/2008   Dr. Gala Romney: Normal rectum and colon  . INGUINAL HERNIA REPAIR    . TUBAL LIGATION      OB History    Gravida Para Term Preterm AB Living   1 1       1    SAB TAB Ectopic Multiple Live Births           1       Home Medications    Prior to Admission medications   Medication Sig Start Date End Date Taking? Authorizing Provider  diclofenac (VOLTAREN) 75 MG EC tablet Take 75 mg by mouth 2 (two) times daily.    [provider]  ketorolac (TORADOL) 10 MG tablet Take 1 tablet (10 mg total) by mouth every 6 (six) hours as needed. Do not take if you are taking Voltaren or any other NSAIDS. 02/26/17   Isla Pence, MD  nitrofurantoin, macrocrystal-monohydrate, (MACROBID) 100 MG capsule Take 1 capsule (100 mg total) by mouth 2  (two) times daily. 12/17/16   Estill Dooms, NP  predniSONE (DELTASONE) 10 MG tablet Take 4 tablets today, then 3 tabs x 3 days, then 2 tabs x 3 days, then 1 tab x 6 days.  Followup in office if no improvement. 01/17/17   Marybelle Killings, MD  ranitidine (ZANTAC) 150 MG tablet Take 1 tablet (150 mg total) by mouth 2 (two) times daily. 03/05/16 03/15/16  Duffy Bruce, MD  venlafaxine XR (EFFEXOR-XR) 150 MG 24 hr capsule Take 1 capsule (150 mg total) by mouth daily with breakfast. 04/01/16   Estill Dooms, NP    Family History Family History  Problem Relation Age of Onset  . Hypertension Brother   . Kidney failure Brother        had kidney transplant  . Hyperlipidemia Brother   . Hypertension Sister   . Hyperlipidemia Sister   . Cancer Sister        cervical  . Hypertension Brother   . Kidney failure Father   . Cancer Mother   . Colon cancer Neg Hx     Social  History Social History  Substance Use Topics  . Smoking status: Never Smoker  . Smokeless tobacco: Never Used  . Alcohol use No     Allergies   Codeine and Propoxyphene n-acetaminophen   Review of Systems Review of Systems  Musculoskeletal:       Left lower leg pain and swelling  All other systems reviewed and are negative.    Physical Exam Updated Vital Signs BP 122/80 (BP Location: Left Arm)   Pulse 61   Temp 97.7 F (36.5 C) (Oral)   Resp 20   Ht 5\' 2"  (1.575 m)   Wt 81.6 kg (180 lb)   SpO2 100%   BMI 32.92 kg/m   Physical Exam  Constitutional: She is oriented to person, place, and time. She appears well-developed and well-nourished.  HENT:  Head: Normocephalic and atraumatic.  Right Ear: External ear normal.  Left Ear: External ear normal.  Nose: Nose normal.  Mouth/Throat: Oropharynx is clear and moist.  Eyes: Pupils are equal, round, and reactive to light. Conjunctivae and EOM are normal.  Neck: Normal range of motion. Neck supple.  Cardiovascular: Normal rate, regular rhythm, normal  heart sounds and intact distal pulses.   Pulmonary/Chest: Effort normal and breath sounds normal.  Abdominal: Soft. Bowel sounds are normal.  Musculoskeletal:  LLE:  Swelling and tenderness to medial calf.  No redness.  Neurological: She is alert and oriented to person, place, and time.  Skin: Skin is warm. Capillary refill takes less than 2 seconds.  Psychiatric: She has a normal mood and affect. Her behavior is normal. Judgment and thought content normal.  Nursing note and vitals reviewed.    ED Treatments / Results  Labs (all labs ordered are listed, but only abnormal results are displayed) Labs Reviewed  BASIC METABOLIC PANEL  CBC WITH DIFFERENTIAL/PLATELET    EKG  EKG Interpretation None       Radiology Dg Tibia/fibula Left  Result Date: 02/26/2017 CLINICAL DATA:  Pain, swelling below left knee.  No injury. EXAM: LEFT TIBIA AND FIBULA - 2 VIEW COMPARISON:  None. FINDINGS: There is no evidence of fracture or other focal bone lesions. Soft tissues are unremarkable. IMPRESSION: Negative. Electronically Signed   By: Rolm Baptise M.D.   On: 02/26/2017 08:34   US Venous Img Lower Unilateral Left  Result Date: 02/26/2017 CLINICAL DATA:  Left lower extremity pain, swelling EXAM: LEFT LOWER EXTREMITY VENOUS DOPPLER ULTRASOUND TECHNIQUE: Gray-scale sonography with graded compression, as well as color Doppler and duplex ultrasound were performed to evaluate the lower extremity deep venous systems from the level of the common femoral vein and including the common femoral, femoral, profunda femoral, popliteal and calf veins including the posterior tibial, peroneal and gastrocnemius veins when visible. The superficial great saphenous vein was also interrogated. Spectral Doppler was utilized to evaluate flow at rest and with distal augmentation maneuvers in the common femoral, femoral and popliteal veins. COMPARISON:  None. FINDINGS: Contralateral Common Femoral Vein: Respiratory phasicity is  normal and symmetric with the symptomatic side. No evidence of thrombus. Normal compressibility. Common Femoral Vein: No evidence of thrombus. Normal compressibility, respiratory phasicity and response to augmentation. Saphenofemoral Junction: No evidence of thrombus. Normal compressibility and flow on color Doppler imaging. Profunda Femoral Vein: No evidence of thrombus. Normal compressibility and flow on color Doppler imaging. Femoral Vein: No evidence of thrombus. Normal compressibility, respiratory phasicity and response to augmentation. Popliteal Vein: No evidence of thrombus. Normal compressibility, respiratory phasicity and response to augmentation. Calf Veins: No evidence  of thrombus. Normal compressibility and flow on color Doppler imaging. Superficial Great Saphenous Vein: No evidence of thrombus. Normal compressibility and flow on color Doppler imaging. Venous Reflux:  None. Other Findings:  None. IMPRESSION: No evidence of DVT within the left lower extremity. Electronically Signed   By: Rolm Baptise M.D.   On: 02/26/2017 08:49    Procedures Procedures (including critical care time)  Medications Ordered in ED Medications  ketorolac (TORADOL) 30 MG/ML injection 30 mg (not administered)     Initial Impression / Assessment and Plan / ED Course  I have reviewed the triage vital signs and the nursing notes.  Pertinent labs & imaging results that were available during my care of the patient were reviewed by me and considered in my medical decision making (see chart for details).    No dvt.  I suspect it is a calf muscle tear or strain.  Pt to f/u with ortho.  She does not want a note for work as she is afraid she'll lose her job.  No light duty to do.  She knows to return if worse.  Final Clinical Impressions(s) / ED Diagnoses   Final diagnoses:  Strain of calf muscle, left, initial encounter    New Prescriptions New Prescriptions   KETOROLAC (TORADOL) 10 MG TABLET    Take 1 tablet  (10 mg total) by mouth every 6 (six) hours as needed. Do not take if you are taking Voltaren or any other NSAIDS.     Isla Pence, MD 02/26/17 0900

## 2017-02-26 NOTE — ED Notes (Signed)
Seen last Tuesday at Urgent Care and treated with Prednisone and tylenol. Waiting for [facility] in [place] for new pt consult.  Pt did work last night but continues to have pain.  Pt has ordered special shoes to help with leg Pain.

## 2017-02-26 NOTE — ED Triage Notes (Signed)
Pt reports pain and swelling in left leg below knee x 1 week.  Reports went to urgent care last week and was told it was inflammation.  Reports was given prednisone and told to take tylenol.  Pt says is no better.

## 2017-02-28 DIAGNOSIS — L659 Nonscarring hair loss, unspecified: Secondary | ICD-10-CM | POA: Diagnosis not present

## 2017-03-20 DIAGNOSIS — Z6832 Body mass index (BMI) 32.0-32.9, adult: Secondary | ICD-10-CM | POA: Diagnosis not present

## 2017-03-20 DIAGNOSIS — M79605 Pain in left leg: Secondary | ICD-10-CM | POA: Diagnosis not present

## 2017-04-14 DIAGNOSIS — M79605 Pain in left leg: Secondary | ICD-10-CM | POA: Diagnosis not present

## 2017-04-14 DIAGNOSIS — Z6833 Body mass index (BMI) 33.0-33.9, adult: Secondary | ICD-10-CM | POA: Diagnosis not present

## 2017-04-23 ENCOUNTER — Other Ambulatory Visit: Payer: BLUE CROSS/BLUE SHIELD | Admitting: Adult Health

## 2017-04-25 DIAGNOSIS — L218 Other seborrheic dermatitis: Secondary | ICD-10-CM | POA: Diagnosis not present

## 2017-04-25 DIAGNOSIS — L659 Nonscarring hair loss, unspecified: Secondary | ICD-10-CM | POA: Diagnosis not present

## 2017-04-29 DIAGNOSIS — E782 Mixed hyperlipidemia: Secondary | ICD-10-CM | POA: Diagnosis not present

## 2017-04-29 DIAGNOSIS — M79605 Pain in left leg: Secondary | ICD-10-CM | POA: Diagnosis not present

## 2017-04-29 DIAGNOSIS — Z6833 Body mass index (BMI) 33.0-33.9, adult: Secondary | ICD-10-CM | POA: Diagnosis not present

## 2017-04-30 DIAGNOSIS — M79605 Pain in left leg: Secondary | ICD-10-CM | POA: Diagnosis not present

## 2017-04-30 DIAGNOSIS — M25562 Pain in left knee: Secondary | ICD-10-CM | POA: Diagnosis not present

## 2017-04-30 DIAGNOSIS — R262 Difficulty in walking, not elsewhere classified: Secondary | ICD-10-CM | POA: Diagnosis not present

## 2017-05-06 DIAGNOSIS — R262 Difficulty in walking, not elsewhere classified: Secondary | ICD-10-CM | POA: Diagnosis not present

## 2017-05-06 DIAGNOSIS — M79605 Pain in left leg: Secondary | ICD-10-CM | POA: Diagnosis not present

## 2017-05-06 DIAGNOSIS — M25562 Pain in left knee: Secondary | ICD-10-CM | POA: Diagnosis not present

## 2017-05-15 DIAGNOSIS — R262 Difficulty in walking, not elsewhere classified: Secondary | ICD-10-CM | POA: Diagnosis not present

## 2017-05-15 DIAGNOSIS — M25562 Pain in left knee: Secondary | ICD-10-CM | POA: Diagnosis not present

## 2017-05-15 DIAGNOSIS — M79605 Pain in left leg: Secondary | ICD-10-CM | POA: Diagnosis not present

## 2017-05-22 ENCOUNTER — Encounter: Payer: Self-pay | Admitting: Adult Health

## 2017-05-22 ENCOUNTER — Ambulatory Visit (INDEPENDENT_AMBULATORY_CARE_PROVIDER_SITE_OTHER): Payer: BLUE CROSS/BLUE SHIELD | Admitting: Adult Health

## 2017-05-22 ENCOUNTER — Other Ambulatory Visit: Payer: Self-pay

## 2017-05-22 VITALS — BP 130/88 | HR 57 | Ht 62.0 in | Wt 185.0 lb

## 2017-05-22 DIAGNOSIS — Z1211 Encounter for screening for malignant neoplasm of colon: Secondary | ICD-10-CM | POA: Diagnosis not present

## 2017-05-22 DIAGNOSIS — Z01419 Encounter for gynecological examination (general) (routine) without abnormal findings: Secondary | ICD-10-CM

## 2017-05-22 DIAGNOSIS — M25562 Pain in left knee: Secondary | ICD-10-CM | POA: Insufficient documentation

## 2017-05-22 DIAGNOSIS — Z1212 Encounter for screening for malignant neoplasm of rectum: Secondary | ICD-10-CM | POA: Diagnosis not present

## 2017-05-22 LAB — HEMOCCULT GUIAC POC 1CARD (OFFICE): Fecal Occult Blood, POC: NEGATIVE

## 2017-05-22 NOTE — Progress Notes (Addendum)
Patient ID: Shelby Guerra, female   DOB: 1955-10-06, 61 y.o.   MRN: [id number] History of Present Illness: [name] is a 61 year old black female, single, sp hysterectomy in for well woman gyn exam. PCP is [facility].    Current Medications, Allergies, Past Medical History, Past Surgical History, Family History and Social History were reviewed in [employer] record.     Review of Systems:  Patient denies any headaches, hearing loss, fatigue, blurred vision, shortness of breath, chest pain, abdominal pain, problems with bowel movements, urination, or intercourse. No mood swings.+pain in left knee, has been to PT, but still hurts    Physical Exam:BP 130/88 (BP Location: Left Arm, Patient Position: Sitting, Cuff Size: Normal)   Pulse (!) 57   Ht 5\' 2"  (1.575 m)   Wt 185 lb (83.9 kg)   BMI 33.84 kg/m  General:  Well developed, well nourished, no acute distress Skin:  Warm and dry Neck:  Midline trachea, normal thyroid, good ROM, no lymphadenopathy,no carotid bruits heard Lungs; Clear to auscultation bilaterally Breast:  No dominant palpable mass, retraction, or nipple discharge,left nipple chronically inverted  Cardiovascular: Regular rate and rhythm Abdomen:  Soft, non tender, no hepatosplenomegaly Pelvic:  External genitalia is normal in appearance, no lesions.  The vagina is normal in appearance. Urethra has no lesions or masses. The cervix and uterus are absent. No adnexal masses or tenderness noted.Bladder is non tender, no masses felt. Rectal: Good sphincter tone, no polyps, or hemorrhoids felt.  Hemoccult negative. Extremities/musculoskeletal:  No varicosities noted, no clubbing or cyanosis,+swelling left knee and point tenderness over bursa  Psych:  No mood changes, alert and cooperative,seems happy PHQ 2 score 0.  Impression: 1. Well female exam with routine gynecological exam   2. Screening for colorectal cancer   3. Left knee pain, unspecified  chronicity       Plan:  Physical in 1 year No longer gets pap, is sp hysterectomy  Mammogram yearly Colonoscopy per GI Labs with PCP See PCP or orthopedic doctor about knee

## 2017-05-27 DIAGNOSIS — R262 Difficulty in walking, not elsewhere classified: Secondary | ICD-10-CM | POA: Diagnosis not present

## 2017-05-27 DIAGNOSIS — M25562 Pain in left knee: Secondary | ICD-10-CM | POA: Diagnosis not present

## 2017-05-27 DIAGNOSIS — M79605 Pain in left leg: Secondary | ICD-10-CM | POA: Diagnosis not present

## 2017-05-28 ENCOUNTER — Ambulatory Visit: Payer: BLUE CROSS/BLUE SHIELD | Admitting: Orthopaedic Surgery

## 2017-05-28 ENCOUNTER — Encounter: Payer: Self-pay | Admitting: Orthopaedic Surgery

## 2017-05-28 VITALS — BP 146/87 | HR 62 | Temp 97.8°F | Ht 62.5 in | Wt 184.0 lb

## 2017-05-28 DIAGNOSIS — M79605 Pain in left leg: Secondary | ICD-10-CM

## 2017-05-28 MED ORDER — PREDNISONE 5 MG (21) PO TBPK: ORAL_TABLET | ORAL | 0 refills | Status: DC

## 2017-05-28 NOTE — Progress Notes (Signed)
Subjective:    Patient ID: Shelby Guerra, female    DOB: [birth year], 61 y.o.   MRN: [id number]  HPI She has three to four month history of left proximal medial tibial leg pain.  She has no trauma.  Her pain area is diffuse.  She has no knee pain.  She has no giving way or locking.  She has been seen at Urgent care, the ER and by her family doctor.  She has negative x-rays times two.  She has had Doppler study which was negative. She has used ice and heat with no help.  She has been on ibuprofen and now diclofenac.  She has no redness, no swelling, no numbness.  She works third shift and often works 12 hours at a time.  Nothing seems to help.  She is going to PT now and that is only helpful for a short time.   Review of Systems  Musculoskeletal: Positive for arthralgias and gait problem.  All other systems reviewed and are negative.  Past Medical History:  Diagnosis Date  . GERD (gastroesophageal reflux disease)   . High cholesterol   . Hot flashes 03/10/2013  . Hypertension    pt denies  . Impaired fasting glucose   . Kidney stone    oxalate/uric acid  . Moody 03/14/2014    Past Surgical History:  Procedure Laterality Date  . ABDOMINAL HYSTERECTOMY    . COLONOSCOPY    12/13/2008   Dr. Gala Romney: Normal rectum and colon  . INGUINAL HERNIA REPAIR    . TUBAL LIGATION      Current Outpatient Medications on File Prior to Visit  Medication Sig Dispense Refill  . diclofenac (VOLTAREN) 75 MG EC tablet Take 75 mg by mouth 2 (two) times daily.    . ranitidine (ZANTAC) 150 MG tablet Take 1 tablet (150 mg total) by mouth 2 (two) times daily. 20 tablet 0  . venlafaxine XR (EFFEXOR-XR) 150 MG 24 hr capsule Take 1 capsule (150 mg total) by mouth daily with breakfast. 30 capsule 11   No current facility-administered medications on file prior to visit.     Social History   Socioeconomic History  . Marital status: Single    Spouse name: Not on file  . Number of children: Not on file  .  Years of education: Not on file  . Highest education level: Not on file  Social Needs  . Financial resource strain: Not on file  . Food insecurity - worry: Not on file  . Food insecurity - inability: Not on file  . Transportation needs - medical: Not on file  . Transportation needs - non-medical: Not on file  Occupational History  . Not on file  Tobacco Use  . Smoking status: Never Smoker  . Smokeless tobacco: Never Used  Substance and Sexual Activity  . Alcohol use: No  . Drug use: No  . Sexual activity: No    Partners: Male    Birth control/protection: Surgical    Comment: hyst  Other Topics Concern  . Not on file  Social History Narrative  . Not on file    Family History  Problem Relation Age of Onset  . Hypertension Brother   . Kidney failure Brother        had kidney transplant  . Hyperlipidemia Brother   . Hypertension Sister   . Hyperlipidemia Sister   . Cancer Sister        cervical  . Hypertension Brother   .  Kidney failure Father   . Cancer Mother   . Colon cancer Neg Hx     BP (!) 146/87   Pulse 62   Temp 97.8 F (36.6 C)   Ht 5' 2.5" (1.588 m)   Wt 184 lb (83.5 kg)   BMI 33.12 kg/m      Objective:   Physical Exam  Constitutional: She is oriented to person, place, and time. She appears well-developed and well-nourished.  HENT:  Head: Normocephalic and atraumatic.  Eyes: Conjunctivae and EOM are normal. Pupils are equal, round, and reactive to light.  Neck: Normal range of motion. Neck supple.  Cardiovascular: Normal rate, regular rhythm and intact distal pulses.  Pulmonary/Chest: Effort normal.  Abdominal: Soft.  Musculoskeletal: She exhibits tenderness (Left medial proixmal lower leg tender diffusely, no redness, no swelling, no numbness.  ROM of knee full on the left with no pain.  NV intact.  Gait normal.  ).  Neurological: She is alert and oriented to person, place, and time. She displays normal reflexes. No cranial nerve deficit. She  exhibits normal muscle tone. Coordination normal.  Skin: Skin is warm and dry.  Psychiatric: She has a normal mood and affect. Her behavior is normal. Judgment and thought content normal.  Vitals reviewed.         Assessment & Plan:   Encounter Diagnosis  Name Primary?  . Left leg pain Yes   I will have her begin prednisone dose pack.  Hold the diclofenac while on this medicine.  Continue the PT.  Use BioFreeze to the area as needed.  Return in three weeks.  Call if any problem.  Precautions discussed.   Electronically Signed Sanjuana Kava, MD 12/19/20189:15 AM

## 2017-06-02 DIAGNOSIS — M25562 Pain in left knee: Secondary | ICD-10-CM | POA: Diagnosis not present

## 2017-06-02 DIAGNOSIS — M79605 Pain in left leg: Secondary | ICD-10-CM | POA: Diagnosis not present

## 2017-06-02 DIAGNOSIS — R262 Difficulty in walking, not elsewhere classified: Secondary | ICD-10-CM | POA: Diagnosis not present

## 2017-06-05 DIAGNOSIS — M79605 Pain in left leg: Secondary | ICD-10-CM | POA: Diagnosis not present

## 2017-06-05 DIAGNOSIS — M25562 Pain in left knee: Secondary | ICD-10-CM | POA: Diagnosis not present

## 2017-06-05 DIAGNOSIS — R262 Difficulty in walking, not elsewhere classified: Secondary | ICD-10-CM | POA: Diagnosis not present

## 2017-06-09 DIAGNOSIS — R262 Difficulty in walking, not elsewhere classified: Secondary | ICD-10-CM | POA: Diagnosis not present

## 2017-06-09 DIAGNOSIS — M25562 Pain in left knee: Secondary | ICD-10-CM | POA: Diagnosis not present

## 2017-06-09 DIAGNOSIS — M79605 Pain in left leg: Secondary | ICD-10-CM | POA: Diagnosis not present

## 2017-06-18 ENCOUNTER — Ambulatory Visit: Payer: BLUE CROSS/BLUE SHIELD | Admitting: Orthopaedic Surgery

## 2017-06-19 DIAGNOSIS — Z6831 Body mass index (BMI) 31.0-31.9, adult: Secondary | ICD-10-CM | POA: Diagnosis not present

## 2017-06-19 DIAGNOSIS — R5383 Other fatigue: Secondary | ICD-10-CM | POA: Diagnosis not present

## 2017-06-19 DIAGNOSIS — R112 Nausea with vomiting, unspecified: Secondary | ICD-10-CM | POA: Diagnosis not present

## 2017-06-19 DIAGNOSIS — K529 Noninfective gastroenteritis and colitis, unspecified: Secondary | ICD-10-CM | POA: Diagnosis not present

## 2017-07-01 DIAGNOSIS — M1712 Unilateral primary osteoarthritis, left knee: Secondary | ICD-10-CM | POA: Diagnosis not present

## 2017-07-12 ENCOUNTER — Other Ambulatory Visit: Payer: Self-pay | Admitting: Adult Health

## 2017-07-14 ENCOUNTER — Telehealth: Payer: Self-pay

## 2017-07-14 NOTE — Telephone Encounter (Signed)
Received a fax refill request from [facility] requesting a refill for Pantoprazole 40mg , #30, take one tab each day 30 mins before breakfast.   Last ov 01/31/15

## 2017-07-14 NOTE — Telephone Encounter (Signed)
Opened in error

## 2017-07-14 NOTE — Telephone Encounter (Signed)
Refill request signed for fax back to pharmacy.

## 2017-07-25 DIAGNOSIS — M25562 Pain in left knee: Secondary | ICD-10-CM | POA: Diagnosis not present

## 2017-08-29 DIAGNOSIS — M1712 Unilateral primary osteoarthritis, left knee: Secondary | ICD-10-CM | POA: Diagnosis not present

## 2017-09-02 DIAGNOSIS — M1712 Unilateral primary osteoarthritis, left knee: Secondary | ICD-10-CM | POA: Diagnosis not present

## 2017-09-09 DIAGNOSIS — M1712 Unilateral primary osteoarthritis, left knee: Secondary | ICD-10-CM | POA: Diagnosis not present

## 2017-09-16 DIAGNOSIS — M1712 Unilateral primary osteoarthritis, left knee: Secondary | ICD-10-CM | POA: Diagnosis not present

## 2017-11-04 DIAGNOSIS — M1712 Unilateral primary osteoarthritis, left knee: Secondary | ICD-10-CM | POA: Diagnosis not present

## 2017-11-05 ENCOUNTER — Other Ambulatory Visit: Payer: Self-pay | Admitting: Adult Health

## 2017-11-05 DIAGNOSIS — Z1231 Encounter for screening mammogram for malignant neoplasm of breast: Secondary | ICD-10-CM

## 2017-11-11 DIAGNOSIS — M25562 Pain in left knee: Secondary | ICD-10-CM | POA: Diagnosis not present

## 2017-11-27 ENCOUNTER — Encounter (HOSPITAL_BASED_OUTPATIENT_CLINIC_OR_DEPARTMENT_OTHER): Payer: Self-pay | Admitting: *Deleted

## 2017-11-28 ENCOUNTER — Other Ambulatory Visit: Payer: Self-pay

## 2017-11-28 ENCOUNTER — Encounter (HOSPITAL_BASED_OUTPATIENT_CLINIC_OR_DEPARTMENT_OTHER): Payer: Self-pay | Admitting: *Deleted

## 2017-12-03 NOTE — Anesthesia Preprocedure Evaluation (Signed)
Anesthesia Evaluation  Patient identified by MRN, date of birth, ID band Patient awake    Reviewed: Allergy & Precautions, H&P , NPO status , Patient's Chart, lab work & pertinent test results, reviewed documented beta blocker date and time   Airway Mallampati: II  TM Distance: >3 FB Neck ROM: full    Dental no notable dental hx.    Pulmonary    Pulmonary exam normal breath sounds clear to auscultation       Cardiovascular Exercise Tolerance: Good hypertension,  Rhythm:regular Rate:Normal     Neuro/Psych Depression    GI/Hepatic GERD  Medicated,  Endo/Other    Renal/GU Renal disease  negative genitourinary   Musculoskeletal   Abdominal (+) + obese,   Peds  Hematology   Anesthesia Other Findings   Reproductive/Obstetrics                             Anesthesia Physical Anesthesia Plan  ASA: II  Anesthesia Plan: General   Post-op Pain Management:    Induction: Intravenous  PONV Risk Score and Plan: 3 and Ondansetron, Dexamethasone and Treatment may vary due to age or medical condition  Airway Management Planned: LMA  Additional Equipment:   Intra-op Plan:   Post-operative Plan:   Informed Consent: I have reviewed the patients History and Physical, chart, labs and discussed the procedure including the risks, benefits and alternatives for the proposed anesthesia with the patient or authorized representative who has indicated his/her understanding and acceptance.   Dental Advisory Given  Plan Discussed with: CRNA, Anesthesiologist and Surgeon  Anesthesia Plan Comments: ( )        Anesthesia Quick Evaluation

## 2017-12-04 ENCOUNTER — Ambulatory Visit (HOSPITAL_BASED_OUTPATIENT_CLINIC_OR_DEPARTMENT_OTHER)
Admission: RE | Admit: 2017-12-04 | Discharge: 2017-12-04 | Disposition: A | Discharge status: Home / Self Care | Payer: BLUE CROSS/BLUE SHIELD | Source: Ambulatory Visit | Attending: Orthopaedic Surgery | Admitting: Orthopaedic Surgery

## 2017-12-04 ENCOUNTER — Ambulatory Visit (HOSPITAL_BASED_OUTPATIENT_CLINIC_OR_DEPARTMENT_OTHER): Payer: BLUE CROSS/BLUE SHIELD | Admitting: Anesthesiology

## 2017-12-04 ENCOUNTER — Ambulatory Visit (HOSPITAL_COMMUNITY): Payer: BLUE CROSS/BLUE SHIELD

## 2017-12-04 ENCOUNTER — Other Ambulatory Visit: Payer: Self-pay

## 2017-12-04 ENCOUNTER — Encounter (HOSPITAL_BASED_OUTPATIENT_CLINIC_OR_DEPARTMENT_OTHER)
Admission: RE | Disposition: A | Discharge status: Home / Self Care | Payer: Self-pay | Source: Ambulatory Visit | Attending: Orthopaedic Surgery

## 2017-12-04 ENCOUNTER — Encounter (HOSPITAL_BASED_OUTPATIENT_CLINIC_OR_DEPARTMENT_OTHER): Payer: Self-pay | Admitting: *Deleted

## 2017-12-04 DIAGNOSIS — Z885 Allergy status to narcotic agent status: Secondary | ICD-10-CM | POA: Diagnosis not present

## 2017-12-04 DIAGNOSIS — M23222 Derangement of posterior horn of medial meniscus due to old tear or injury, left knee: Secondary | ICD-10-CM | POA: Insufficient documentation

## 2017-12-04 DIAGNOSIS — M2242 Chondromalacia patellae, left knee: Secondary | ICD-10-CM | POA: Insufficient documentation

## 2017-12-04 DIAGNOSIS — M1712 Unilateral primary osteoarthritis, left knee: Secondary | ICD-10-CM | POA: Insufficient documentation

## 2017-12-04 DIAGNOSIS — F329 Major depressive disorder, single episode, unspecified: Secondary | ICD-10-CM | POA: Diagnosis not present

## 2017-12-04 DIAGNOSIS — Z79899 Other long term (current) drug therapy: Secondary | ICD-10-CM | POA: Insufficient documentation

## 2017-12-04 DIAGNOSIS — E669 Obesity, unspecified: Secondary | ICD-10-CM | POA: Diagnosis not present

## 2017-12-04 DIAGNOSIS — Z6834 Body mass index (BMI) 34.0-34.9, adult: Secondary | ICD-10-CM | POA: Insufficient documentation

## 2017-12-04 DIAGNOSIS — I10 Essential (primary) hypertension: Secondary | ICD-10-CM | POA: Diagnosis not present

## 2017-12-04 DIAGNOSIS — E7800 Pure hypercholesterolemia, unspecified: Secondary | ICD-10-CM | POA: Diagnosis not present

## 2017-12-04 DIAGNOSIS — K219 Gastro-esophageal reflux disease without esophagitis: Secondary | ICD-10-CM | POA: Diagnosis not present

## 2017-12-04 DIAGNOSIS — S83242A Other tear of medial meniscus, current injury, left knee, initial encounter: Secondary | ICD-10-CM | POA: Diagnosis not present

## 2017-12-04 HISTORY — PX: KNEE ARTHROSCOPY WITH MEDIAL MENISECTOMY: SHX5651

## 2017-12-04 SURGERY — ARTHROSCOPY, KNEE, WITH MEDIAL MENISCECTOMY
Anesthesia: General | Site: Knee | Laterality: Left

## 2017-12-04 MED ORDER — LIDOCAINE HCL (CARDIAC) PF 100 MG/5ML IV SOSY
PREFILLED_SYRINGE | INTRAVENOUS | Status: AC
  Filled 2017-12-04: qty 5

## 2017-12-04 MED ORDER — BUPIVACAINE HCL (PF) 0.25 % IJ SOLN
INTRAMUSCULAR | Status: AC
  Filled 2017-12-04: qty 30

## 2017-12-04 MED ORDER — FENTANYL CITRATE (PF) 100 MCG/2ML IJ SOLN
INTRAMUSCULAR | Status: AC
  Filled 2017-12-04: qty 2

## 2017-12-04 MED ORDER — OXYCODONE HCL 5 MG PO TABS
ORAL_TABLET | ORAL | 0 refills | Status: AC
Start: 2017-12-04 — End: 2017-12-09

## 2017-12-04 MED ORDER — ACETAMINOPHEN 160 MG/5ML PO SOLN: 325.0000 mg | ORAL | Status: DC | PRN

## 2017-12-04 MED ORDER — ONDANSETRON HCL 4 MG/2ML IJ SOLN: 4.0000 mg | Freq: Once | INTRAMUSCULAR | Status: DC | PRN

## 2017-12-04 MED ORDER — BUPIVACAINE HCL (PF) 0.5 % IJ SOLN
INTRAMUSCULAR | Status: AC
  Filled 2017-12-04: qty 30

## 2017-12-04 MED ORDER — CHLORHEXIDINE GLUCONATE 4 % EX LIQD: 60.0000 mL | Freq: Once | CUTANEOUS | Status: DC

## 2017-12-04 MED ORDER — ACETAMINOPHEN 325 MG PO TABS: 325.0000 mg | ORAL_TABLET | ORAL | Status: DC | PRN

## 2017-12-04 MED ORDER — EPINEPHRINE 30 MG/30ML IJ SOLN
INTRAMUSCULAR | Status: AC
  Filled 2017-12-04: qty 1

## 2017-12-04 MED ORDER — DEXAMETHASONE SODIUM PHOSPHATE 10 MG/ML IJ SOLN
INTRAMUSCULAR | Status: AC
  Filled 2017-12-04: qty 1

## 2017-12-04 MED ORDER — FENTANYL CITRATE (PF) 100 MCG/2ML IJ SOLN
50.0000 ug | INTRAMUSCULAR | Status: AC | PRN
  Administered 2017-12-04 (×2): 50 ug via INTRAVENOUS
  Administered 2017-12-04: 25 ug via INTRAVENOUS
  Administered 2017-12-04: 50 ug via INTRAVENOUS

## 2017-12-04 MED ORDER — SODIUM CHLORIDE 0.9 % IR SOLN
Status: DC | PRN
  Administered 2017-12-04: 2 mL

## 2017-12-04 MED ORDER — PROPOFOL 10 MG/ML IV BOLUS
INTRAVENOUS | Status: DC | PRN
  Administered 2017-12-04: 150 mg via INTRAVENOUS

## 2017-12-04 MED ORDER — OXYCODONE HCL 5 MG PO TABS: 5.0000 mg | ORAL_TABLET | Freq: Once | ORAL | Status: DC | PRN

## 2017-12-04 MED ORDER — BUPIVACAINE-EPINEPHRINE (PF) 0.25% -1:200000 IJ SOLN
INTRAMUSCULAR | Status: AC
  Filled 2017-12-04: qty 30

## 2017-12-04 MED ORDER — CEFAZOLIN SODIUM-DEXTROSE 2-4 GM/100ML-% IV SOLN
2.0000 g | INTRAVENOUS | Status: AC
  Administered 2017-12-04: 2 g via INTRAVENOUS

## 2017-12-04 MED ORDER — CEFAZOLIN SODIUM-DEXTROSE 2-4 GM/100ML-% IV SOLN
INTRAVENOUS | Status: AC
  Filled 2017-12-04: qty 100

## 2017-12-04 MED ORDER — BUPIVACAINE-EPINEPHRINE (PF) 0.5% -1:200000 IJ SOLN
INTRAMUSCULAR | Status: AC
  Filled 2017-12-04: qty 30

## 2017-12-04 MED ORDER — MIDAZOLAM HCL 2 MG/2ML IJ SOLN
1.0000 mg | INTRAMUSCULAR | Status: DC | PRN
  Administered 2017-12-04: 2 mg via INTRAVENOUS

## 2017-12-04 MED ORDER — OMEPRAZOLE 20 MG PO CPDR: 20.0000 mg | DELAYED_RELEASE_CAPSULE | Freq: Every day | ORAL | 0 refills | Status: DC

## 2017-12-04 MED ORDER — MEPERIDINE HCL 25 MG/ML IJ SOLN: 6.2500 mg | INTRAMUSCULAR | Status: DC | PRN

## 2017-12-04 MED ORDER — KETOROLAC TROMETHAMINE 30 MG/ML IJ SOLN
INTRAMUSCULAR | Status: DC | PRN
  Administered 2017-12-04: 30 mg via INTRAVENOUS

## 2017-12-04 MED ORDER — ONDANSETRON HCL 4 MG/2ML IJ SOLN
INTRAMUSCULAR | Status: DC | PRN
  Administered 2017-12-04: 4 mg via INTRAVENOUS

## 2017-12-04 MED ORDER — SCOPOLAMINE 1 MG/3DAYS TD PT72: 1.0000 | MEDICATED_PATCH | Freq: Once | TRANSDERMAL | Status: DC | PRN

## 2017-12-04 MED ORDER — LIDOCAINE HCL (CARDIAC) PF 100 MG/5ML IV SOSY
PREFILLED_SYRINGE | INTRAVENOUS | Status: DC | PRN
  Administered 2017-12-04: 60 mg via INTRAVENOUS

## 2017-12-04 MED ORDER — LACTATED RINGERS IV SOLN
INTRAVENOUS | Status: DC
  Administered 2017-12-04: 07:00:00 via INTRAVENOUS

## 2017-12-04 MED ORDER — BUPIVACAINE HCL (PF) 0.25 % IJ SOLN
INTRAMUSCULAR | Status: DC | PRN
  Administered 2017-12-04: 20 mL

## 2017-12-04 MED ORDER — MIDAZOLAM HCL 2 MG/2ML IJ SOLN
INTRAMUSCULAR | Status: AC
  Filled 2017-12-04: qty 2

## 2017-12-04 MED ORDER — ONDANSETRON HCL 4 MG/2ML IJ SOLN
INTRAMUSCULAR | Status: AC
  Filled 2017-12-04: qty 2

## 2017-12-04 MED ORDER — DEXAMETHASONE SODIUM PHOSPHATE 10 MG/ML IJ SOLN
INTRAMUSCULAR | Status: DC | PRN
  Administered 2017-12-04: 10 mg via INTRAVENOUS

## 2017-12-04 MED ORDER — OXYCODONE HCL 5 MG/5ML PO SOLN: 5.0000 mg | Freq: Once | ORAL | Status: DC | PRN

## 2017-12-04 MED ORDER — FENTANYL CITRATE (PF) 100 MCG/2ML IJ SOLN
25.0000 ug | INTRAMUSCULAR | Status: DC | PRN
  Administered 2017-12-04: 25 ug via INTRAVENOUS

## 2017-12-04 MED ORDER — ASPIRIN 81 MG PO TABS: 81.0000 mg | ORAL_TABLET | Freq: Every day | ORAL | 0 refills | Status: AC

## 2017-12-04 MED ORDER — PROPOFOL 500 MG/50ML IV EMUL
INTRAVENOUS | Status: AC
  Filled 2017-12-04: qty 50

## 2017-12-04 MED ORDER — EPHEDRINE SULFATE 50 MG/ML IJ SOLN
INTRAMUSCULAR | Status: DC | PRN
  Administered 2017-12-04: 10 mg via INTRAVENOUS

## 2017-12-04 MED ORDER — SODIUM CHLORIDE 0.9 % IR SOLN
Status: DC | PRN
  Administered 2017-12-04: 6000 mL

## 2017-12-04 MED ORDER — ONDANSETRON HCL 4 MG PO TABS: 4.0000 mg | ORAL_TABLET | Freq: Three times a day (TID) | ORAL | 1 refills | Status: AC | PRN

## 2017-12-04 MED ORDER — ACETAMINOPHEN 500 MG PO TABS: 1000.0000 mg | ORAL_TABLET | Freq: Three times a day (TID) | ORAL | 0 refills | Status: AC

## 2017-12-04 SURGICAL SUPPLY — 42 items
APL SKNCLS STERI-STRIP NONHPOA (GAUZE/BANDAGES/DRESSINGS) ×1
BANDAGE ACE 6X5 VEL STRL LF (GAUZE/BANDAGES/DRESSINGS) IMPLANT
BANDAGE ESMARK 6X9 LF (GAUZE/BANDAGES/DRESSINGS) IMPLANT
BENZOIN TINCTURE PRP APPL 2/3 (GAUZE/BANDAGES/DRESSINGS) ×2 IMPLANT
BLADE CLIPPER SURG (BLADE) IMPLANT
BNDG CMPR 9X6 STRL LF SNTH (GAUZE/BANDAGES/DRESSINGS)
BNDG ESMARK 6X9 LF (GAUZE/BANDAGES/DRESSINGS)
CHLORAPREP W/TINT 26ML (MISCELLANEOUS) ×2 IMPLANT
CUFF TOURNIQUET SINGLE 34IN LL (TOURNIQUET CUFF) ×1 IMPLANT
DISSECTOR 3.5MM X 13CM CVD (MISCELLANEOUS) ×1 IMPLANT
DISSECTOR 4.0MMX13CM CVD (MISCELLANEOUS) ×1 IMPLANT
DRAPE ARTHROSCOPY W/POUCH 90 (DRAPES) ×2 IMPLANT
DRAPE IMP U-DRAPE 54X76 (DRAPES) ×2 IMPLANT
DRAPE INCISE IOBAN 66X45 STRL (DRAPES) IMPLANT
DRAPE U-SHAPE 47X51 STRL (DRAPES) ×2 IMPLANT
GAUZE SPONGE 4X4 12PLY STRL (GAUZE/BANDAGES/DRESSINGS) ×1 IMPLANT
GLOVE BIOGEL PI IND STRL 7.0 (GLOVE) IMPLANT
GLOVE BIOGEL PI IND STRL 8 (GLOVE) ×1 IMPLANT
GLOVE BIOGEL PI INDICATOR 7.0 (GLOVE) ×1
GLOVE BIOGEL PI INDICATOR 8 (GLOVE) ×3
GLOVE ECLIPSE 6.5 STRL STRAW (GLOVE) ×1 IMPLANT
GLOVE ECLIPSE 8.0 STRL XLNG CF (GLOVE) ×4 IMPLANT
GLOVE SS BIOGEL STRL SZ 7.5 (GLOVE) IMPLANT
GLOVE SUPERSENSE BIOGEL SZ 7.5 (GLOVE) ×1
GOWN STRL REUS W/ TWL LRG LVL3 (GOWN DISPOSABLE) ×1 IMPLANT
GOWN STRL REUS W/TWL LRG LVL3 (GOWN DISPOSABLE) ×2
GOWN STRL REUS W/TWL XL LVL3 (GOWN DISPOSABLE) ×3 IMPLANT
KIT TURNOVER KIT B (KITS) ×2 IMPLANT
MANIFOLD NEPTUNE II (INSTRUMENTS) ×1 IMPLANT
NDL SAFETY ECLIPSE 18X1.5 (NEEDLE) ×1 IMPLANT
NEEDLE HYPO 18GX1.5 SHARP (NEEDLE) ×2
NS IRRIG 1000ML POUR BTL (IV SOLUTION) IMPLANT
PACK ARTHROSCOPY DSU (CUSTOM PROCEDURE TRAY) ×2 IMPLANT
PROBE BIPOLAR ATHRO 135MM 90D (MISCELLANEOUS) IMPLANT
SLEEVE SCD COMPRESS KNEE MED (MISCELLANEOUS) ×2 IMPLANT
STRIP CLOSURE SKIN 1/2X4 (GAUZE/BANDAGES/DRESSINGS) ×2 IMPLANT
SUT MNCRL AB 4-0 PS2 18 (SUTURE) ×2 IMPLANT
SYR 5ML LUER SLIP (SYRINGE) ×2 IMPLANT
TOWEL GREEN STERILE FF (TOWEL DISPOSABLE) ×2 IMPLANT
TUBE CONNECTING 20X1/4 (TUBING) ×2 IMPLANT
TUBING ARTHROSCOPY IRRIG 16FT (MISCELLANEOUS) ×2 IMPLANT
WATER STERILE IRR 1000ML POUR (IV SOLUTION) ×2 IMPLANT

## 2017-12-04 NOTE — H&P (Signed)
PREOPERATIVE H&P  Chief Complaint: CHONDROMALACIA PATELLA AND MEDICAL MENISCUS TEAR  HPI: Shelby Guerra is a 62 y.o. 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  This is significantly impairing activities of daily living.  Please see my clinic note for full details on this patient's care.  She has elected for surgical management.   Past Medical History:  Diagnosis Date  . GERD (gastroesophageal reflux disease)   . High cholesterol   . Hot flashes 03/10/2013  . Hypertension    pt denies  . Impaired fasting glucose   . Kidney stone    oxalate/uric acid  . Moody 03/14/2014   Past Surgical History:  Procedure Laterality Date  . COLONOSCOPY  12/13/2008  . INGUINAL HERNIA REPAIR Left 02/26/2006   recurrent  . INGUINAL HERNIA REPAIR Left    prior to 2007  . TOTAL ABDOMINAL HYSTERECTOMY W/ BILATERAL SALPINGOOPHORECTOMY  10/15/2006  . TUBAL LIGATION     Social History   Socioeconomic History  . Marital status: Single    Spouse name: Not on file  . Number of children: Not on file  . Years of education: Not on file  . Highest education level: Not on file  Occupational History  . Not on file  Social Needs  . Financial resource strain: Not on file  . Food insecurity:    Worry: Not on file    Inability: Not on file  . Transportation needs:    Medical: Not on file    Non-medical: Not on file  Tobacco Use  . Smoking status: Never Smoker  . Smokeless tobacco: Never Used  Substance and Sexual Activity  . Alcohol use: No  . Drug use: No  . Sexual activity: Never    Partners: Male    Birth control/protection: Surgical    Comment: hyst  Lifestyle  . Physical activity:    Days per week: Not on file    Minutes per session: Not on file  . Stress: Not on file  Relationships  . Social connections:    Talks on phone: Not on file     Gets together: Not on file    Attends religious service: Not on file    Active member of club or organization: Not on file    Attends meetings of clubs or organizations: Not on file    Relationship status: Not on file  Other Topics Concern  . Not on file  Social History Narrative  . Not on file   Family History  Problem Relation Age of Onset  . Hypertension Brother   . Kidney failure Brother        had kidney transplant  . Hyperlipidemia Brother   . Hypertension Sister   . Hyperlipidemia Sister   . Cancer Sister        cervical  . Hypertension Brother   . Kidney failure Father   . Cancer Mother   . Colon cancer Neg Hx    Allergies  Allergen Reactions  . Codeine Itching    Can take vicodin  . Propoxyphene N-Acetaminophen Nausea Only    Darvocet   Prior to Admission medications   Medication Sig Start Date End Date Taking? Authorizing Provider  Multiple Vitamin (MULTIVITAMIN) tablet Take 1 tablet by mouth daily.   Yes [provider]  Naproxen-Esomeprazole (VIMOVO) 500-20 MG TBEC Take 500 mg by mouth 2 (two) times  daily.   Yes [provider]  Venlafaxine HCl (EFFEXOR XR PO) Take by mouth daily.   Yes [provider]     Positive ROS: All other systems have been reviewed and were otherwise negative with the exception of those mentioned in the HPI and as above.  Physical Exam: General: Alert, no acute distress Cardiovascular: No pedal edema Respiratory: No cyanosis, no use of accessory musculature GI: No organomegaly, abdomen is soft and non-tender Skin: No lesions in the area of chief complaint Neurologic: Sensation intact distally Psychiatric: Patient is competent for consent with normal mood and affect Lymphatic: No axillary or cervical lymphadenopathy  MUSCULOSKELETAL: L knee, ttp at joint more severe than over pes bursa.  ROM preserved  Assessment: CHONDROMALACIA PATELLA AND MEDICAL MENISCUS TEAR  Plan: Plan for  Procedure(s): KNEE ARTHROSCOPY WITH MEDIAL MENISCECTOMY AND CHONDROPLASTY  The risks benefits and alternatives were discussed with the patient including but not limited to the risks of nonoperative treatment, versus surgical intervention including infection, bleeding, nerve injury,  blood clots, cardiopulmonary complications, morbidity, mortality, among others, and they were willing to proceed.   Talked specifically about just doing the meniscus and if not improved may need eventual treatment of pes bursa.  She didn't want to do the open portion of the procedure.  Hiram Gash, MD  12/04/2017 6:44 AM

## 2017-12-04 NOTE — Anesthesia Procedure Notes (Signed)
Procedure Name: LMA Insertion Date/Time: 12/04/2017 7:30 AM Performed by: Maryella Shivers, CRNA Pre-anesthesia Checklist: Patient identified, Emergency Drugs available, Suction available and Patient being monitored Patient Re-evaluated:Patient Re-evaluated prior to induction Oxygen Delivery Method: Circle system utilized Preoxygenation: Pre-oxygenation with 100% oxygen Induction Type: IV induction Ventilation: Mask ventilation without difficulty LMA: LMA inserted LMA Size: 4.0 Number of attempts: 1 Airway Equipment and Method: Bite block Placement Confirmation: positive ETCO2 Tube secured with: Tape Dental Injury: Teeth and Oropharynx as per pre-operative assessment

## 2017-12-04 NOTE — Anesthesia Postprocedure Evaluation (Signed)
Anesthesia Post Note  Patient: Shelby Guerra  Procedure(s) Performed: KNEE ARTHROSCOPY WITH MEDIAL MENISCECTOMY AND CHONDROPLASTY (Left Knee)     Patient location during evaluation: PACU Anesthesia Type: General Level of consciousness: awake and alert Pain management: pain level controlled Vital Signs Assessment: post-procedure vital signs reviewed and stable Respiratory status: spontaneous breathing, nonlabored ventilation, respiratory function stable and patient connected to nasal cannula oxygen Cardiovascular status: blood pressure returned to baseline and stable Postop Assessment: no apparent nausea or vomiting Anesthetic complications: no    Last Vitals:  Vitals:   12/04/17 0845 12/04/17 0847  BP:  (!) 161/73  Pulse: 82 78  Resp: 18 19  Temp:    SpO2: 100% 100%    Last Pain:  Vitals:   12/04/17 0631  TempSrc: Oral  PainSc: 0-No pain                 Hector Taft

## 2017-12-04 NOTE — Op Note (Signed)
Orthopaedic Surgery Operative Note (CSN: [id number])  Shelby Guerra  February 15, 1956 Date of Surgery: 12/04/2017   Diagnoses:  CHONDROMALACIA PATELLA AND MEDICAL MENISCUS TEAR  Procedure: Medial partial meniscectomy Medial femoral condyle chondroplasty   Operative Finding Successful completion of planned procedure.  Post-operative plan: The patient will be wbat.  The patient will be dc home.  DVT prophylaxis aspirin 81mg  qd.  Pain control with PRN pain medication preferring oral medicines.  Follow up plan will be scheduled in approximately 7 days for incision check.  Post-Op Diagnosis: Same Surgeons:Primary: Hiram Gash, MD Assistants:none Location: [place] OR ROOM 6 Anesthesia: General Antibiotics: Ancef 2g preop Tourniquet time: none Estimated Blood Loss: minimal Complications: None Specimens: None Implants: * No implants in log *  Indications for Surgery:   Shelby Guerra is a 62 y.o. female with pain refractory to nonoperative measures including Visco supplementation injections and anti-inflammatories.  She had mechanical symptoms and MRI demonstrated a displaced posterior medial meniscus tear with some mild arthritis.  Benefits and risks of operative and nonoperative management were discussed prior to surgery with patient/guardian(s) and informed consent form was completed.  Specific risks including infection, need for additional surgery, to need pain in the setting of arthritis, continued pain in her pes bursa.   Procedure:   The patient was identified in the preoperative holding area where the surgical site was marked. The patient was taken to the OR where a procedural timeout was called and the above noted anesthesia was induced.  The patient was positioned prone on a regular bed.  Preoperative antibiotics were dosed.  The patient's left knee was prepped and draped in the usual sterile fashion.  A second preoperative timeout was called.      Exam under anesthesia: Range of  motion full and symmetric to opposite knee, ligamentously stable exam with normal lachman  Suprapatellar pouch: Relatively normal no significant chondromalacia patella  Medial compartment: Small area 2 x 2 grade 3 changes on the medial femoral condyle, scattered grade 2 changes on the plateau.  40% total meniscal volume involved in tear and resected.  Flap posterior medially at the root was displaced posterior to the PCL and extensive debridement was done of this to remove it.  Lateral Compartment: Mild meniscal fraying but no tear, grade 1 and 2 changes in the tibial plateau none of the femur.  Intercondylar Notch: Normal  The patient was identified properly. Informed consent was obtained and the surgical site was marked. The patient was taken up to suite where general anesthesia was induced. The patient was placed in the supine position with a post against the surgical leg and a nonsterile tourniquet applied. The surgical leg was then prepped and draped usual sterile fashion.  A standard surgical timeout was performed.  2 standard anterior portals were made and diagnostic arthroscopy performed. Please note the findings as noted above.  Gentle chondroplasty was performed of the medial femoral condyle using a shaver.  We then turned our attention the meniscectomy which was performed with a biting device as well as a shaver back to a stable base.  40% total meniscal volume was removed as it was involved in the tear.  Meniscal flap posterior medial was extensively debrided as it was unstable and flipping into the joint likely.  Incisions closed with absorbable suture. The patient was awoken from general anesthesia and taken to the PACU in stable condition without complication.

## 2017-12-04 NOTE — Transfer of Care (Signed)
Immediate Anesthesia Transfer of Care Note  Patient: Shelby Guerra  Procedure(s) Performed: KNEE ARTHROSCOPY WITH MEDIAL MENISCECTOMY AND CHONDROPLASTY (Left Knee)  Patient Location: PACU  Anesthesia Type:General  Level of Consciousness: sedated  Airway & Oxygen Therapy: Patient Spontanous Breathing and Patient connected to face mask oxygen  Post-op Assessment: Report given to RN and Post -op Vital signs reviewed and stable  Post vital signs: Reviewed and stable  Last Vitals:  Vitals Value Taken Time  BP    Temp    Pulse 78 12/04/2017  8:22 AM  Resp    SpO2 100 % 12/04/2017  8:22 AM  Vitals shown include unvalidated device data.  Last Pain:  Vitals:   12/04/17 0631  TempSrc: Oral  PainSc: 0-No pain      Patients Stated Pain Goal: 0 (00/45/99 7741)  Complications: No apparent anesthesia complications

## 2017-12-04 NOTE — Discharge Instructions (Signed)

## 2017-12-05 ENCOUNTER — Encounter (HOSPITAL_BASED_OUTPATIENT_CLINIC_OR_DEPARTMENT_OTHER): Payer: Self-pay | Admitting: Orthopaedic Surgery

## 2017-12-10 ENCOUNTER — Encounter (HOSPITAL_COMMUNITY): Payer: Self-pay

## 2017-12-10 ENCOUNTER — Ambulatory Visit (HOSPITAL_COMMUNITY)
Admission: RE | Admit: 2017-12-10 | Discharge: 2017-12-10 | Disposition: A | Discharge status: Home / Self Care | Payer: BLUE CROSS/BLUE SHIELD | Source: Ambulatory Visit | Attending: Adult Health | Admitting: Adult Health

## 2017-12-10 DIAGNOSIS — Z1231 Encounter for screening mammogram for malignant neoplasm of breast: Secondary | ICD-10-CM | POA: Diagnosis not present

## 2017-12-16 DIAGNOSIS — S83242D Other tear of medial meniscus, current injury, left knee, subsequent encounter: Secondary | ICD-10-CM | POA: Diagnosis not present

## 2017-12-23 DIAGNOSIS — S83242D Other tear of medial meniscus, current injury, left knee, subsequent encounter: Secondary | ICD-10-CM | POA: Diagnosis not present

## 2018-02-13 DIAGNOSIS — L299 Pruritus, unspecified: Secondary | ICD-10-CM | POA: Diagnosis not present

## 2018-02-13 DIAGNOSIS — N3000 Acute cystitis without hematuria: Secondary | ICD-10-CM | POA: Diagnosis not present

## 2018-02-13 DIAGNOSIS — Z6833 Body mass index (BMI) 33.0-33.9, adult: Secondary | ICD-10-CM | POA: Diagnosis not present

## 2018-02-18 DIAGNOSIS — H6691 Otitis media, unspecified, right ear: Secondary | ICD-10-CM | POA: Diagnosis not present

## 2018-02-18 DIAGNOSIS — R110 Nausea: Secondary | ICD-10-CM | POA: Diagnosis not present

## 2018-02-18 DIAGNOSIS — J019 Acute sinusitis, unspecified: Secondary | ICD-10-CM | POA: Diagnosis not present

## 2018-02-18 DIAGNOSIS — Z6834 Body mass index (BMI) 34.0-34.9, adult: Secondary | ICD-10-CM | POA: Diagnosis not present

## 2018-02-24 DIAGNOSIS — Z6833 Body mass index (BMI) 33.0-33.9, adult: Secondary | ICD-10-CM | POA: Diagnosis not present

## 2018-02-24 DIAGNOSIS — Z0000 Encounter for general adult medical examination without abnormal findings: Secondary | ICD-10-CM | POA: Diagnosis not present

## 2018-05-05 ENCOUNTER — Ambulatory Visit (INDEPENDENT_AMBULATORY_CARE_PROVIDER_SITE_OTHER): Payer: BLUE CROSS/BLUE SHIELD | Admitting: Adult Health

## 2018-05-05 ENCOUNTER — Other Ambulatory Visit: Payer: Self-pay

## 2018-05-05 ENCOUNTER — Encounter: Payer: Self-pay | Admitting: Adult Health

## 2018-05-05 VITALS — BP 145/77 | HR 58 | Ht 62.0 in | Wt 181.0 lb

## 2018-05-05 DIAGNOSIS — Z01419 Encounter for gynecological examination (general) (routine) without abnormal findings: Secondary | ICD-10-CM | POA: Diagnosis not present

## 2018-05-05 DIAGNOSIS — Z1211 Encounter for screening for malignant neoplasm of colon: Secondary | ICD-10-CM

## 2018-05-05 DIAGNOSIS — Z1212 Encounter for screening for malignant neoplasm of rectum: Secondary | ICD-10-CM | POA: Diagnosis not present

## 2018-05-05 LAB — HEMOCCULT GUIAC POC 1CARD (OFFICE): Fecal Occult Blood, POC: NEGATIVE

## 2018-05-05 MED ORDER — OMEPRAZOLE 20 MG PO CPDR: DELAYED_RELEASE_CAPSULE | ORAL | 3 refills | Status: DC

## 2018-05-05 MED ORDER — PROMETHAZINE HCL 25 MG PO TABS: 25.0000 mg | ORAL_TABLET | Freq: Four times a day (QID) | ORAL | 1 refills | Status: DC | PRN

## 2018-05-05 MED ORDER — VENLAFAXINE HCL ER 75 MG PO CP24: 75.0000 mg | ORAL_CAPSULE | Freq: Every day | ORAL | 12 refills | Status: DC

## 2018-05-05 NOTE — Progress Notes (Signed)
Patient ID: Shelby Guerra, female   DOB: 07-Mar-1956, 62 y.o.   MRN: [id number] History of Present Illness: Shelby Guerra is a 62 year old black female, sinlge, sp hysterectomy in for well woman gyn exam. PCP is [facility].    Current Medications, Allergies, Past Medical History, Past Surgical History, Family History and Social History were reviewed in [employer] record.     Review of Systems: Patient denies any headaches, hearing loss, fatigue, blurred vision, shortness of breath, chest pain, abdominal pain, problems with bowel movements, urination, or intercourse(not having sex). No joint pain or mood swings. +nausea at times Occasional heartburn +hot flashes, was off effexor      Physical Exam:BP (!) 145/77 (BP Location: Right Arm, Patient Position: Sitting, Cuff Size: Normal)   Pulse (!) 58   Ht 5\' 2"  (1.575 m)   Wt 181 lb (82.1 kg)   BMI 33.11 kg/m  General:  Well developed, well nourished, no acute distress Skin:  Warm and dry Neck:  Midline trachea, normal thyroid, good ROM, no lymphadenopathy,no carotid bruits heard  Lungs; Clear to auscultation bilaterally Breast:  No dominant palpable mass, retraction, or nipple discharge,left nipple inverted,chronic Cardiovascular: Regular rate and rhythm Abdomen:  Soft, non tender, no hepatosplenomegaly Pelvic:  External genitalia is normal in appearance, no lesions.  The vagina is normal in appearance. Urethra has no lesions or masses. The cervix and uterus are absent.  No adnexal masses or tenderness noted.Bladder is non tender, no masses felt. Rectal: Good sphincter tone, no polyps, or hemorrhoids felt.  Hemoccult negative. Extremities/musculoskeletal:  No swelling or varicosities noted, no clubbing or cyanosis Psych:  No mood changes, alert and cooperative,seems happy PHQ 2 score 0. Fall risk low. Examination chaperoned by Shela Nevin RN.  Impression: 1. Well female exam with routine gynecological exam   2.  Screening for colorectal cancer       Plan: Meds ordered this encounter  Medications  . omeprazole (PRILOSEC) 20 MG capsule    Sig: Take 1 daily    Dispense:  30 capsule    Refill:  3    Order Specific Question:   Supervising Provider    Answer:   EURE, LUTHER H [[id number]]  . promethazine (PHENERGAN) 25 MG tablet    Sig: Take 1 tablet (25 mg total) by mouth every 6 (six) hours as needed for nausea or vomiting.    Dispense:  30 tablet    Refill:  1    Order Specific Question:   Supervising Provider    Answer:   Elonda Husky, LUTHER H [[id number]]  . venlafaxine XR (EFFEXOR-XR) 75 MG 24 hr capsule    Sig: Take 1 capsule (75 mg total) by mouth daily with breakfast.    Dispense:  30 capsule    Refill:  12    Order Specific Question:   Supervising Provider    Answer:   Florian Buff [[id number]]  Mammogram yearly Physical in 1 year Labs with PCP Colonoscopy per GI July 2020

## 2018-05-09 ENCOUNTER — Other Ambulatory Visit: Payer: Self-pay

## 2018-05-09 ENCOUNTER — Emergency Department (HOSPITAL_COMMUNITY)
Admission: EM | Admit: 2018-05-09 | Discharge: 2018-05-09 | Disposition: A | Discharge status: Home / Self Care | Payer: BLUE CROSS/BLUE SHIELD | Attending: Emergency Medicine | Admitting: Emergency Medicine

## 2018-05-09 DIAGNOSIS — R350 Frequency of micturition: Secondary | ICD-10-CM | POA: Diagnosis not present

## 2018-05-09 DIAGNOSIS — Z79899 Other long term (current) drug therapy: Secondary | ICD-10-CM | POA: Insufficient documentation

## 2018-05-09 DIAGNOSIS — I10 Essential (primary) hypertension: Secondary | ICD-10-CM | POA: Insufficient documentation

## 2018-05-09 DIAGNOSIS — N390 Urinary tract infection, site not specified: Secondary | ICD-10-CM | POA: Diagnosis not present

## 2018-05-09 LAB — URINALYSIS, ROUTINE W REFLEX MICROSCOPIC
BACTERIA UA: NONE SEEN
Bilirubin Urine: NEGATIVE
Glucose, UA: NEGATIVE mg/dL
Ketones, ur: NEGATIVE mg/dL
Nitrite: POSITIVE — AB
PROTEIN: NEGATIVE mg/dL
Specific Gravity, Urine: 1.014 (ref 1.005–1.030)
pH: 5 (ref 5.0–8.0)

## 2018-05-09 MED ORDER — CEPHALEXIN 500 MG PO CAPS: 500.0000 mg | ORAL_CAPSULE | Freq: Two times a day (BID) | ORAL | 0 refills | Status: AC

## 2018-05-09 NOTE — ED Triage Notes (Signed)
Patient c/o pelvic pressure with urinary frequency with little output. Patient reports taking AZO with no relief. Denies any fevers, nausea, vomiting, or diarrhea. Patient states symptoms started Tuesday and she was seen by Criss Alvine PA, urine checked but was not told it showed infections. Symptoms worse last night.

## 2018-05-09 NOTE — ED Provider Notes (Signed)
[facility] EMERGENCY DEPARTMENT Provider Note   CSN: [id number] Arrival date & time: 05/09/18  4332     History   Chief Complaint Chief Complaint  Patient presents with  . Urinary Frequency    HPI Shelby Guerra is a 62 y.o. female presents today for chief complaint of urinary frequency.  Patient states that over the past 3-4 days she has developed increase in urinary frequency which is consistent with her normal urinary tract infection symptoms.  Patient denies dysuria or hematuria with her symptoms.  She states that she is otherwise feeling well aside from the urinary frequency, she does endorse a mild lower abdominal discomfort that she also attributes to her urinary tract infection.  Patient describes this discomfort as a dull feeling, mild intensity, only present with urination and improved with rest.  Patient denies fever, chills, nausea/vomiting, diarrhea, chest pain/shortness of breath or any other concerns at this time.  She states that she took 1 AZO this morning without relief just prior to arrival.  Of note patient was seen by her OB/GYN on 05/05/2018, had pelvic exam and urinalysis performed without abnormality per patient. HPI  Past Medical History:  Diagnosis Date  . GERD (gastroesophageal reflux disease)   . High cholesterol   . Hot flashes 03/10/2013  . Hypertension    pt denies  . Impaired fasting glucose   . Kidney stone    oxalate/uric acid  . Moody 03/14/2014    Patient Active Problem List   Diagnosis Date Noted  . Screening for colorectal cancer 05/05/2018  . Left knee pain 05/22/2017  . Well female exam with routine gynecological exam 05/22/2017  . Depression 02/01/2015  . Moody 03/14/2014  . Hot flashes 03/10/2013  . GERD 11/29/2009  . CONSTIPATION, CHRONIC 11/29/2009  . NAUSEA, CHRONIC 11/29/2009    Past Surgical History:  Procedure Laterality Date  . COLONOSCOPY  12/13/2008  . INGUINAL HERNIA REPAIR Left 02/26/2006   recurrent  .  INGUINAL HERNIA REPAIR Left    prior to 2007  . KNEE ARTHROSCOPY WITH MEDIAL MENISECTOMY Left 12/04/2017   Procedure: KNEE ARTHROSCOPY WITH MEDIAL MENISCECTOMY AND CHONDROPLASTY;  Surgeon: Hiram Gash, MD;  Location: [facility];  Service: Orthopedics;  Laterality: Left;  NO BLOCK  . TOTAL ABDOMINAL HYSTERECTOMY W/ BILATERAL SALPINGOOPHORECTOMY  10/15/2006  . TUBAL LIGATION       OB History    Gravida  1   Para  1   Term      Preterm      AB      Living  1     SAB      TAB      Ectopic      Multiple      Live Births  1            Home Medications    Prior to Admission medications   Medication Sig Start Date End Date Taking? Authorizing Provider  cephALEXin (KEFLEX) 500 MG capsule Take 1 capsule (500 mg total) by mouth 2 (two) times daily for 7 days. 05/09/18 05/16/18  Nuala Alpha A, PA-C  Multiple Vitamin (MULTIVITAMIN) tablet Take 1 tablet by mouth daily.    [provider]  omeprazole (PRILOSEC) 20 MG capsule Take 1 daily 05/05/18   Estill Dooms, NP  promethazine (PHENERGAN) 25 MG tablet Take 1 tablet (25 mg total) by mouth every 6 (six) hours as needed for nausea or vomiting. 05/05/18   Estill Dooms, NP  venlafaxine XR (EFFEXOR-XR) 75 MG 24 hr capsule Take 1 capsule (75 mg total) by mouth daily with breakfast. 05/05/18   Estill Dooms, NP    Family History Family History  Problem Relation Age of Onset  . Hypertension Brother   . Kidney failure Brother        had kidney transplant  . Hyperlipidemia Brother   . Hypertension Sister   . Hyperlipidemia Sister   . Cancer Sister        cervical  . Hypertension Brother   . Kidney failure Father   . Cancer Mother   . Colon cancer Neg Hx     Social History Social History   Tobacco Use  . Smoking status: Never Smoker  . Smokeless tobacco: Never Used  Substance Use Topics  . Alcohol use: No  . Drug use: No     Allergies   Codeine and Propoxyphene  n-acetaminophen   Review of Systems Review of Systems  Constitutional: Negative.  Negative for chills and fever.  HENT: Negative.  Negative for congestion, rhinorrhea and sore throat.   Eyes: Negative.  Negative for visual disturbance.  Respiratory: Negative.  Negative for cough and shortness of breath.   Cardiovascular: Negative.  Negative for chest pain and leg swelling.  Gastrointestinal: Negative.  Negative for abdominal pain, blood in stool, diarrhea, nausea and vomiting.  Genitourinary: Positive for frequency. Negative for difficulty urinating, dysuria, hematuria, vaginal bleeding and vaginal discharge.  Musculoskeletal: Negative.  Negative for arthralgias and myalgias.  Skin: Negative.  Negative for rash.  Neurological: Negative.  Negative for dizziness, syncope, weakness, light-headedness and headaches.   Physical Exam Updated Vital Signs BP (!) 158/80 (BP Location: Right Arm)   Pulse (!) 58   Temp 97.9 F (36.6 C) (Oral)   Resp 18   Ht 5\' 2"  (1.575 m)   Wt 82.1 kg   SpO2 99%   BMI 33.11 kg/m   Physical Exam  Constitutional: She appears well-developed and well-nourished. No distress.  HENT:  Head: Normocephalic and atraumatic.  Right Ear: External ear normal.  Left Ear: External ear normal.  Nose: Nose normal.  Eyes: Pupils are equal, round, and reactive to light. EOM are normal.  Neck: Trachea normal, normal range of motion, full passive range of motion without pain and phonation normal. Neck supple. No tracheal deviation present.  Cardiovascular: Normal rate, regular rhythm, normal heart sounds and intact distal pulses.  Pulses:      Dorsalis pedis pulses are 2+ on the right side, and 2+ on the left side.       Posterior tibial pulses are 2+ on the right side, and 2+ on the left side.  Pulmonary/Chest: Effort normal and breath sounds normal. No respiratory distress. She has no decreased breath sounds. She has no rhonchi. She exhibits no tenderness, no crepitus and  no deformity.  Abdominal: Soft. There is tenderness (Mild suprapubic tenderness) in the suprapubic area. There is no rigidity, no rebound, no guarding and no CVA tenderness.    Genitourinary:  Genitourinary Comments: Deferred by patient  Musculoskeletal: Normal range of motion.       Right lower leg: Normal.       Left lower leg: Normal.  Feet:  Right Foot:  Protective Sensation: 3 sites tested. 3 sites sensed.  Left Foot:  Protective Sensation: 3 sites tested. 3 sites sensed.  Neurological: She is alert. GCS eye subscore is 4. GCS verbal subscore is 5. GCS motor subscore is 6.  Speech is clear  and goal oriented, follows commands Major Cranial nerves without deficit, no facial droop Normal strength in upper and lower extremities bilaterally including dorsiflexion and plantar flexion, strong and equal grip strength Sensation normal to light touch Moves extremities without ataxia, coordination intact Normal gait  Skin: Skin is warm and dry.  Psychiatric: She has a normal mood and affect. Her behavior is normal.   ED Treatments / Results  Labs (all labs ordered are listed, but only abnormal results are displayed) Labs Reviewed  URINALYSIS, ROUTINE W REFLEX MICROSCOPIC - Abnormal; Notable for the following components:      Result Value   Color, Urine AMBER (*)    Hgb urine dipstick SMALL (*)    Nitrite POSITIVE (*)    Leukocytes, UA MODERATE (*)    WBC, UA >50 (*)    All other components within normal limits  URINE CULTURE    EKG None  Radiology No results found.  Procedures Procedures (including critical care time)  Medications Ordered in ED Medications - No data to display   Initial Impression / Assessment and Plan / ED Course  I have reviewed the triage vital signs and the nursing notes.  Pertinent labs & imaging results that were available during my care of the patient were reviewed by me and considered in my medical decision making (see chart for details).      62 year old female presenting today for history and symptoms consistent with urinary tract infection.  Urinalysis today shows leukocytes, nitrites and bacteria.  Patient has been diagnosed with a urinary tract infection. Patient is afebrile, no CVA tenderness, normotensive and denies nausea/vomiting.  Patient denies recent antibiotic use.  Patient denies history of CKD, has been prescribed Keflex twice daily for her UTI.  Patient is being discharged with antibiotics and instructions to follow-up with primary-care provider on Monday.  Patient informed to return to the emergency department if symptoms have not improved in 48 hours.  Patient informed to the emergency department sooner for new or worsening symptoms.  Urine culture sent.  At discharge patient is afebrile, not tachycardic, not hypotensive, well-appearing and in no acute distress.  Patient does not meet sepsis or SIRS criteria.  At this time there does not appear to be any evidence of an acute emergency medical condition and the patient appears stable for discharge with appropriate outpatient follow up. Diagnosis was discussed with patient who verbalizes understanding of care plan and is agreeable to discharge. I have discussed return precautions with patient who verbalizes understanding of return precautions. Patient strongly encouraged to follow-up with their PCP on Monday and return if not improved in 48 hours or sooner if new/worsening symptoms. All questions answered.  Patient's case has been discussed with Dr. Roderic Palau who agrees with discharge at this time, antibiotics and PCP follow-up.  Note: Portions of this report may have been transcribed using voice recognition software. Every effort was made to ensure accuracy; however, inadvertent computerized transcription errors may still be present. Final Clinical Impressions(s) / ED Diagnoses   Final diagnoses:  Lower urinary tract infectious disease    ED Discharge Orders         Ordered     cephALEXin (KEFLEX) 500 MG capsule  2 times daily     05/09/18 [address] 05/09/18 1056    Milton Ferguson, MD 05/10/18 [phone]

## 2018-05-09 NOTE — Discharge Instructions (Signed)
You have been diagnosed today with lower urinary tract infection.  At this time there does not appear to be the presence of an emergent medical condition, however there is always the potential for conditions to change. Please read and follow the below instructions.  Please return to the Emergency Department immediately for any new or worsening symptoms. Please be sure to follow up with your Primary Care Provider on Monday regarding your visit today; please call their office to schedule an appointment even if you are feeling better for a follow-up visit. Please take the antibiotic Keflex as prescribed. Return to the emergency department if your symptoms do not improve in the next 48 hours (2 days).  Return sooner for any new or worsening symptoms. Your urine has been sent for culture to grow out bacteria.  You will be contacted by the hospital if it is necessary to change antibiotics based on the culture.  Get help right away if: You have severe back pain or lower abdominal pain. You are vomiting and cannot keep down any medicines or water. You have back pain. You have a fever. You feel nauseous or vomit. Your symptoms go away and then return.  Please read the additional information packets attached to your discharge summary.  Do not take your medicine if  develop an itchy rash, swelling in your mouth or lips, or difficulty breathing.

## 2018-05-12 LAB — URINE CULTURE: Culture: >=100000 — AB

## 2018-05-13 ENCOUNTER — Telehealth: Payer: Self-pay | Admitting: Emergency Medicine

## 2018-05-13 NOTE — Telephone Encounter (Signed)
Post ED Visit - Positive Culture Follow-up  Culture report reviewed by antimicrobial stewardship pharmacist:  []  Elenor Quinones, Pharm.D. []  Heide Guile, Pharm.D., BCPS AQ-ID []  Parks Neptune, Pharm.D., BCPS []  Alycia Rossetti, Pharm.D., BCPS []  [place], [place].D., BCPS, AAHIVP []  Legrand Como, Pharm.D., BCPS, AAHIVP []  Salome Arnt, PharmD, BCPS []  Johnnette Gourd, PharmD, BCPS [x]  Hughes Better, PharmD, BCPS []  Leeroy Cha, PharmD  Positive urine culture Treated with Cephalexin, organism sensitive to the same and no further patient follow-up is required at this time.  Shelby Guerra 05/13/2018, 12:36 PM

## 2018-08-12 DIAGNOSIS — R1110 Vomiting, unspecified: Secondary | ICD-10-CM | POA: Diagnosis not present

## 2018-08-12 DIAGNOSIS — R197 Diarrhea, unspecified: Secondary | ICD-10-CM | POA: Diagnosis not present

## 2018-08-12 DIAGNOSIS — Z6831 Body mass index (BMI) 31.0-31.9, adult: Secondary | ICD-10-CM | POA: Diagnosis not present

## 2018-10-13 DIAGNOSIS — L309 Dermatitis, unspecified: Secondary | ICD-10-CM | POA: Diagnosis not present

## 2018-11-11 DIAGNOSIS — N3000 Acute cystitis without hematuria: Secondary | ICD-10-CM | POA: Diagnosis not present

## 2018-11-11 DIAGNOSIS — Z6832 Body mass index (BMI) 32.0-32.9, adult: Secondary | ICD-10-CM | POA: Diagnosis not present

## 2018-12-01 ENCOUNTER — Encounter: Payer: Self-pay | Admitting: Internal Medicine

## 2018-12-03 ENCOUNTER — Other Ambulatory Visit (HOSPITAL_COMMUNITY): Payer: Self-pay | Admitting: Adult Health

## 2018-12-03 DIAGNOSIS — Z1231 Encounter for screening mammogram for malignant neoplasm of breast: Secondary | ICD-10-CM

## 2018-12-21 ENCOUNTER — Other Ambulatory Visit: Payer: Self-pay

## 2018-12-21 ENCOUNTER — Ambulatory Visit (HOSPITAL_COMMUNITY)
Admission: RE | Admit: 2018-12-21 | Discharge: 2018-12-21 | Disposition: A | Discharge status: Home / Self Care | Payer: BC Managed Care – PPO | Source: Ambulatory Visit | Attending: Adult Health | Admitting: Adult Health

## 2018-12-21 DIAGNOSIS — Z1231 Encounter for screening mammogram for malignant neoplasm of breast: Secondary | ICD-10-CM | POA: Diagnosis not present

## 2019-01-04 ENCOUNTER — Other Ambulatory Visit: Payer: Self-pay

## 2019-01-04 ENCOUNTER — Ambulatory Visit (INDEPENDENT_AMBULATORY_CARE_PROVIDER_SITE_OTHER): Payer: BC Managed Care – PPO | Admitting: *Deleted

## 2019-01-04 DIAGNOSIS — Z1211 Encounter for screening for malignant neoplasm of colon: Secondary | ICD-10-CM

## 2019-01-04 MED ORDER — PEG 3350-KCL-NA BICARB-NACL 420 G PO SOLR: 4000.0000 mL | Freq: Once | ORAL | 0 refills | Status: AC

## 2019-01-04 NOTE — Patient Instructions (Signed)
Shelby Guerra   1955-09-02 MRN: [id number]    Procedure Date: 03/05/2019 Time to register: 9:30 am Place to register: [facility] Procedure Time: 10:30 am Scheduled provider: Dr. Gala Romney  PREPARATION FOR COLONOSCOPY WITH TRI-LYTE SPLIT PREP  Please notify us immediately if you are diabetic, take iron supplements, or if you are on Coumadin or any other blood thinners.   Please hold the following medications: n/a  You will need to purchase 1 fleet enema and 1 box of Bisacodyl '5mg'$  tablets.   2 DAYS BEFORE PROCEDURE:  DATE: 03/03/2019   DAY: Wednesday Begin clear liquid diet AFTER your lunch meal. NO SOLID FOODS after this point.  1 DAY BEFORE PROCEDURE:  DATE: 03/04/2019   DAY: Thursday Continue clear liquids the entire day - NO SOLID FOOD.   Diabetic medications adjustments for today: n/a  At 2:00 pm:  Take 2 Bisacodyl tablets.   At 4:00pm:  Start drinking your solution. Make sure you mix well per instructions on the bottle. Try to drink 1 (one) 8 ounce glass every 10-15 minutes until you have consumed HALF the jug. You should complete by 6:00pm.You must keep the left over solution refrigerated until completed next day.  Continue clear liquids. You must drink plenty of clear liquids to prevent dehyration and kidney failure.     DAY OF PROCEDURE:   DATE: 03/05/2019   DAY: Friday If you take medications for your heart, blood pressure or breathing, you may take these medications.  Diabetic medications adjustments for today: n/a  Five hours before your procedure time @ 5:30am:  Finish remaining amout of bowel prep, drinking 1 (one) 8 ounce glass every 10-15 minutes until complete. You have two hours to consume remaining prep.   Three hours before your procedure time '@7'$ :30am:  Nothing by mouth.   At least one hour before going to the hospital:  Give yourself one Fleet enema. You may take your morning medications with sip of water unless we have instructed otherwise.       Please see below for Dietary Information.  CLEAR LIQUIDS INCLUDE:  Water Jello (NOT red in color)   Ice Popsicles (NOT red in color)   Tea (sugar ok, no milk/cream) Powdered fruit flavored drinks  Coffee (sugar ok, no milk/cream) Gatorade/ Lemonade/ Kool-Aid  (NOT red in color)   Juice: apple, white grape, white cranberry Soft drinks  Clear bullion, consomme, broth (fat free beef/chicken/vegetable)  Carbonated beverages (any kind)  Strained chicken noodle soup Hard Candy   Remember: Clear liquids are liquids that will allow you to see your fingers on the other side of a clear glass. Be sure liquids are NOT red in color, and not cloudy, but CLEAR.  DO NOT EAT OR DRINK ANY OF THE FOLLOWING:  Dairy products of any kind   Cranberry juice Tomato juice / V8 juice   Grapefruit juice Orange juice     Red grape juice  Do not eat any solid foods, including such foods as: cereal, oatmeal, yogurt, fruits, vegetables, creamed soups, eggs, bread, crackers, pureed foods in a blender, etc.   HELPFUL HINTS FOR DRINKING PREP SOLUTION:   Make sure prep is extremely cold. Mix and refrigerate the the morning of the prep. You may also put in the freezer.   You may try mixing some Crystal Light or Country Time Lemonade if you prefer. Mix in small amounts; add more if necessary.  Try drinking through a straw  Rinse mouth with water or a mouthwash  between glasses, to remove after-taste.  Try sipping on a cold beverage /ice/ popsicles between glasses of prep.  Place a piece of sugar-free hard candy in mouth between glasses.  If you become nauseated, try consuming smaller amounts, or stretch out the time between glasses. Stop for 30-60 minutes, then slowly start back drinking.        OTHER INSTRUCTIONS  You will need a responsible adult at least 62 years of age to accompany you and drive you home. This person must remain in the waiting room during your procedure. The hospital will cancel  your procedure if you do not have a responsible adult with you.   1. Wear loose fitting clothing that is easily removed. 2. Leave jewelry and other valuables at home.  3. Remove all body piercing jewelry and leave at home. 4. Total time from sign-in until discharge is approximately 2-3 hours. 5. You should go home directly after your procedure and rest. You can resume normal activities the day after your procedure. 6. The day of your procedure you should not:  Drive  Make legal decisions  Operate machinery  Drink alcohol  Return to work   You may call the office (Dept: [phone]) before 5:00pm, or page the doctor on call [phone]) after 5:00pm, for further instructions, if necessary.   Insurance Information YOU WILL NEED TO CHECK WITH YOUR INSURANCE COMPANY FOR THE BENEFITS OF COVERAGE YOU HAVE FOR THIS PROCEDURE.  UNFORTUNATELY, NOT ALL INSURANCE COMPANIES HAVE BENEFITS TO COVER ALL OR PART OF THESE TYPES OF PROCEDURES.  IT IS YOUR RESPONSIBILITY TO CHECK YOUR BENEFITS, HOWEVER, WE WILL BE GLAD TO ASSIST YOU WITH ANY CODES YOUR INSURANCE COMPANY MAY NEED.    PLEASE NOTE THAT MOST INSURANCE COMPANIES WILL NOT COVER A SCREENING COLONOSCOPY FOR PEOPLE UNDER THE AGE OF 50  IF YOU HAVE BCBS INSURANCE, YOU MAY HAVE BENEFITS FOR A SCREENING COLONOSCOPY BUT IF POLYPS ARE FOUND THE DIAGNOSIS WILL CHANGE AND THEN YOU MAY HAVE A DEDUCTIBLE THAT WILL NEED TO BE MET. SO PLEASE MAKE SURE YOU CHECK YOUR BENEFITS FOR A SCREENING COLONOSCOPY AS WELL AS A DIAGNOSTIC COLONOSCOPY.

## 2019-01-04 NOTE — Progress Notes (Addendum)
Gastroenterology Pre-Procedure Review  Request Date: 01/04/2019 Requesting Physician: 10 year recall, Last TCS 12/13/08 by Dr. Gala Romney, No polyps or findings  PATIENT REVIEW QUESTIONS: The patient responded to the following health history questions as indicated:    1. Diabetes Melitis: No 2. Joint replacements in the past 12 months: No 3. Major health problems in the past 3 months: No 4. Has an artificial valve or MVP: No 5. Has a defibrillator: No 6. Has been advised in past to take antibiotics in advance of a procedure like teeth cleaning: No 7. Family history of colon cancer: No  8. Alcohol Use: No 9. History of sleep apnea: No  10. History of coronary artery or other vascular stents placed within the last 12 months: No 11. History of any prior anesthesia complications: No    MEDICATIONS & ALLERGIES:    Patient reports the following regarding taking any blood thinners:   Plavix? No Aspirin? No Coumadin? No Brilinta? No Xarelto? No Eliquis? No Pradaxa? No Savaysa? No Effient? No  Patient confirms/reports the following medications:  Current Outpatient Medications  Medication Sig Dispense Refill  . Multiple Vitamin (MULTIVITAMIN) tablet Take 1 tablet by mouth daily.     No current facility-administered medications for this visit.     Patient confirms/reports the following allergies:  Allergies  Allergen Reactions  . Codeine Itching    Can take vicodin  . Propoxyphene N-Acetaminophen Nausea Only    Darvocet    No orders of the defined types were placed in this encounter.   AUTHORIZATION INFORMATION Primary Insurance: [place] [employer] [place],  [place] #: [id number],  Group #: [id number] Pre-Cert / Auth required: Not required/File to local BCBS [place]   SCHEDULE INFORMATION: Procedure has been scheduled as follows:  Date: 03/05/2019, Time: 10:30  Location: [facility] with Dr. Gala Romney  This Gastroenterology Pre-Precedure Review Form is being routed to the following provider(s): Roseanne Kaufman,  NP

## 2019-01-06 NOTE — Addendum Note (Signed)
Addended by: Metro Kung on: 01/06/2019 02:21 PM   Modules accepted: Orders, SmartSet

## 2019-01-06 NOTE — Progress Notes (Signed)
Appropriate.

## 2019-01-15 DIAGNOSIS — M79642 Pain in left hand: Secondary | ICD-10-CM | POA: Diagnosis not present

## 2019-01-15 DIAGNOSIS — Z6831 Body mass index (BMI) 31.0-31.9, adult: Secondary | ICD-10-CM | POA: Diagnosis not present

## 2019-01-15 DIAGNOSIS — M25562 Pain in left knee: Secondary | ICD-10-CM | POA: Diagnosis not present

## 2019-01-20 DIAGNOSIS — Z6832 Body mass index (BMI) 32.0-32.9, adult: Secondary | ICD-10-CM | POA: Diagnosis not present

## 2019-01-20 DIAGNOSIS — K5900 Constipation, unspecified: Secondary | ICD-10-CM | POA: Diagnosis not present

## 2019-01-20 DIAGNOSIS — R0981 Nasal congestion: Secondary | ICD-10-CM | POA: Diagnosis not present

## 2019-02-19 ENCOUNTER — Emergency Department (HOSPITAL_COMMUNITY)
Admission: EM | Admit: 2019-02-19 | Discharge: 2019-02-19 | Disposition: A | Discharge status: Home / Self Care | Payer: BC Managed Care – PPO | Attending: Emergency Medicine | Admitting: Emergency Medicine

## 2019-02-19 ENCOUNTER — Other Ambulatory Visit: Payer: Self-pay

## 2019-02-19 ENCOUNTER — Emergency Department (HOSPITAL_COMMUNITY): Payer: BC Managed Care – PPO

## 2019-02-19 ENCOUNTER — Encounter (HOSPITAL_COMMUNITY): Payer: Self-pay | Admitting: Emergency Medicine

## 2019-02-19 DIAGNOSIS — Z79899 Other long term (current) drug therapy: Secondary | ICD-10-CM | POA: Insufficient documentation

## 2019-02-19 DIAGNOSIS — I10 Essential (primary) hypertension: Secondary | ICD-10-CM | POA: Insufficient documentation

## 2019-02-19 DIAGNOSIS — M79641 Pain in right hand: Secondary | ICD-10-CM | POA: Insufficient documentation

## 2019-02-19 DIAGNOSIS — M79644 Pain in right finger(s): Secondary | ICD-10-CM | POA: Diagnosis not present

## 2019-02-19 NOTE — Discharge Instructions (Signed)
Your xray was negative for any breaks today Continue taking Tylenol as needed for the pain Please follow up with your PCP or Dr. Mardelle Matte with orthopedics regarding your continued pain for 2 months

## 2019-02-19 NOTE — ED Triage Notes (Signed)
Patient complains of right thumb pain x 2 months. States she was being seen at her PCP's office but their xray is down.

## 2019-02-19 NOTE — ED Provider Notes (Signed)
[facility] EMERGENCY DEPARTMENT Provider Note   CSN: [id number] Arrival date & time: 02/19/19  [id number]     History   Chief Complaint Chief Complaint  Patient presents with  . Hand Pain    HPI Shelby Guerra is a 63 y.o. female who presents to the ED today planing of sudden onset, constant, unchanged, right thumb pain x2 months.  Patient states that she slammed her thumb into a car door 2 months ago and has been having pain since then.  She states she saw her PCP about a month and a half ago the x-ray machine was down and she never returned.  She states that they prescribed her prednisone as she has an allergy to codeine but it did not resolve her symptoms.  Patient works quite frequently with her hands and states the pain is exacerbated with movement.  She is right-hand dominant.  Denies fever, chills, redness, drainage, paresthesias, any other associated symptoms.       Past Medical History:  Diagnosis Date  . GERD (gastroesophageal reflux disease)   . High cholesterol   . Hot flashes 03/10/2013  . Hypertension    pt denies  . Impaired fasting glucose   . Kidney stone    oxalate/uric acid  . Moody 03/14/2014    Patient Active Problem List   Diagnosis Date Noted  . Screening for colorectal cancer 05/05/2018  . Left knee pain 05/22/2017  . Well female exam with routine gynecological exam 05/22/2017  . Depression 02/01/2015  . Moody 03/14/2014  . Hot flashes 03/10/2013  . GERD 11/29/2009  . CONSTIPATION, CHRONIC 11/29/2009  . NAUSEA, CHRONIC 11/29/2009    Past Surgical History:  Procedure Laterality Date  . COLONOSCOPY  12/13/2008  . INGUINAL HERNIA REPAIR Left 02/26/2006   recurrent  . INGUINAL HERNIA REPAIR Left    prior to 2007  . KNEE ARTHROSCOPY WITH MEDIAL MENISECTOMY Left 12/04/2017   Procedure: KNEE ARTHROSCOPY WITH MEDIAL MENISCECTOMY AND CHONDROPLASTY;  Surgeon: Hiram Gash, MD;  Location: [facility];  Service: Orthopedics;  Laterality:  Left;  NO BLOCK  . TOTAL ABDOMINAL HYSTERECTOMY W/ BILATERAL SALPINGOOPHORECTOMY  10/15/2006  . TUBAL LIGATION       OB History    Gravida  1   Para  1   Term      Preterm      AB      Living  1     SAB      TAB      Ectopic      Multiple      Live Births  1            Home Medications    Prior to Admission medications   Medication Sig Start Date End Date Taking? Authorizing Provider  Multiple Vitamin (MULTIVITAMIN) tablet Take 1 tablet by mouth daily.   Yes [provider]    Family History Family History  Problem Relation Age of Onset  . Hypertension Brother   . Kidney failure Brother        had kidney transplant  . Hyperlipidemia Brother   . Hypertension Sister   . Hyperlipidemia Sister   . Cancer Sister        cervical  . Hypertension Brother   . Kidney failure Father   . Cancer Mother   . Colon cancer Neg Hx     Social History Social History   Tobacco Use  . Smoking status: Never Smoker  . Smokeless tobacco:  Never Used  Substance Use Topics  . Alcohol use: No  . Drug use: No     Allergies   Codeine and Propoxyphene n-acetaminophen   Review of Systems Review of Systems  Constitutional: Negative for chills and fever.  Musculoskeletal: Positive for arthralgias.  Skin: Negative for wound.  Neurological: Negative for weakness and numbness.     Physical Exam Updated Vital Signs BP (!) 157/77 (BP Location: Left Arm)   Pulse 60   Temp 97.7 F (36.5 C) (Oral)   Resp 16   Ht 5\' 2"  (1.575 m)   Wt 81.6 kg   SpO2 100%   BMI 32.92 kg/m   Physical Exam Vitals signs and nursing note reviewed.  Constitutional:      Appearance: She is not ill-appearing.  HENT:     Head: Normocephalic and atraumatic.  Eyes:     Conjunctiva/sclera: Conjunctivae normal.  Cardiovascular:     Rate and Rhythm: Normal rate and regular rhythm.  Pulmonary:     Effort: Pulmonary effort is normal.     Breath sounds: Normal breath sounds.   Musculoskeletal:     Comments: No obvious edema, erythema, ecchymosis to right thumb.  Tenderness to palpation along first metatarsal as well as MCP joint.  No tenderness to wrist.  No tenderness to anatomical snuffbox.  Range of motion slightly limited with flexion of the finger.  Able to extend digit without issue.  No tenderness to other aspects of the right hand including the second through fifth metatarsals as well as the phalanges.  Refill less than 2 seconds.  Strength and sensation intact throughout.  2+ radial pulse.  Skin:    General: Skin is warm and dry.     Coloration: Skin is not jaundiced.  Neurological:     Mental Status: She is alert.      ED Treatments / Results  Labs (all labs ordered are listed, but only abnormal results are displayed) Labs Reviewed - No data to display  EKG None  Radiology Dg Finger Thumb Right  Result Date: 02/19/2019 CLINICAL DATA:  Continued pain at the base of the right thumb after slamming it in the car door 2 months ago. EXAM: RIGHT THUMB 2+V COMPARISON:  None. FINDINGS: There is no evidence of fracture or dislocation. There is no evidence of arthropathy or other focal bone abnormality. Soft tissues are unremarkable. IMPRESSION: Negative. Electronically Signed   By: Titus Dubin M.D.   On: 02/19/2019 10:58    Procedures Procedures (including critical care time)  Medications Ordered in ED Medications - No data to display   Initial Impression / Assessment and Plan / ED Course  I have reviewed the triage vital signs and the nursing notes.  Pertinent labs & imaging results that were available during my care of the patient were reviewed by me and considered in my medical decision making (see chart for details).    63 year old female planing of right thumb pain status post slamming it in a car door 2 months ago.  Was seen by PCP prescribed prednisone without relief.  Did not have x-rays at that time as the xray machine was down.  She has  been taking well without relief.  She has some tenderness to the finger today.  Will obtain x-ray at this time.  Patient will likely have to follow back up with PCP regarding this pain.   Xray negative at this time.  Patient advised to continue taking Tylenol as needed.  Rice therapy discussed.  She is to follow-up with PCP.  She is also given name of orthopedics in [facility] for further evaluation should she choose to follow-up with them.  Return precautions discussed.  Patient is in agreement with plan at this time and stable for discharge home.   This note was prepared using Dragon voice recognition software and may include unintentional dictation errors due to the inherent limitations of voice recognition software.       Final Clinical Impressions(s) / ED Diagnoses   Final diagnoses:  Right hand pain    ED Discharge Orders    None       Eustaquio Maize, PA-C 02/19/19 [place], [place], DO 02/21/19 [phone]

## 2019-03-03 ENCOUNTER — Other Ambulatory Visit (HOSPITAL_COMMUNITY)
Admission: RE | Admit: 2019-03-03 | Discharge: 2019-03-03 | Disposition: A | Discharge status: Home / Self Care | Payer: BC Managed Care – PPO | Source: Ambulatory Visit | Attending: Internal Medicine | Admitting: Internal Medicine

## 2019-03-03 DIAGNOSIS — Z20828 Contact with and (suspected) exposure to other viral communicable diseases: Secondary | ICD-10-CM | POA: Insufficient documentation

## 2019-03-03 DIAGNOSIS — Z01812 Encounter for preprocedural laboratory examination: Secondary | ICD-10-CM | POA: Diagnosis not present

## 2019-03-03 DIAGNOSIS — K635 Polyp of colon: Secondary | ICD-10-CM | POA: Diagnosis not present

## 2019-03-03 LAB — SARS CORONAVIRUS 2 (TAT 6-24 HRS): SARS Coronavirus 2: NEGATIVE

## 2019-03-05 ENCOUNTER — Encounter (HOSPITAL_COMMUNITY)
Admission: RE | Disposition: A | Discharge status: Home / Self Care | Payer: Self-pay | Source: Home / Self Care | Attending: Internal Medicine

## 2019-03-05 ENCOUNTER — Other Ambulatory Visit: Payer: Self-pay

## 2019-03-05 ENCOUNTER — Ambulatory Visit (HOSPITAL_COMMUNITY)
Admission: RE | Admit: 2019-03-05 | Discharge: 2019-03-05 | Disposition: A | Discharge status: Home / Self Care | Payer: BC Managed Care – PPO | Attending: Internal Medicine | Admitting: Internal Medicine

## 2019-03-05 ENCOUNTER — Encounter (HOSPITAL_COMMUNITY): Payer: Self-pay | Admitting: *Deleted

## 2019-03-05 DIAGNOSIS — D120 Benign neoplasm of cecum: Secondary | ICD-10-CM | POA: Insufficient documentation

## 2019-03-05 DIAGNOSIS — K5730 Diverticulosis of large intestine without perforation or abscess without bleeding: Secondary | ICD-10-CM | POA: Diagnosis not present

## 2019-03-05 DIAGNOSIS — Z885 Allergy status to narcotic agent status: Secondary | ICD-10-CM | POA: Diagnosis not present

## 2019-03-05 DIAGNOSIS — K635 Polyp of colon: Secondary | ICD-10-CM

## 2019-03-05 DIAGNOSIS — Z8249 Family history of ischemic heart disease and other diseases of the circulatory system: Secondary | ICD-10-CM | POA: Diagnosis not present

## 2019-03-05 DIAGNOSIS — Z87442 Personal history of urinary calculi: Secondary | ICD-10-CM | POA: Insufficient documentation

## 2019-03-05 DIAGNOSIS — Z1211 Encounter for screening for malignant neoplasm of colon: Secondary | ICD-10-CM | POA: Diagnosis not present

## 2019-03-05 DIAGNOSIS — I10 Essential (primary) hypertension: Secondary | ICD-10-CM | POA: Insufficient documentation

## 2019-03-05 DIAGNOSIS — Z886 Allergy status to analgesic agent status: Secondary | ICD-10-CM | POA: Insufficient documentation

## 2019-03-05 HISTORY — PX: COLONOSCOPY: SHX5424

## 2019-03-05 HISTORY — PX: POLYPECTOMY: SHX5525

## 2019-03-05 SURGERY — COLONOSCOPY
Anesthesia: Moderate Sedation

## 2019-03-05 MED ORDER — SODIUM CHLORIDE 0.9 % IV SOLN
INTRAVENOUS | Status: DC
  Administered 2019-03-05: 10:00:00 via INTRAVENOUS

## 2019-03-05 MED ORDER — MIDAZOLAM HCL 5 MG/5ML IJ SOLN
INTRAMUSCULAR | Status: AC
  Filled 2019-03-05: qty 10

## 2019-03-05 MED ORDER — ONDANSETRON HCL 4 MG/2ML IJ SOLN
INTRAMUSCULAR | Status: DC | PRN
  Administered 2019-03-05: 4 mg via INTRAVENOUS

## 2019-03-05 MED ORDER — MIDAZOLAM HCL 5 MG/5ML IJ SOLN
INTRAMUSCULAR | Status: DC | PRN
  Administered 2019-03-05: 1 mg via INTRAVENOUS
  Administered 2019-03-05: 2 mg via INTRAVENOUS
  Administered 2019-03-05: 1 mg via INTRAVENOUS
  Administered 2019-03-05: 2 mg via INTRAVENOUS

## 2019-03-05 MED ORDER — MEPERIDINE HCL 100 MG/ML IJ SOLN
INTRAMUSCULAR | Status: DC | PRN
  Administered 2019-03-05: 25 mg via INTRAVENOUS
  Administered 2019-03-05: 15 mg via INTRAVENOUS
  Administered 2019-03-05: 10 mg via INTRAVENOUS

## 2019-03-05 MED ORDER — ONDANSETRON HCL 4 MG/2ML IJ SOLN
INTRAMUSCULAR | Status: AC
  Filled 2019-03-05: qty 2

## 2019-03-05 MED ORDER — MEPERIDINE HCL 50 MG/ML IJ SOLN
INTRAMUSCULAR | Status: AC
  Filled 2019-03-05: qty 1

## 2019-03-05 NOTE — H&P (Signed)
@LOGO @   Primary Care Physician:  Denny Levy, [place] Primary Gastroenterologist:  Dr. Gala Romney  Pre-Procedure History & Physical: HPI:  Shelby Guerra is a 63 y.o. female is here for a screening colonoscopy.  No bowel symptoms.  No family history of colon cancer.  Negative colonoscopy 10 years ago.  Past Medical History:  Diagnosis Date  . GERD (gastroesophageal reflux disease)   . High cholesterol   . Hot flashes 03/10/2013  . Hypertension    pt denies  . Impaired fasting glucose   . Kidney stone    oxalate/uric acid  . Moody 03/14/2014    Past Surgical History:  Procedure Laterality Date  . COLONOSCOPY  12/13/2008  . INGUINAL HERNIA REPAIR Left 02/26/2006   recurrent  . INGUINAL HERNIA REPAIR Left    prior to 2007  . KNEE ARTHROSCOPY WITH MEDIAL MENISECTOMY Left 12/04/2017   Procedure: KNEE ARTHROSCOPY WITH MEDIAL MENISCECTOMY AND CHONDROPLASTY;  Surgeon: Hiram Gash, MD;  Location: [facility];  Service: Orthopedics;  Laterality: Left;  NO BLOCK  . TOTAL ABDOMINAL HYSTERECTOMY W/ BILATERAL SALPINGOOPHORECTOMY  10/15/2006  . TUBAL LIGATION      Prior to Admission medications   Medication Sig Start Date End Date Taking? Authorizing Provider  Multiple Vitamin (MULTIVITAMIN) tablet Take 1 tablet by mouth daily.   Yes [provider]    Allergies as of 01/06/2019 - Review Complete 01/04/2019  Allergen Reaction Noted  . Codeine Itching   . Propoxyphene n-acetaminophen Nausea Only     Family History  Problem Relation Age of Onset  . Hypertension Brother   . Kidney failure Brother        had kidney transplant  . Hyperlipidemia Brother   . Hypertension Sister   . Hyperlipidemia Sister   . Cancer Sister        cervical  . Hypertension Brother   . Kidney failure Father   . Cancer Mother   . Colon cancer Neg Hx     Social History   Socioeconomic History  . Marital status: Single    Spouse name: Not on file  . Number of children: Not on  file  . Years of education: Not on file  . Highest education level: Not on file  Occupational History  . Not on file  Social Needs  . Financial resource strain: Not on file  . Food insecurity    Worry: Not on file    Inability: Not on file  . Transportation needs    Medical: Not on file    Non-medical: Not on file  Tobacco Use  . Smoking status: Never Smoker  . Smokeless tobacco: Never Used  Substance and Sexual Activity  . Alcohol use: No  . Drug use: No  . Sexual activity: Not Currently    Partners: Male    Birth control/protection: Surgical    Comment: hyst  Lifestyle  . Physical activity    Days per week: Not on file    Minutes per session: Not on file  . Stress: Not on file  Relationships  . Social Herbalist on phone: Not on file    Gets together: Not on file    Attends religious service: Not on file    Active member of club or organization: Not on file    Attends meetings of clubs or organizations: Not on file    Relationship status: Not on file  . Intimate partner violence    Fear of current or  ex partner: Not on file    Emotionally abused: Not on file    Physically abused: Not on file    Forced sexual activity: Not on file  Other Topics Concern  . Not on file  Social History Narrative  . Not on file    Review of Systems: See HPI, otherwise negative ROS  Physical Exam: BP 130/87   Pulse 65   Temp 97.8 F (36.6 C) (Oral)   Resp 12   Ht 5\' 2"  (1.575 m)   Wt 81.6 kg   SpO2 100%   BMI 32.92 kg/m  General:   Alert,  Well-developed, well-nourished, pleasant and cooperative in NAD Lungs:  Clear throughout to auscultation.   No wheezes, crackles, or rhonchi. No acute distress. Heart:  Regular rate and rhythm; no murmurs, clicks, rubs,  or gallops. Abdomen:  Soft, nontender and nondistended. No masses, hepatosplenomegaly or hernias noted. Normal bowel sounds, without guarding, and without rebound.    Impression/Plan: Shelby Guerra is now  here to undergo a screening colonoscopy.  Average rescreening examination.  The risks, benefits, limitations, alternatives and imponderables have been reviewed with the patient. Questions have been answered. All parties are agreeable.   Risks, benefits, limitations, imponderables and alternatives regarding colonoscopy have been reviewed with the patient. Questions have been answered. All parties agreeable.     Notice:  This dictation was prepared with Dragon dictation along with smaller phrase technology. Any transcriptional errors that result from this process are unintentional and may not be corrected upon review.

## 2019-03-05 NOTE — Discharge Instructions (Signed)
Colonoscopy Discharge Instructions  Read the instructions outlined below and refer to this sheet in the next few weeks. These discharge instructions provide you with general information on caring for yourself after you leave the hospital. Your doctor may also give you specific instructions. While your treatment has been planned according to the most current medical practices available, unavoidable complications occasionally occur. If you have any problems or questions after discharge, call Dr. Gala Romney at [phone]. ACTIVITY  You may resume your regular activity, but move at a slower pace for the next 24 hours.   Take frequent rest periods for the next 24 hours.   Walking will help get rid of the air and reduce the bloated feeling in your belly (abdomen).   No driving for 24 hours (because of the medicine (anesthesia) used during the test).    Do not sign any important legal documents or operate any machinery for 24 hours (because of the anesthesia used during the test).  NUTRITION  Drink plenty of fluids.   You may resume your normal diet as instructed by your doctor.   Begin with a light meal and progress to your normal diet. Heavy or fried foods are harder to digest and may make you feel sick to your stomach (nauseated).   Avoid alcoholic beverages for 24 hours or as instructed.  MEDICATIONS  You may resume your normal medications unless your doctor tells you otherwise.  WHAT YOU CAN EXPECT TODAY  Some feelings of bloating in the abdomen.   Passage of more gas than usual.   Spotting of blood in your stool or on the toilet paper.  IF YOU HAD POLYPS REMOVED DURING THE COLONOSCOPY:  No aspirin products for 7 days or as instructed.   No alcohol for 7 days or as instructed.   Eat a soft diet for the next 24 hours.  FINDING OUT THE RESULTS OF YOUR TEST Not all test results are available during your visit. If your test results are not back during the visit, make an appointment  with your caregiver to find out the results. Do not assume everything is normal if you have not heard from your caregiver or the medical facility. It is important for you to follow up on all of your test results.  SEEK IMMEDIATE MEDICAL ATTENTION IF:  You have more than a spotting of blood in your stool.   Your belly is swollen (abdominal distention).   You are nauseated or vomiting.   You have a temperature over 101.   You have abdominal pain or discomfort that is severe or gets worse throughout the day.Colonoscopy and  Colon polyp and diverticulosis information provided  Further recommendations to follow pending review of pathology report  At patient's request, I called her son at [phone] and discussed my impression and recommendations      Colon Polyps  Polyps are tissue growths inside the body. Polyps can grow in many places, including the large intestine (colon). A polyp may be a round bump or a mushroom-shaped growth. You could have one polyp or several. Most colon polyps are noncancerous (benign). However, some colon polyps can become cancerous over time. Finding and removing the polyps early can help prevent this. What are the causes? The exact cause of colon polyps is not known. What increases the risk? You are more likely to develop this condition if you:  Have a family history of colon cancer or colon polyps.  Are older than 50 or older than 45 if you are  African American.  Have inflammatory bowel disease, such as ulcerative colitis or Crohn's disease.  Have certain hereditary conditions, such as: ? Familial adenomatous polyposis. ? Lynch syndrome. ? Turcot syndrome. ? Peutz-Jeghers syndrome.  Are overweight.  Smoke cigarettes.  Do not get enough exercise.  Drink too much alcohol.  Eat a diet that is high in fat and red meat and low in fiber.  Had childhood cancer that was treated with abdominal radiation. What are the signs or symptoms? Most  polyps do not cause symptoms. If you have symptoms, they may include:  Blood coming from your rectum when having a bowel movement.  Blood in your stool. The stool may look dark red or black.  Abdominal pain.  A change in bowel habits, such as constipation or diarrhea. How is this diagnosed? This condition is diagnosed with a colonoscopy. This is a procedure in which a lighted, flexible scope is inserted into the anus and then passed into the colon to examine the area. Polyps are sometimes found when a colonoscopy is done as part of routine cancer screening tests. How is this treated? Treatment for this condition involves removing any polyps that are found. Most polyps can be removed during a colonoscopy. Those polyps will then be tested for cancer. Additional treatment may be needed depending on the results of testing. Follow these instructions at home: Lifestyle  Maintain a healthy weight, or lose weight if recommended by your health care provider.  Exercise every day or as told by your health care provider.  Do not use any products that contain nicotine or tobacco, such as cigarettes and e-cigarettes. If you need help quitting, ask your health care provider.  If you drink alcohol, limit how much you have: ? 0-1 drink a day for women. ? 0-2 drinks a day for men.  Be aware of how much alcohol is in your drink. In the [place] one drink equals one 12 oz bottle of beer (355 mL), one 5 oz glass of wine (148 mL), or one 1 oz shot of hard liquor (44 mL). Eating and drinking   Eat foods that are high in fiber, such as fruits, vegetables, and whole grains.  Eat foods that are high in calcium and vitamin D, such as milk, cheese, yogurt, eggs, liver, fish, and broccoli.  Limit foods that are high in fat, such as fried foods and desserts.  Limit the amount of red meat and processed meat you eat, such as hot dogs, sausage, bacon, and lunch meats. General instructions  Keep all follow-up  visits as told by your health care provider. This is important. ? This includes having regularly scheduled colonoscopies. ? Talk to your health care provider about when you need a colonoscopy. Contact a health care provider if:  You have new or worsening bleeding during a bowel movement.  You have new or increased blood in your stool.  You have a change in bowel habits.  You lose weight for no known reason. Summary  Polyps are tissue growths inside the body. Polyps can grow in many places, including the colon.  Most colon polyps are noncancerous (benign), but some can become cancerous over time.  This condition is diagnosed with a colonoscopy.  Treatment for this condition involves removing any polyps that are found. Most polyps can be removed during a colonoscopy. This information is not intended to replace advice given to you by your health care provider. Make sure you discuss any questions you have with your health care provider.  Document Released: 02/21/2004 Document Revised: 09/11/2017 Document Reviewed: 09/11/2017 Elsevier Patient Education  2020 [employer].     Diverticulosis  Diverticulosis is a condition that develops when small pouches (diverticula) form in the wall of the large intestine (colon). The colon is where water is absorbed and stool is formed. The pouches form when the inside layer of the colon pushes through weak spots in the outer layers of the colon. You may have a few pouches or many of them. What are the causes? The cause of this condition is not known. What increases the risk? The following factors may make you more likely to develop this condition:  Being older than age 65. Your risk for this condition increases with age. Diverticulosis is rare among people younger than age 59. By age 50, many people have it.  Eating a low-fiber diet.  Having frequent constipation.  Being overweight.  Not getting enough exercise.  Smoking.  Taking  over-the-counter pain medicines, like aspirin and ibuprofen.  Having a family history of diverticulosis. What are the signs or symptoms? In most people, there are no symptoms of this condition. If you do have symptoms, they may include:  Bloating.  Cramps in the abdomen.  Constipation or diarrhea.  Pain in the lower left side of the abdomen. How is this diagnosed? This condition is most often diagnosed during an exam for other colon problems. Because diverticulosis usually has no symptoms, it often cannot be diagnosed independently. This condition may be diagnosed by:  Using a flexible scope to examine the colon (colonoscopy).  Taking an X-ray of the colon after dye has been put into the colon (barium enema).  Doing a CT scan. How is this treated? You may not need treatment for this condition if you have never developed an infection related to diverticulosis. If you have had an infection before, treatment may include:  Eating a high-fiber diet. This may include eating more fruits, vegetables, and grains.  Taking a fiber supplement.  Taking a live bacteria supplement (probiotic).  Taking medicine to relax your colon.  Taking antibiotic medicines. Follow these instructions at home:  Drink 6-8 glasses of water or more each day to prevent constipation.  Try not to strain when you have a bowel movement.  If you have had an infection before: ? Eat more fiber as directed by your health care provider or your diet and nutrition specialist (dietitian). ? Take a fiber supplement or probiotic, if your health care provider approves.  Take over-the-counter and prescription medicines only as told by your health care provider.  If you were prescribed an antibiotic, take it as told by your health care provider. Do not stop taking the antibiotic even if you start to feel better.  Keep all follow-up visits as told by your health care provider. This is important. Contact a health care  provider if:  You have pain in your abdomen.  You have bloating.  You have cramps.  You have not had a bowel movement in 3 days. Get help right away if:  Your pain gets worse.  Your bloating becomes very bad.  You have a fever or chills, and your symptoms suddenly get worse.  You vomit.  You have bowel movements that are bloody or black.  You have bleeding from your rectum. Summary  Diverticulosis is a condition that develops when small pouches (diverticula) form in the wall of the large intestine (colon).  You may have a few pouches or many of them.  This  condition is most often diagnosed during an exam for other colon problems.  If you have had an infection related to diverticulosis, treatment may include increasing the fiber in your diet, taking supplements, or taking medicines. This information is not intended to replace advice given to you by your health care provider. Make sure you discuss any questions you have with your health care provider. Document Released: 02/22/2004 Document Revised: 05/09/2017 Document Reviewed: 04/15/2016 Elsevier Patient Education  [place].    High-Fiber Diet Fiber, also called dietary fiber, is a type of carbohydrate that is found in fruits, vegetables, whole grains, and beans. A high-fiber diet can have many health benefits. Your health care provider may recommend a high-fiber diet to help:  Prevent constipation. Fiber can make your bowel movements more regular.  Lower your cholesterol.  Relieve the following conditions: ? Swelling of veins in the anus (hemorrhoids). ? Swelling and irritation (inflammation) of specific areas of the digestive tract (uncomplicated diverticulosis). ? A problem of the large intestine (colon) that sometimes causes pain and diarrhea (irritable bowel syndrome, IBS).  Prevent overeating as part of a weight-loss plan.  Prevent heart disease, type 2 diabetes, and certain cancers. What is my  plan? The recommended daily fiber intake in grams (g) includes:  38 g for men age 94 or younger.  30 g for men over age 25.  48 g for women age 14 or younger.  21 g for women over age 85. You can get the recommended daily intake of dietary fiber by:  Eating a variety of fruits, vegetables, grains, and beans.  Taking a fiber supplement, if it is not possible to get enough fiber through your diet. What do I need to know about a high-fiber diet?  It is better to get fiber through food sources rather than from fiber supplements. There is not a lot of research about how effective supplements are.  Always check the fiber content on the nutrition facts label of any prepackaged food. Look for foods that contain 5 g of fiber or more per serving.  Talk with a diet and nutrition specialist (dietitian) if you have questions about specific foods that are recommended or not recommended for your medical condition, especially if those foods are not listed below.  Gradually increase how much fiber you consume. If you increase your intake of dietary fiber too quickly, you may have bloating, cramping, or gas.  Drink plenty of water. Water helps you to digest fiber. What are tips for following this plan?  Eat a wide variety of high-fiber foods.  Make sure that half of the grains that you eat each day are whole grains.  Eat breads and cereals that are made with whole-grain flour instead of refined flour or white flour.  Eat brown rice, bulgur wheat, or millet instead of white rice.  Start the day with a breakfast that is high in fiber, such as a cereal that contains 5 g of fiber or more per serving.  Use beans in place of meat in soups, salads, and pasta dishes.  Eat high-fiber snacks, such as berries, raw vegetables, nuts, and popcorn.  Choose whole fruits and vegetables instead of processed forms like juice or sauce. What foods can I eat?  Fruits Berries. Pears. Apples. Oranges. Avocado.  Prunes and raisins. Dried figs. Vegetables Sweet potatoes. Spinach. Kale. Artichokes. Cabbage. Broccoli. Cauliflower. Green peas. Carrots. Squash. Grains Whole-grain breads. Multigrain cereal. Oats and oatmeal. Brown rice. Barley. Bulgur wheat. [place]. Quinoa. Bran muffins. Popcorn.  Rye wafer crackers. Meats and other proteins Navy, kidney, and pinto beans. Soybeans. Split peas. Lentils. Nuts and seeds. Dairy Fiber-fortified yogurt. Beverages Fiber-fortified soy milk. Fiber-fortified orange juice. Other foods Fiber bars. The items listed above may not be a complete list of recommended foods and beverages. Contact a dietitian for more options. What foods are not recommended? Fruits Fruit juice. Cooked, strained fruit. Vegetables Fried potatoes. Canned vegetables. Well-cooked vegetables. Grains White bread. Pasta made with refined flour. White rice. Meats and other proteins Fatty cuts of meat. Fried chicken or fried fish. Dairy Milk. Yogurt. Cream cheese. Sour cream. Fats and oils Butters. Beverages Soft drinks. Other foods Cakes and pastries. The items listed above may not be a complete list of foods and beverages to avoid. Contact a dietitian for more information. Summary  Fiber is a type of carbohydrate. It is found in fruits, vegetables, whole grains, and beans.  There are many health benefits of eating a high-fiber diet, such as preventing constipation, lowering blood cholesterol, helping with weight loss, and reducing your risk of heart disease, diabetes, and certain cancers.  Gradually increase your intake of fiber. Increasing too fast can result in cramping, bloating, and gas. Drink plenty of water while you increase your fiber.  The best sources of fiber include whole fruits and vegetables, whole grains, nuts, seeds, and beans. This information is not intended to replace advice given to you by your health care provider. Make sure you discuss any questions you have with  your health care provider. Document Released: 05/27/2005 Document Revised: 03/31/2017 Document Reviewed: 03/31/2017 Elsevier Patient Education  2020 [employer].

## 2019-03-05 NOTE — Op Note (Signed)
[facility] Patient Name: Shelby Guerra Procedure Date: 03/05/2019 10:15 AM MRN: [id number] Date of Birth: 04-01-1956 Attending MD: Norvel Richards , MD CSN: [id number] Age: 63 Admit Type: Outpatient Procedure:                Colonoscopy Indications:              Screening for colorectal malignant neoplasm Providers:                Norvel Richards, MD, Charlsie Quest. Theda Sers RN, RN,                            Randa Spike, Technician Referring MD:              Medicines:                Midazolam 6 mg IV, Meperidine 50 mg IV Complications:            No immediate complications. Estimated Blood Loss:     Estimated blood loss was minimal. Procedure:                Pre-Anesthesia Assessment:                           - Prior to the procedure, a History and Physical                            was performed, and patient medications and                            allergies were reviewed. The patient's tolerance of                            previous anesthesia was also reviewed. The risks                            and benefits of the procedure and the sedation                            options and risks were discussed with the patient.                            All questions were answered, and informed consent                            was obtained. Prior Anticoagulants: The patient has                            taken no previous anticoagulant or antiplatelet                            agents. ASA Grade Assessment: II - A patient with                            mild systemic disease. After reviewing the risks  and benefits, the patient was deemed in                            satisfactory condition to undergo the procedure.                           After obtaining informed consent, the colonoscope                            was passed under direct vision. Throughout the                            procedure, the patient's blood pressure, pulse, and                             oxygen saturations were monitored continuously. The                            [id number] scope was introduced through                            the anus and advanced to the the cecum, identified                            by appendiceal orifice and ileocecal valve. The                            colonoscopy was performed without difficulty. The                            patient tolerated the procedure well. The quality                            of the bowel preparation was adequate. Scope In: 10:49:34 AM Scope Out: [id number] AM Scope Withdrawal Time: 0 hours 8 minutes 3 seconds  Total Procedure Duration: 0 hours 14 minutes 34 seconds  Findings:      The perianal and digital rectal examinations were normal.      Multiple diverticula were found in the sigmoid colon and descending       colon.      A 7 mm polyp was found in the cecum. The polyp was sessile. The polyp       was removed with a cold snare. Resection and retrieval were complete.       Estimated blood loss was minimal.      The exam was otherwise without abnormality on direct and retroflexion       views. Impression:               - Diverticulosis in the sigmoid colon and in the                            descending colon.                           - One 7 mm polyp in the cecum, removed with  a cold                            snare. Resected and retrieved.                           - The examination was otherwise normal on direct                            and retroflexion views. Moderate Sedation:      Moderate (conscious) sedation was administered by the endoscopy nurse       and supervised by the endoscopist. The following parameters were       monitored: oxygen saturation, heart rate, blood pressure, respiratory       rate, EKG, adequacy of pulmonary ventilation, and response to care.       Total physician intraservice time was 20 minutes. Recommendation:           - Patient has a  contact number available for                            emergencies. The signs and symptoms of potential                            delayed complications were discussed with the                            patient. Return to normal activities tomorrow.                            Written discharge instructions were provided to the                            patient.                           - Resume previous diet.                           - Continue present medications.                           - Repeat colonoscopy date to be determined after                            pending pathology results are reviewed for                            surveillance based on pathology results.                           - Return to GI office after studies are complete. Procedure Code(s):        --- Professional ---                           [id number], Colonoscopy, flexible; with removal of  tumor(s), polyp(s), or other lesion(s) by snare                            technique                           G0500, Moderate sedation services provided by the                            same physician or other qualified health care                            professional performing a gastrointestinal                            endoscopic service that sedation supports,                            requiring the presence of an independent trained                            observer to assist in the monitoring of the                            patient's level of consciousness and physiological                            status; initial 15 minutes of intra-service time;                            patient age 45 years or older (additional time may                            be reported with [phone], as appropriate) Diagnosis Code(s):        --- Professional ---                           Z12.11, Encounter for screening for malignant                            neoplasm of colon                            K63.5, Polyp of colon                           K57.30, Diverticulosis of large intestine without                            perforation or abscess without bleeding CPT copyright 2019 American Medical Association. All rights reserved. The codes documented in this report are preliminary and upon coder review may  be revised to meet current compliance requirements. Cristopher Estimable. Sharika Mosquera, MD Norvel Richards, MD 03/05/2019 11:12:21 AM This report has been signed electronically. Number of Addenda: 0

## 2019-03-08 ENCOUNTER — Encounter: Payer: Self-pay | Admitting: Internal Medicine

## 2019-03-08 LAB — SURGICAL PATHOLOGY

## 2019-03-10 ENCOUNTER — Encounter (HOSPITAL_COMMUNITY): Payer: Self-pay | Admitting: Internal Medicine

## 2019-04-21 ENCOUNTER — Telehealth: Payer: Self-pay | Admitting: *Deleted

## 2019-04-21 NOTE — Telephone Encounter (Signed)
Pt left message that she had a mammogram over the summer and would like to have a copy of the results.

## 2019-04-21 NOTE — Telephone Encounter (Signed)
Returned patient's call but unable to leave message as mailbox was full.  Mammogram results printed but patient she also be available for her to view in [place].

## 2019-04-23 DIAGNOSIS — Z23 Encounter for immunization: Secondary | ICD-10-CM | POA: Diagnosis not present

## 2019-04-23 DIAGNOSIS — Z0000 Encounter for general adult medical examination without abnormal findings: Secondary | ICD-10-CM | POA: Diagnosis not present

## 2019-04-23 DIAGNOSIS — S6991XA Unspecified injury of right wrist, hand and finger(s), initial encounter: Secondary | ICD-10-CM | POA: Diagnosis not present

## 2019-04-23 DIAGNOSIS — Z6831 Body mass index (BMI) 31.0-31.9, adult: Secondary | ICD-10-CM | POA: Diagnosis not present

## 2019-05-10 ENCOUNTER — Other Ambulatory Visit: Payer: Self-pay

## 2019-05-10 ENCOUNTER — Ambulatory Visit (INDEPENDENT_AMBULATORY_CARE_PROVIDER_SITE_OTHER): Payer: BC Managed Care – PPO | Admitting: Adult Health

## 2019-05-10 ENCOUNTER — Encounter: Payer: Self-pay | Admitting: Adult Health

## 2019-05-10 VITALS — BP 137/84 | HR 53 | Ht 62.0 in | Wt 174.8 lb

## 2019-05-10 DIAGNOSIS — Z1212 Encounter for screening for malignant neoplasm of rectum: Secondary | ICD-10-CM

## 2019-05-10 DIAGNOSIS — R350 Frequency of micturition: Secondary | ICD-10-CM | POA: Diagnosis not present

## 2019-05-10 DIAGNOSIS — R4589 Other symptoms and signs involving emotional state: Secondary | ICD-10-CM

## 2019-05-10 DIAGNOSIS — Z1211 Encounter for screening for malignant neoplasm of colon: Secondary | ICD-10-CM | POA: Diagnosis not present

## 2019-05-10 DIAGNOSIS — Z01419 Encounter for gynecological examination (general) (routine) without abnormal findings: Secondary | ICD-10-CM

## 2019-05-10 LAB — POCT URINALYSIS DIPSTICK OB
Blood, UA: NEGATIVE
Glucose, UA: NEGATIVE
Ketones, UA: NEGATIVE
Leukocytes, UA: NEGATIVE
Nitrite, UA: NEGATIVE
POC,PROTEIN,UA: NEGATIVE

## 2019-05-10 LAB — HEMOCCULT GUIAC POC 1CARD (OFFICE): Fecal Occult Blood, POC: NEGATIVE

## 2019-05-10 MED ORDER — SERTRALINE HCL 50 MG PO TABS: 50.0000 mg | ORAL_TABLET | Freq: Every day | ORAL | 6 refills | Status: DC

## 2019-05-10 NOTE — Progress Notes (Signed)
Patient ID: Shelby Guerra, female   DOB: 1956-03-16, 63 y.o.   MRN: [id number] History of Present Illness: Shelby Guerra is a 63 year old black female, single, sp hysterectomy in for a well woman gyn exam. She is still working. She had a negative mammogram 12/21/2018 and colonoscopy 03/05/2019. PCP is Denny Levy PA.    Current Medications, Allergies, Past Medical History, Past Surgical History, Family History and Social History were reviewed in [employer] record.     Review of Systems: Patient denies any headaches, hearing loss, fatigue, blurred vision, shortness of breath, chest pain, abdominal pain, problems with bowel movements,  or intercourse. No joint pain. +moody, she stopped Effexor, as drug store keep changing brands and she felt sick  +urinary frequency, no pain   Physical Exam:BP 137/84 (BP Location: Right Arm, Patient Position: Sitting, Cuff Size: Normal)   Pulse (!) 53   Ht 5\' 2"  (1.575 m)   Wt 174 lb 12.8 oz (79.3 kg)   BMI 31.97 kg/m   Urine dipstick is negative  General:  Well developed, well nourished, no acute distress Skin:  Warm and dry Neck:  Midline trachea, normal thyroid, good ROM, no lymphadenopathy,no carotid bruits heard Lungs; Clear to auscultation bilaterally Breast:  No dominant palpable mass, retraction, or nipple discharge Cardiovascular: Regular rate and rhythm Abdomen:  Soft, non tender, no hepatosplenomegaly Pelvic:  External genitalia is normal in appearance, no lesions.  The vagina is pale with loss of moisture and rugae. Urethra has no lesions or masses. The cervix and uterus are absent.  No adnexal masses or tenderness noted.Bladder is non tender, no masses felt. Rectal: Good sphincter tone, no polyps, or hemorrhoids felt.  Hemoccult negative. Extremities/musculoskeletal:  No swelling or varicosities noted, no clubbing or cyanosis Psych:  No mood changes, alert and cooperative,seems happy Fall risk is low PHQ 2 score  0. Pt gave verbal consent for exam with no chaperone.  Impression and Plan 1. Urinary frequency   2. Encounter for well woman exam with routine gynecological exam Physical in 1 year Mammogram yearly Labs with PCP  3. Screening for colorectal cancer Colonoscopy per GI  4. Moody Will try zoloft. Meds ordered this encounter  Medications  . sertraline (ZOLOFT) 50 MG tablet    Sig: Take 1 tablet (50 mg total) by mouth daily.    Dispense:  30 tablet    Refill:  6    Order Specific Question:   Supervising Provider    Answer:   Tania Ade H [[id number]]

## 2019-05-13 DIAGNOSIS — H2513 Age-related nuclear cataract, bilateral: Secondary | ICD-10-CM | POA: Diagnosis not present

## 2019-05-16 ENCOUNTER — Other Ambulatory Visit: Payer: Self-pay

## 2019-05-16 ENCOUNTER — Emergency Department (HOSPITAL_COMMUNITY)
Admission: EM | Admit: 2019-05-16 | Discharge: 2019-05-17 | Disposition: A | Discharge status: Home / Self Care | Payer: BC Managed Care – PPO | Attending: Emergency Medicine | Admitting: Emergency Medicine

## 2019-05-16 ENCOUNTER — Encounter (HOSPITAL_COMMUNITY): Payer: Self-pay | Admitting: Emergency Medicine

## 2019-05-16 DIAGNOSIS — I10 Essential (primary) hypertension: Secondary | ICD-10-CM | POA: Insufficient documentation

## 2019-05-16 DIAGNOSIS — S59911A Unspecified injury of right forearm, initial encounter: Secondary | ICD-10-CM | POA: Diagnosis not present

## 2019-05-16 DIAGNOSIS — S63509A Unspecified sprain of unspecified wrist, initial encounter: Secondary | ICD-10-CM

## 2019-05-16 DIAGNOSIS — Y999 Unspecified external cause status: Secondary | ICD-10-CM | POA: Diagnosis not present

## 2019-05-16 DIAGNOSIS — S6991XA Unspecified injury of right wrist, hand and finger(s), initial encounter: Secondary | ICD-10-CM | POA: Diagnosis not present

## 2019-05-16 DIAGNOSIS — W010XXA Fall on same level from slipping, tripping and stumbling without subsequent striking against object, initial encounter: Secondary | ICD-10-CM | POA: Insufficient documentation

## 2019-05-16 DIAGNOSIS — Y92000 Kitchen of unspecified non-institutional (private) residence as  the place of occurrence of the external cause: Secondary | ICD-10-CM | POA: Insufficient documentation

## 2019-05-16 DIAGNOSIS — S63501A Unspecified sprain of right wrist, initial encounter: Secondary | ICD-10-CM | POA: Diagnosis not present

## 2019-05-16 DIAGNOSIS — Z79899 Other long term (current) drug therapy: Secondary | ICD-10-CM | POA: Diagnosis not present

## 2019-05-16 DIAGNOSIS — Y93G9 Activity, other involving cooking and grilling: Secondary | ICD-10-CM | POA: Insufficient documentation

## 2019-05-16 MED ORDER — IBUPROFEN 800 MG PO TABS
800.0000 mg | ORAL_TABLET | Freq: Once | ORAL | Status: AC
  Administered 2019-05-17: 800 mg via ORAL
  Filled 2019-05-16: qty 1

## 2019-05-16 NOTE — ED Provider Notes (Signed)
[facility] EMERGENCY DEPARTMENT Provider Note   CSN: [id number] Arrival date & time: 05/16/19  2321     History   Chief Complaint Chief Complaint  Patient presents with  . Wrist Pain    HPI Millana Stahl Boreman is a 63 y.o. female.     HPI  This patient is a 63 year old female, reports that she was cooking in the kitchen at approximately 4:00 when she slipped on some food that was on the ground and as she fell she tried to catch herself with her right hand which went behind her back.  She felt acute onset of pain in the right wrist more on the dorsum of the wrist which is been persistent for the last 8 hours.  She has tried to continue her daily activities but because of pain and mild swelling she comes to the hospital.  She denies associated numbness or weakness, denies associated injuries including head injury lower extremity injuries back or neck pain.  She takes no anti-inflammatories, she takes no anticoagulants, she did take Tylenol with minimal relief.  Symptoms are persistent and worse with range of motion of the right upper extremity at the wrist  Past Medical History:  Diagnosis Date  . GERD (gastroesophageal reflux disease)   . High cholesterol   . Hot flashes 03/10/2013  . Hypertension    pt denies  . Impaired fasting glucose   . Kidney stone    oxalate/uric acid  . Moody 03/14/2014    Patient Active Problem List   Diagnosis Date Noted  . Encounter for well woman exam with routine gynecological exam 05/10/2019  . Urinary frequency 05/10/2019  . Screening for colorectal cancer 05/05/2018  . Left knee pain 05/22/2017  . Well female exam with routine gynecological exam 05/22/2017  . Depression 02/01/2015  . Moody 03/14/2014  . Hot flashes 03/10/2013  . GERD 11/29/2009  . CONSTIPATION, CHRONIC 11/29/2009  . NAUSEA, CHRONIC 11/29/2009    Past Surgical History:  Procedure Laterality Date  . COLONOSCOPY  12/13/2008  . COLONOSCOPY N/A 03/05/2019   Procedure:  COLONOSCOPY;  Surgeon: Daneil Dolin, MD;  [facility];  Service: Endoscopy;  Laterality: N/A;  10:30  . INGUINAL HERNIA REPAIR Left 02/26/2006   recurrent  . INGUINAL HERNIA REPAIR Left    prior to 2007  . KNEE ARTHROSCOPY WITH MEDIAL MENISECTOMY Left 12/04/2017   Procedure: KNEE ARTHROSCOPY WITH MEDIAL MENISCECTOMY AND CHONDROPLASTY;  Surgeon: Hiram Gash, MD;  Location: [place] [facility];  Service: Orthopedics;  Laterality: Left;  NO BLOCK  . POLYPECTOMY  03/05/2019   Procedure: POLYPECTOMY;  Surgeon: Daneil Dolin, MD;  Location: [facility];  Service: Endoscopy;;  . TOTAL ABDOMINAL HYSTERECTOMY W/ BILATERAL SALPINGOOPHORECTOMY  10/15/2006  . TUBAL LIGATION       OB History    Gravida  1   Para  1   Term      Preterm      AB      Living  1     SAB      TAB      Ectopic      Multiple      Live Births  1            Home Medications    Prior to Admission medications   Medication Sig Start Date End Date Taking? Authorizing Provider  Fluocinolone Acetonide Scalp 0.01 % OIL APPLY TO THE SCALP TWICE A DAY AS NEEDED AS NEEDED FOR ITCHING  02/10/19   [provider]  Multiple Vitamin (MULTIVITAMIN) tablet Take 1 tablet by mouth daily.    [provider]  naproxen (NAPROSYN) 500 MG tablet Take 1 tablet (500 mg total) by mouth 2 (two) times daily with a meal. 05/17/19   Noemi Chapel, MD  sertraline (ZOLOFT) 50 MG tablet Take 1 tablet (50 mg total) by mouth daily. 05/10/19   Estill Dooms, NP    Family History Family History  Problem Relation Age of Onset  . Hypertension Brother   . Kidney failure Brother        had kidney transplant  . Hyperlipidemia Brother   . Hypertension Sister   . Hyperlipidemia Sister   . Cancer Sister        cervical  . Hypertension Brother   . Kidney failure Father   . Cancer Mother   . Colon cancer Neg Hx     Social History Social History   Tobacco Use  . Smoking status: Never  Smoker  . Smokeless tobacco: Never Used  Substance Use Topics  . Alcohol use: No  . Drug use: No     Allergies   Codeine and Propoxyphene n-acetaminophen   Review of Systems Review of Systems  Musculoskeletal: Positive for joint swelling.  Skin: Negative for rash and wound.  Neurological: Negative for weakness and numbness.     Physical Exam Updated Vital Signs BP 128/64 (BP Location: Left Arm)   Pulse (!) 57   Temp 98.8 F (37.1 C) (Oral)   Resp 16   SpO2 100%   Physical Exam Vitals signs and nursing note reviewed.  Constitutional:      General: She is not in acute distress.    Appearance: She is well-developed. She is not diaphoretic.  HENT:     Head: Normocephalic and atraumatic.  Eyes:     General: No scleral icterus.    Conjunctiva/sclera: Conjunctivae normal.  Cardiovascular:     Rate and Rhythm: Normal rate and regular rhythm.  Pulmonary:     Effort: Pulmonary effort is normal.     Breath sounds: Normal breath sounds.  Musculoskeletal:        General: Tenderness present.     Comments: Mild swelling and tenderness of the right wrist, decreased range of motion of the wrist.  The patient has some difficulty with pronation and supination of the right upper extremity secondary to pain at the wrist however there is no tenderness at the elbow she has full range of motion at the elbow to flexion and extension.  She has normal range of motion of the shoulder on the right.  All other extremities have normal range of motion.  She is able to grip with the right hand.  This does cause some pain at the wrist.  There is mild deformity at that location, there is no open wound.  Skin:    General: Skin is warm and dry.     Findings: No rash.  Neurological:     Mental Status: She is alert.     Comments: The patient has normal sensation of the right hand diffusely, she has normal neurologic function to the right upper extremity with strength in the grip, ulnar function, radial  function and median nerve function.      ED Treatments / Results  Labs (all labs ordered are listed, but only abnormal results are displayed) Labs Reviewed - No data to display  EKG None  Radiology Dg Forearm Right  Result Date: 05/17/2019  CLINICAL DATA:  Fall EXAM: RIGHT FOREARM - 2 VIEW COMPARISON:  None. FINDINGS: There is no evidence of fracture or other focal bone lesions. Soft tissues are unremarkable. IMPRESSION: Negative. Electronically Signed   By: Ulyses Jarred M.D.   On: 05/17/2019 00:43   Dg Wrist Complete Right  Result Date: 05/17/2019 CLINICAL DATA:  Fall EXAM: RIGHT WRIST - COMPLETE 3+ VIEW COMPARISON:  None. FINDINGS: There is no evidence of fracture or dislocation. There is no evidence of arthropathy or other focal bone abnormality. Soft tissues are unremarkable. IMPRESSION: Negative. Electronically Signed   By: Ulyses Jarred M.D.   On: 05/17/2019 00:43    Procedures .Splint Application  Date/Time: 05/17/2019 1:11 AM Performed by: Noemi Chapel, MD Authorized by: Noemi Chapel, MD   Consent:    Consent obtained:  Verbal   Consent given by:  Patient   Risks discussed:  Discoloration, numbness, pain and swelling   Alternatives discussed:  No treatment Pre-procedure details:    Sensation:  Normal Procedure details:    Laterality:  Right   Location:  Wrist   Wrist:  R wrist   Splint type:  Sugar tong   Supplies:  Sling, Ortho-Glass, cotton padding and elastic bandage Post-procedure details:    Pain:  Improved   Sensation:  Normal   Patient tolerance of procedure:  Tolerated well, no immediate complications Comments:         (including critical care time)  Medications Ordered in ED Medications  ibuprofen (ADVIL) tablet 800 mg (800 mg Oral Given 05/17/19 0029)     Initial Impression / Assessment and Plan / ED Course  I have reviewed the triage vital signs and the nursing notes.  Pertinent labs & imaging results that were available during my care  of the patient were reviewed by me and considered in my medical decision making (see chart for details).       Other than pain and mild tenderness with swelling the patient does not have any other neurologic injuries.  I suspect that she has a distal forearm or wrist fracture or sprain, x-ray have been ordered, ice, elevate, anti-inflammatories initiated.  The patient is agreeable to the plan.  She will need a splint and a sling, see procedure notes attached  X-rays negative, there could be a slight occult fracture but radiology states no fracture.  Will need follow-up with orthopedics, patient informed, she wants to work and does not want any time off.  Final Clinical Impressions(s) / ED Diagnoses   Final diagnoses:  Wrist sprain, unspecified laterality, initial encounter    ED Discharge Orders         Ordered    naproxen (NAPROSYN) 500 MG tablet  2 times daily with meals     05/17/19 Janace Litten, MD 05/17/19 [phone]

## 2019-05-16 NOTE — ED Triage Notes (Signed)
Pt states she fell backwards after tripping earlier today. Pt reports trying to catch herself on her right wrist.

## 2019-05-17 ENCOUNTER — Emergency Department (HOSPITAL_COMMUNITY): Payer: BC Managed Care – PPO

## 2019-05-17 DIAGNOSIS — S6991XA Unspecified injury of right wrist, hand and finger(s), initial encounter: Secondary | ICD-10-CM | POA: Diagnosis not present

## 2019-05-17 DIAGNOSIS — S59911A Unspecified injury of right forearm, initial encounter: Secondary | ICD-10-CM | POA: Diagnosis not present

## 2019-05-17 MED ORDER — KETOROLAC TROMETHAMINE 60 MG/2ML IM SOLN: 60.0000 mg | Freq: Once | INTRAMUSCULAR | Status: DC

## 2019-05-17 MED ORDER — NAPROXEN 500 MG PO TABS: 500.0000 mg | ORAL_TABLET | Freq: Two times a day (BID) | ORAL | 0 refills | Status: DC

## 2019-05-17 NOTE — ED Notes (Signed)
Pt ambulatory to waiting room. Pt verbalized understanding of discharge instructions.   

## 2019-05-17 NOTE — Discharge Instructions (Addendum)
Take naproxen twice a day for pain, seek medical exam for severe or worsening symptoms  Continue to use your splint for comfort over the next couple of days, try not to get it wet.  Seek medical exam with your orthopedic surgeon within 1 week to repeat your exam and x-rays.  If you continue to have swelling and pain keep the arm elevated with an ice pack.  You may take naproxen twice daily as needed for pain  ER for severe or worsening symptoms

## 2019-05-31 ENCOUNTER — Other Ambulatory Visit: Payer: Self-pay

## 2019-05-31 ENCOUNTER — Encounter: Payer: Self-pay | Admitting: Adult Health

## 2019-05-31 ENCOUNTER — Ambulatory Visit (INDEPENDENT_AMBULATORY_CARE_PROVIDER_SITE_OTHER): Payer: BC Managed Care – PPO | Admitting: Adult Health

## 2019-05-31 VITALS — BP 132/81 | HR 59 | Ht 62.0 in | Wt 172.0 lb

## 2019-05-31 DIAGNOSIS — R300 Dysuria: Secondary | ICD-10-CM

## 2019-05-31 DIAGNOSIS — R350 Frequency of micturition: Secondary | ICD-10-CM | POA: Diagnosis not present

## 2019-05-31 DIAGNOSIS — R319 Hematuria, unspecified: Secondary | ICD-10-CM

## 2019-05-31 DIAGNOSIS — Z87442 Personal history of urinary calculi: Secondary | ICD-10-CM | POA: Insufficient documentation

## 2019-05-31 LAB — POCT URINALYSIS DIPSTICK OB
Glucose, UA: NEGATIVE
Ketones, UA: NEGATIVE
Nitrite, UA: NEGATIVE

## 2019-05-31 MED ORDER — PHENAZOPYRIDINE HCL 200 MG PO TABS: 200.0000 mg | ORAL_TABLET | Freq: Three times a day (TID) | ORAL | 0 refills | Status: DC | PRN

## 2019-05-31 MED ORDER — SULFAMETHOXAZOLE-TRIMETHOPRIM 800-160 MG PO TABS: 1.0000 | ORAL_TABLET | Freq: Two times a day (BID) | ORAL | 0 refills | Status: DC

## 2019-05-31 NOTE — Progress Notes (Signed)
  Subjective:     Patient ID: Shelby Guerra, female   DOB: March 24, 1956, 63 y.o.   MRN: [id number]  HPI Shelby Guerra is a 63 year old black female, sp hysterectomy in complaining of urinary frequency for about 3 days. Has had some dysuria but better, and some pain in left side. PCP is [employer] PA.  Review of Systems Has urinary frequency for about 3 days  Had some dysuria but better Some pain left side at times Denies any fever, nausea or vomiting Has history of kidney stones  Reviewed past medical,surgical, social and family history. Reviewed medications and allergies.     Objective:   Physical Exam BP 132/81 (BP Location: Left Arm, Patient Position: Sitting, Cuff Size: Normal)   Pulse (!) 59   Ht 5\' 2"  (1.575 m)   Wt 172 lb (78 kg)   BMI 31.46 kg/m urine dipstick trace blood, mod leuks, and small protein. Skin warm and dry. Lungs: clear to ausculation bilaterally. Cardiovascular: regular rate and rhythm. No CVAT Tender over bladder     Assessment:       1. Urinary frequency Will rx septra ds   2. Hematuria, unspecified type [place] C&S sent  3. Dysuria Will rx pyridium  4. History of kidney stones Push fluids  Plan:     Follow up prn

## 2019-06-01 LAB — MICROSCOPIC EXAMINATION
Casts: NONE SEEN /lpf
WBC, UA: >30 /hpf — AB (ref 0–5)

## 2019-06-01 LAB — URINALYSIS, ROUTINE W REFLEX MICROSCOPIC
Bilirubin, UA: NEGATIVE
Glucose, UA: NEGATIVE
Ketones, UA: NEGATIVE
Nitrite, UA: NEGATIVE
Protein,UA: NEGATIVE
RBC, UA: NEGATIVE
Specific Gravity, UA: 1.021 (ref 1.005–1.030)
Urobilinogen, Ur: 0.2 mg/dL (ref 0.2–1.0)
pH, UA: 5.5 (ref 5.0–7.5)

## 2019-06-02 LAB — URINE CULTURE

## 2019-09-24 ENCOUNTER — Telehealth: Payer: Self-pay | Admitting: Adult Health

## 2019-09-24 NOTE — Telephone Encounter (Signed)
Telephoned patient at home number and patient states wanted to know if Zoloft helped with mood swings. Advised patient that is why medication was prescribed. Patient complaining of vaginal dryness but is using Replens and getting some relief with that. Patient will continue with this and problem persist will schedule appointment. Patient voiced understanding.

## 2019-09-24 NOTE — Telephone Encounter (Signed)
Pt requesting to speak with Shelby Guerra regarding medication she was prescribed.

## 2019-10-18 DIAGNOSIS — R110 Nausea: Secondary | ICD-10-CM | POA: Diagnosis not present

## 2019-10-18 DIAGNOSIS — R55 Syncope and collapse: Secondary | ICD-10-CM | POA: Diagnosis not present

## 2019-11-18 ENCOUNTER — Other Ambulatory Visit (HOSPITAL_COMMUNITY): Payer: Self-pay | Admitting: Adult Health

## 2019-11-18 DIAGNOSIS — Z1231 Encounter for screening mammogram for malignant neoplasm of breast: Secondary | ICD-10-CM

## 2019-12-02 DIAGNOSIS — Z6832 Body mass index (BMI) 32.0-32.9, adult: Secondary | ICD-10-CM | POA: Diagnosis not present

## 2019-12-02 DIAGNOSIS — J309 Allergic rhinitis, unspecified: Secondary | ICD-10-CM | POA: Diagnosis not present

## 2019-12-02 DIAGNOSIS — K529 Noninfective gastroenteritis and colitis, unspecified: Secondary | ICD-10-CM | POA: Diagnosis not present

## 2019-12-27 ENCOUNTER — Ambulatory Visit (HOSPITAL_COMMUNITY): Payer: BC Managed Care – PPO

## 2019-12-29 ENCOUNTER — Ambulatory Visit (HOSPITAL_COMMUNITY)
Admission: RE | Admit: 2019-12-29 | Discharge: 2019-12-29 | Disposition: A | Discharge status: Home / Self Care | Payer: BC Managed Care – PPO | Source: Ambulatory Visit | Attending: Adult Health | Admitting: Adult Health

## 2019-12-29 ENCOUNTER — Other Ambulatory Visit: Payer: Self-pay

## 2019-12-29 DIAGNOSIS — Z1231 Encounter for screening mammogram for malignant neoplasm of breast: Secondary | ICD-10-CM | POA: Diagnosis not present

## 2020-01-29 ENCOUNTER — Ambulatory Visit
Admission: EM | Admit: 2020-01-29 | Discharge: 2020-01-29 | Disposition: A | Discharge status: Home / Self Care | Payer: BC Managed Care – PPO | Attending: Family Medicine | Admitting: Family Medicine

## 2020-01-29 DIAGNOSIS — M545 Low back pain, unspecified: Secondary | ICD-10-CM

## 2020-01-29 DIAGNOSIS — R300 Dysuria: Secondary | ICD-10-CM | POA: Insufficient documentation

## 2020-01-29 LAB — POCT URINALYSIS DIP (MANUAL ENTRY)
Bilirubin, UA: NEGATIVE
Blood, UA: NEGATIVE
Glucose, UA: NEGATIVE mg/dL
Ketones, POC UA: NEGATIVE mg/dL
Nitrite, UA: NEGATIVE
Protein Ur, POC: NEGATIVE mg/dL
Spec Grav, UA: >=1.03 — AB (ref 1.010–1.025)
Urobilinogen, UA: 0.2 E.U./dL
pH, UA: 5 (ref 5.0–8.0)

## 2020-01-29 MED ORDER — PHENAZOPYRIDINE HCL 200 MG PO TABS: 200.0000 mg | ORAL_TABLET | Freq: Three times a day (TID) | ORAL | 0 refills | Status: DC | PRN

## 2020-01-29 MED ORDER — CEPHALEXIN 500 MG PO CAPS: 500.0000 mg | ORAL_CAPSULE | Freq: Two times a day (BID) | ORAL | 0 refills | Status: AC

## 2020-01-29 NOTE — ED Triage Notes (Signed)
Pt states that she woke up early this morning with dysuria and lower back pain

## 2020-01-29 NOTE — Discharge Instructions (Addendum)
Treating you for a urinary tract infection.  We will call you with your culture results and if anything needs to be changed.  Take the antibiotic as prescribed.  Pyridium as needed for pain.  Make sure you complaining of water, staying hydrated and flushing out the bladder

## 2020-01-29 NOTE — ED Provider Notes (Signed)
[facility]    CSN: [id number] Arrival date & time: 01/29/20  1346      History   Chief Complaint Chief Complaint  Patient presents with  . Dysuria  . Back Pain    HPI Shelby Guerra is a 64 y.o. female.   Patient is a 57-year-old female who presents today for possible UTI.  Reporting she woke up this morning with urinary frequency, dysuria and lower back pain.  Symptoms have been constant.  History of UTI.  Denies any fever, chills, nausea or vomiting.  Denies any vaginal discharge, itching or irritation.     Past Medical History:  Diagnosis Date  . GERD (gastroesophageal reflux disease)   . High cholesterol   . Hot flashes 03/10/2013  . Hypertension    pt denies  . Impaired fasting glucose   . Kidney stone    oxalate/uric acid  . Moody 03/14/2014    Patient Active Problem List   Diagnosis Date Noted  . Hematuria 05/31/2019  . Dysuria 05/31/2019  . History of kidney stones 05/31/2019  . Encounter for well woman exam with routine gynecological exam 05/10/2019  . Urinary frequency 05/10/2019  . Screening for colorectal cancer 05/05/2018  . Left knee pain 05/22/2017  . Well female exam with routine gynecological exam 05/22/2017  . Depression 02/01/2015  . Moody 03/14/2014  . Hot flashes 03/10/2013  . GERD 11/29/2009  . CONSTIPATION, CHRONIC 11/29/2009  . NAUSEA, CHRONIC 11/29/2009    Past Surgical History:  Procedure Laterality Date  . COLONOSCOPY  12/13/2008  . COLONOSCOPY N/A 03/05/2019   Procedure: COLONOSCOPY;  Surgeon: Daneil Dolin, MD;  [facility];  Service: Endoscopy;  Laterality: N/A;  10:30  . INGUINAL HERNIA REPAIR Left 02/26/2006   recurrent  . INGUINAL HERNIA REPAIR Left    prior to 2007  . KNEE ARTHROSCOPY WITH MEDIAL MENISECTOMY Left 12/04/2017   Procedure: KNEE ARTHROSCOPY WITH MEDIAL MENISCECTOMY AND CHONDROPLASTY;  Surgeon: Hiram Gash, MD;  Location: [place];  Service: Orthopedics;   Laterality: Left;  NO BLOCK  . POLYPECTOMY  03/05/2019   Procedure: POLYPECTOMY;  Surgeon: Daneil Dolin, MD;  [facility];  Service: Endoscopy;;  . TOTAL ABDOMINAL HYSTERECTOMY W/ BILATERAL SALPINGOOPHORECTOMY  10/15/2006  . TUBAL LIGATION      OB History    Gravida  1   Para  1   Term      Preterm      AB      Living  1     SAB      TAB      Ectopic      Multiple      Live Births  1            Home Medications    Prior to Admission medications   Medication Sig Start Date End Date Taking? Authorizing Provider  cephALEXin (KEFLEX) 500 MG capsule Take 1 capsule (500 mg total) by mouth 2 (two) times daily for 5 days. 01/29/20 02/03/20  Loura Halt A, NP  Fluocinolone Acetonide Scalp 0.01 % OIL APPLY TO THE SCALP TWICE A DAY AS NEEDED AS NEEDED FOR ITCHING 02/10/19   [provider]  Multiple Vitamin (MULTIVITAMIN) tablet Take 1 tablet by mouth daily.    [provider]  phenazopyridine (PYRIDIUM) 200 MG tablet Take 1 tablet (200 mg total) by mouth 3 (three) times daily as needed for pain. 01/29/20   Orvan July, NP  sertraline (  ZOLOFT) 50 MG tablet Take 1 tablet (50 mg total) by mouth daily. 05/10/19   Estill Dooms, NP    Family History Family History  Problem Relation Age of Onset  . Hypertension Brother   . Kidney failure Brother        had kidney transplant  . Hyperlipidemia Brother   . Hypertension Sister   . Hyperlipidemia Sister   . Cancer Sister        cervical  . Hypertension Brother   . Kidney failure Father   . Cancer Mother   . Colon cancer Neg Hx     Social History Social History   Tobacco Use  . Smoking status: Never Smoker  . Smokeless tobacco: Never Used  Vaping Use  . Vaping Use: Never used  Substance Use Topics  . Alcohol use: No  . Drug use: No     Allergies   Codeine and Propoxyphene n-acetaminophen   Review of Systems Review of Systems   Physical Exam Triage Vital Signs ED  Triage Vitals [01/29/20 1408]  Enc Vitals Group     BP (!) 153/78     Pulse Rate 68     Resp 20     Temp 98 F (36.7 C)     Temp src      SpO2 98 %     Weight      Height      Head Circumference      Peak Flow      Pain Score 8     Pain Loc      Pain Edu?      Excl. in [place]?    No data found.  Updated Vital Signs BP (!) 153/78   Pulse 68   Temp 98 F (36.7 C)   Resp 20   SpO2 98%   Visual Acuity Right Eye Distance:   Left Eye Distance:   Bilateral Distance:    Right Eye Near:   Left Eye Near:    Bilateral Near:     Physical Exam Vitals and nursing note reviewed.  Constitutional:      General: She is not in acute distress.    Appearance: Normal appearance. She is not ill-appearing, toxic-appearing or diaphoretic.  HENT:     Head: Normocephalic.     Nose: Nose normal.  Eyes:     Conjunctiva/sclera: Conjunctivae normal.  Pulmonary:     Effort: Pulmonary effort is normal.  Musculoskeletal:        General: Normal range of motion.     Cervical back: Normal range of motion.  Skin:    General: Skin is warm and dry.     Findings: No rash.  Neurological:     Mental Status: She is alert.  Psychiatric:        Mood and Affect: Mood normal.      UC Treatments / Results  Labs (all labs ordered are listed, but only abnormal results are displayed) Labs Reviewed  POCT URINALYSIS DIP (MANUAL ENTRY) - Abnormal; Notable for the following components:      Result Value   Spec Grav, UA >=1.030 (*)    Leukocytes, UA Trace (*)    All other components within normal limits  URINE CULTURE    EKG   Radiology No results found.  Procedures Procedures (including critical care time)  Medications Ordered in UC Medications - No data to display  Initial Impression / Assessment and Plan / UC Course  I have reviewed the triage  vital signs and the nursing notes.  Pertinent labs & imaging results that were available during my care of the patient were reviewed by me and  considered in my medical decision making (see chart for details).     Dysuria Urine with trace leuks.  Sending for culture. Patient requesting antibiotics until culture returns.  We will go ahead and start on Keflex.  Pyridium as needed for pain.  Recommended push fluids Follow up as needed for continued or worsening symptoms  Final Clinical Impressions(s) / UC Diagnoses   Final diagnoses:  Dysuria  Acute left-sided low back pain without sciatica     Discharge Instructions     Treating you for a urinary tract infection.  We will call you with your culture results and if anything needs to be changed.  Take the antibiotic as prescribed.  Pyridium as needed for pain.  Make sure you complaining of water, staying hydrated and flushing out the bladder    ED Prescriptions    Medication Sig Dispense Auth. Provider   phenazopyridine (PYRIDIUM) 200 MG tablet Take 1 tablet (200 mg total) by mouth 3 (three) times daily as needed for pain. 10 tablet Lincoln Kleiner A, NP   cephALEXin (KEFLEX) 500 MG capsule Take 1 capsule (500 mg total) by mouth 2 (two) times daily for 5 days. 10 capsule Loura Halt A, NP     PDMP not reviewed this encounter.   Orvan July, NP 01/29/20 1511

## 2020-01-31 LAB — URINE CULTURE: Culture: <10000 — AB

## 2020-02-04 ENCOUNTER — Telehealth (HOSPITAL_COMMUNITY): Payer: Self-pay | Admitting: Emergency Medicine

## 2020-02-04 MED ORDER — FLUCONAZOLE 150 MG PO TABS: 150.0000 mg | ORAL_TABLET | Freq: Once | ORAL | 0 refills | Status: AC

## 2020-04-17 ENCOUNTER — Emergency Department (HOSPITAL_COMMUNITY)
Admission: EM | Admit: 2020-04-17 | Discharge: 2020-04-18 | Disposition: A | Discharge status: Home / Self Care | Payer: BC Managed Care – PPO | Attending: Emergency Medicine | Admitting: Emergency Medicine

## 2020-04-17 ENCOUNTER — Encounter (HOSPITAL_COMMUNITY): Payer: Self-pay

## 2020-04-17 ENCOUNTER — Other Ambulatory Visit: Payer: Self-pay

## 2020-04-17 DIAGNOSIS — I10 Essential (primary) hypertension: Secondary | ICD-10-CM | POA: Diagnosis not present

## 2020-04-17 DIAGNOSIS — K0889 Other specified disorders of teeth and supporting structures: Secondary | ICD-10-CM | POA: Diagnosis not present

## 2020-04-17 DIAGNOSIS — Z79899 Other long term (current) drug therapy: Secondary | ICD-10-CM | POA: Insufficient documentation

## 2020-04-17 MED ORDER — KETOROLAC TROMETHAMINE 60 MG/2ML IM SOLN
30.0000 mg | Freq: Once | INTRAMUSCULAR | Status: AC
  Administered 2020-04-18: 30 mg via INTRAMUSCULAR
  Filled 2020-04-17: qty 2

## 2020-04-17 MED ORDER — OXYCODONE-ACETAMINOPHEN 5-325 MG PO TABS
1.0000 | ORAL_TABLET | Freq: Once | ORAL | Status: AC
  Administered 2020-04-18: 1 via ORAL
  Filled 2020-04-17: qty 1

## 2020-04-17 NOTE — ED Triage Notes (Signed)
Pt presents to ED with complaints of right lower dental pain x few days, denies fevers

## 2020-04-17 NOTE — ED Provider Notes (Signed)
[facility] EMERGENCY DEPARTMENT Provider Note   CSN: [id number] Arrival date & time: 04/17/20  2157     History Chief Complaint  Patient presents with  . Dental Pain    [place] is a 64 y.o. female.   Dental Pain Location:  Lower Lower teeth location:  20/LL 2nd bicuspid Quality:  Aching and dull Severity:  Mild Onset quality:  Gradual Duration:  6 days Timing:  Constant Progression:  Worsening Chronicity:  New Context: not abscess and not dental fracture        Past Medical History:  Diagnosis Date  . GERD (gastroesophageal reflux disease)   . High cholesterol   . Hot flashes 03/10/2013  . Hypertension    pt denies  . Impaired fasting glucose   . Kidney stone    oxalate/uric acid  . Moody 03/14/2014    Patient Active Problem List   Diagnosis Date Noted  . Hematuria 05/31/2019  . Dysuria 05/31/2019  . History of kidney stones 05/31/2019  . Encounter for well woman exam with routine gynecological exam 05/10/2019  . Urinary frequency 05/10/2019  . Screening for colorectal cancer 05/05/2018  . Left knee pain 05/22/2017  . Well female exam with routine gynecological exam 05/22/2017  . Depression 02/01/2015  . Moody 03/14/2014  . Hot flashes 03/10/2013  . GERD 11/29/2009  . CONSTIPATION, CHRONIC 11/29/2009  . NAUSEA, CHRONIC 11/29/2009    Past Surgical History:  Procedure Laterality Date  . COLONOSCOPY  12/13/2008  . COLONOSCOPY N/A 03/05/2019   Procedure: COLONOSCOPY;  Surgeon: Daneil Dolin, MD;  Location: [facility];  Service: Endoscopy;  Laterality: N/A;  10:30  . INGUINAL HERNIA REPAIR Left 02/26/2006   recurrent  . INGUINAL HERNIA REPAIR Left    prior to 2007  . KNEE ARTHROSCOPY WITH MEDIAL MENISECTOMY Left 12/04/2017   Procedure: KNEE ARTHROSCOPY WITH MEDIAL MENISCECTOMY AND CHONDROPLASTY;  Surgeon: Hiram Gash, MD;  Location: [place];  Service: Orthopedics;  Laterality: Left;  NO BLOCK  . POLYPECTOMY  03/05/2019    Procedure: POLYPECTOMY;  Surgeon: Daneil Dolin, MD;  Location: [facility];  Service: Endoscopy;;  . TOTAL ABDOMINAL HYSTERECTOMY W/ BILATERAL SALPINGOOPHORECTOMY  10/15/2006  . TUBAL LIGATION       OB History    Gravida  1   Para  1   Term      Preterm      AB      Living  1     SAB      TAB      Ectopic      Multiple      Live Births  1           Family History  Problem Relation Age of Onset  . Hypertension Brother   . Kidney failure Brother        had kidney transplant  . Hyperlipidemia Brother   . Hypertension Sister   . Hyperlipidemia Sister   . Cancer Sister        cervical  . Hypertension Brother   . Kidney failure Father   . Cancer Mother   . Colon cancer Neg Hx     Social History   Tobacco Use  . Smoking status: Never Smoker  . Smokeless tobacco: Never Used  Vaping Use  . Vaping Use: Never used  Substance Use Topics  . Alcohol use: No  . Drug use: No    Home Medications Prior to Admission medications  Medication Sig Start Date End Date Taking? Authorizing Provider  Fluocinolone Acetonide Scalp 0.01 % OIL APPLY TO THE SCALP TWICE A DAY AS NEEDED AS NEEDED FOR ITCHING 02/10/19   [provider]  Multiple Vitamin (MULTIVITAMIN) tablet Take 1 tablet by mouth daily.    [provider]  phenazopyridine (PYRIDIUM) 200 MG tablet Take 1 tablet (200 mg total) by mouth 3 (three) times daily as needed for pain. 01/29/20   Loura Halt A, NP  sertraline (ZOLOFT) 50 MG tablet Take 1 tablet (50 mg total) by mouth daily. 05/10/19   Estill Dooms, NP    Allergies    Codeine and Propoxyphene n-acetaminophen  Review of Systems   Review of Systems  All other systems reviewed and are negative.   Physical Exam Updated Vital Signs BP (!) 154/78 (BP Location: Right Arm)   Pulse (!) 50   Temp 97.8 F (36.6 C) (Oral)   Resp 18   Ht 5\' 2"  (1.575 m)   Wt 81.6 kg   SpO2 100%   BMI 32.92 kg/m   Physical Exam Vitals  and nursing note reviewed.  Constitutional:      Appearance: She is well-developed.  HENT:     Head: Normocephalic and atraumatic.     Mouth/Throat:     Mouth: Mucous membranes are moist.     Pharynx: Oropharynx is clear.     Comments: No edema, erythema, fluctuance. Mild gingivitis.  Eyes:     Pupils: Pupils are equal, round, and reactive to light.  Cardiovascular:     Rate and Rhythm: Normal rate and regular rhythm.  Pulmonary:     Effort: No respiratory distress.     Breath sounds: No stridor.  Abdominal:     General: Abdomen is flat. There is no distension.  Musculoskeletal:        General: No swelling or tenderness. Normal range of motion.     Cervical back: Normal range of motion.  Skin:    General: Skin is warm and dry.  Neurological:     General: No focal deficit present.     Mental Status: She is alert.     ED Results / Procedures / Treatments   Labs (all labs ordered are listed, but only abnormal results are displayed) Labs Reviewed - No data to display  EKG None  Radiology No results found.  Procedures Procedures (including critical care time)  Medications Ordered in ED Medications  ketorolac (TORADOL) injection 30 mg (has no administration in time range)  oxyCODONE-acetaminophen (PERCOCET/ROXICET) 5-325 MG per tablet 1 tablet (has no administration in time range)    ED Course  I have reviewed the triage vital signs and the nursing notes.  Pertinent labs & imaging results that were available during my care of the patient were reviewed by me and considered in my medical decision making (see chart for details).    MDM Rules/Calculators/A&P                          Needs root canal (per patient) pain was bad. Left work and came here. No obvious infection to require antibiotics. No sublingual or neck edema/pain to suggest deep space infection.  Pain treated. Denied dental block.  Dental follow up.  Final Clinical Impression(s) / ED Diagnoses Final  diagnoses:  Pain, dental    Rx / DC Orders ED Discharge Orders    None       Jahni Nazar, Corene Cornea, MD 04/17/20 2352

## 2020-04-17 NOTE — ED Notes (Signed)
Report given to Fabio Neighbors, RN

## 2020-04-24 DIAGNOSIS — Z0000 Encounter for general adult medical examination without abnormal findings: Secondary | ICD-10-CM | POA: Diagnosis not present

## 2020-04-24 DIAGNOSIS — Z6832 Body mass index (BMI) 32.0-32.9, adult: Secondary | ICD-10-CM | POA: Diagnosis not present

## 2020-04-24 DIAGNOSIS — Z23 Encounter for immunization: Secondary | ICD-10-CM | POA: Diagnosis not present

## 2020-05-18 ENCOUNTER — Encounter: Payer: Self-pay | Admitting: Adult Health

## 2020-05-18 ENCOUNTER — Other Ambulatory Visit: Payer: Self-pay

## 2020-05-18 ENCOUNTER — Ambulatory Visit (INDEPENDENT_AMBULATORY_CARE_PROVIDER_SITE_OTHER): Payer: BC Managed Care – PPO | Admitting: Adult Health

## 2020-05-18 VITALS — BP 132/82 | HR 60 | Ht 62.0 in | Wt 183.0 lb

## 2020-05-18 DIAGNOSIS — Z1211 Encounter for screening for malignant neoplasm of colon: Secondary | ICD-10-CM

## 2020-05-18 DIAGNOSIS — R4589 Other symptoms and signs involving emotional state: Secondary | ICD-10-CM

## 2020-05-18 DIAGNOSIS — Z01419 Encounter for gynecological examination (general) (routine) without abnormal findings: Secondary | ICD-10-CM

## 2020-05-18 LAB — HEMOCCULT GUIAC POC 1CARD (OFFICE): Fecal Occult Blood, POC: NEGATIVE

## 2020-05-18 MED ORDER — CENTRUM SILVER 50+WOMEN PO TABS: ORAL_TABLET | ORAL | Status: DC

## 2020-05-18 MED ORDER — SERTRALINE HCL 50 MG PO TABS: 50.0000 mg | ORAL_TABLET | Freq: Every day | ORAL | 12 refills | Status: DC

## 2020-05-18 NOTE — Progress Notes (Addendum)
Patient ID: Shelby Guerra, female   DOB: 1956-05-06, 64 y.o.   MRN: [id number] History of Present Illness: Shelby Guerra is a 64 year old black female,single,sp hysterectomy in for a well woman gyn exam. She is still working, 12 hours at night. She has gotten her COVID booster. PCP is Denny Levy PA.   Current Medications, Allergies, Past Medical History, Past Surgical History, Family History and Social History were reviewed in [employer] record.     Review of Systems:  Patient denies any headaches, hearing loss, blurred vision, shortness of breath, chest pain, abdominal pain, problems with bowel movements, urination, or intercourse(not active). No joint pain or mood swings. Tired at times.  Physical Exam:BP 132/82 (BP Location: Left Arm, Patient Position: Sitting, Cuff Size: Normal)   Pulse 60   Ht 5\' 2"  (1.575 m)   Wt 183 lb (83 kg)   BMI 33.47 kg/m  General:  Well developed, well nourished, no acute distress Skin:  Warm and dry Neck:  Midline trachea, normal thyroid, good ROM, no lymphadenopathy,no carotid bruits heard Lungs; Clear to auscultation bilaterally Breast:  No dominant palpable mass, retraction, or nipple discharge,lef tnipple chronically  inverted Cardiovascular: Regular rate and rhythm Abdomen:  Soft, non tender, no hepatosplenomegaly Pelvic:  External genitalia is normal in appearance, no lesions.  The vagina is pale with loss of moisture and rugae. Urethra has no lesions or masses. The cervix and uterus are absent.  No adnexal masses or tenderness noted.Bladder is non tender, no masses felt. Rectal: Good sphincter tone, no polyps, or hemorrhoids felt.  Hemoccult negative. Extremities/musculoskeletal:  No swelling or varicosities noted, no clubbing or cyanosis Psych:  No mood changes, alert and cooperative,seems happy AA is 0 Fall risk is low PHQ 9 score is 0, is on zoloft GAD 7 score is 0.  [facility] - 05/18/20 0837      Pregnancy Intention  Screening   Does the patient want to become pregnant in the next year? N/A    Does the patient's partner want to become pregnant in the next year? N/A    Would the patient like to discuss contraceptive options today? N/A      Contraception Wrap Up   Current Method Female Sterilization    End Method Female Sterilization    Contraception Counseling Provided No         Examination chaperoned by [facility] LPN  Impression and plan: 1. Encounter for well woman exam with routine gynecological exam Physical in 1 year Labs with PCP Mammogram yearly Colonoscopy per GI Try centrum silver for women 50+ to see if helps with being tired   2. Encounter for screening fecal occult blood testing  3. Moody Continue zoloft  Meds ordered this encounter  Medications  . sertraline (ZOLOFT) 50 MG tablet    Sig: Take 1 tablet (50 mg total) by mouth daily.    Dispense:  30 tablet    Refill:  12    Order Specific Question:   Supervising Provider    Answer:   Elonda Husky, LUTHER H [[id number]]  . Multiple Vitamins-Minerals (CENTRUM SILVER 50+WOMEN) TABS    Sig: Take 1 ddaily    Order Specific Question:   Supervising Provider    Answer:   Tania Ade H [[id number]]

## 2020-11-27 ENCOUNTER — Other Ambulatory Visit (HOSPITAL_COMMUNITY): Payer: Self-pay | Admitting: Adult Health

## 2020-11-27 DIAGNOSIS — Z1231 Encounter for screening mammogram for malignant neoplasm of breast: Secondary | ICD-10-CM

## 2020-12-29 ENCOUNTER — Other Ambulatory Visit: Payer: Self-pay

## 2020-12-29 MED ORDER — SERTRALINE HCL 50 MG PO TABS: 50.0000 mg | ORAL_TABLET | Freq: Every day | ORAL | 0 refills | Status: DC

## 2021-01-01 ENCOUNTER — Ambulatory Visit (HOSPITAL_COMMUNITY): Payer: BC Managed Care – PPO

## 2021-01-14 IMAGING — DX DG FINGER THUMB 2+V*R*
3 series · 3 of 3 positions shown · non-contrast
Comparison: None.

CLINICAL DATA: Continued pain at the base of the right thumb after
slamming it in the car door 2 months ago.

EXAM:
RIGHT THUMB 2+V

[finger ap]
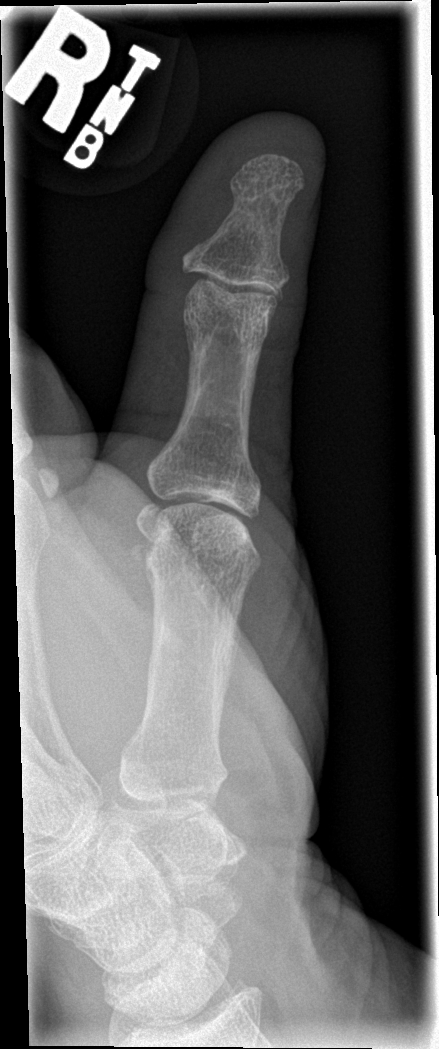

[finger obl]
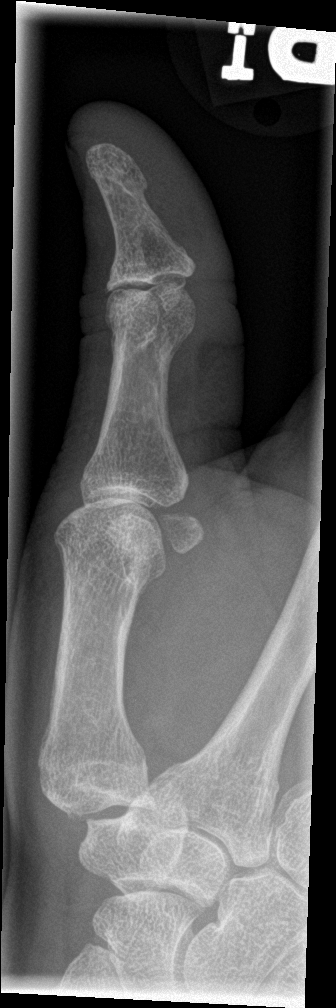

[finger lat]
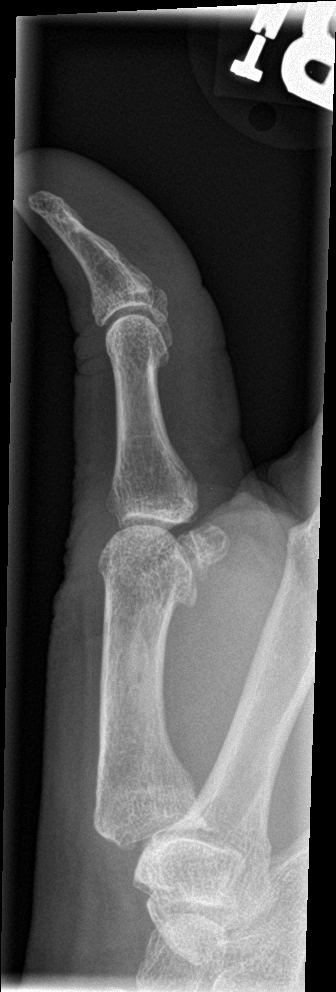

[3 of 3 positions shown; findings below may reference images not displayed]

FINDINGS: There is no evidence of fracture or dislocation. There is no
evidence of arthropathy or other focal bone abnormality. Soft
tissues are unremarkable.
IMPRESSION: Negative.

## 2021-01-18 ENCOUNTER — Other Ambulatory Visit: Payer: Self-pay

## 2021-01-18 ENCOUNTER — Ambulatory Visit (HOSPITAL_COMMUNITY)
Admission: RE | Admit: 2021-01-18 | Discharge: 2021-01-18 | Disposition: A | Discharge status: Home / Self Care | Payer: BC Managed Care – PPO | Source: Ambulatory Visit | Attending: Adult Health | Admitting: Adult Health

## 2021-01-18 DIAGNOSIS — Z1231 Encounter for screening mammogram for malignant neoplasm of breast: Secondary | ICD-10-CM | POA: Insufficient documentation

## 2021-04-11 IMAGING — DX DG WRIST COMPLETE 3+V*R*
4 series · 4 of 4 positions shown · non-contrast
Comparison: None.

CLINICAL DATA: Fall

EXAM:
RIGHT WRIST - COMPLETE 3+ VIEW

[wrist pa]
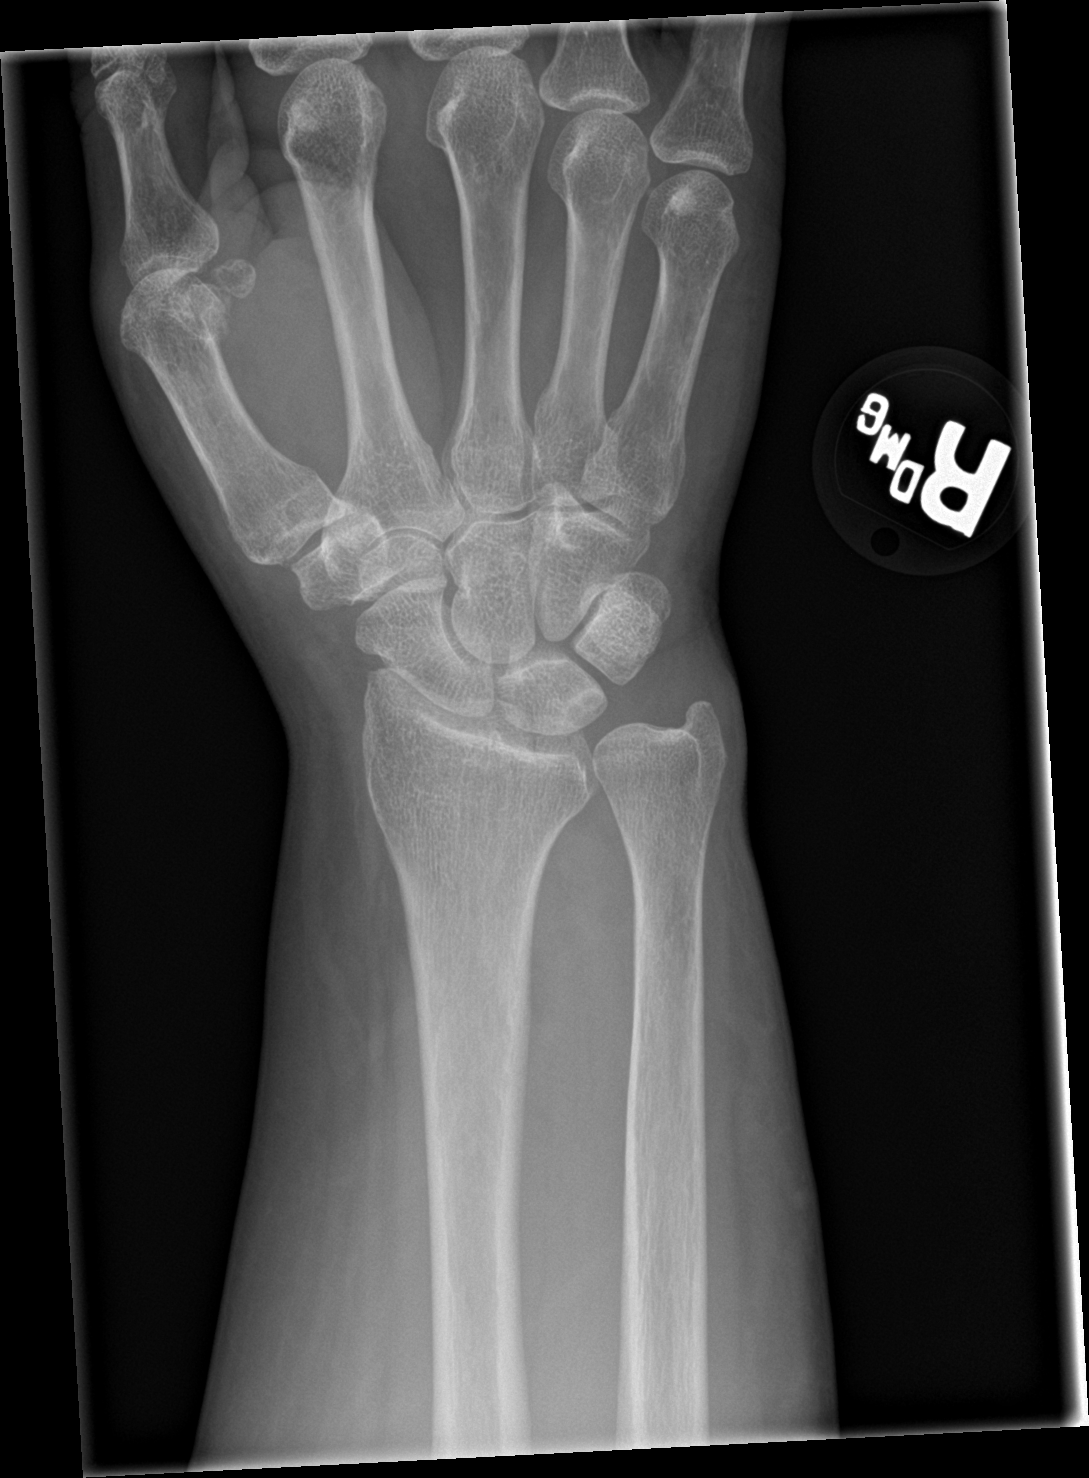

[wrist obl]
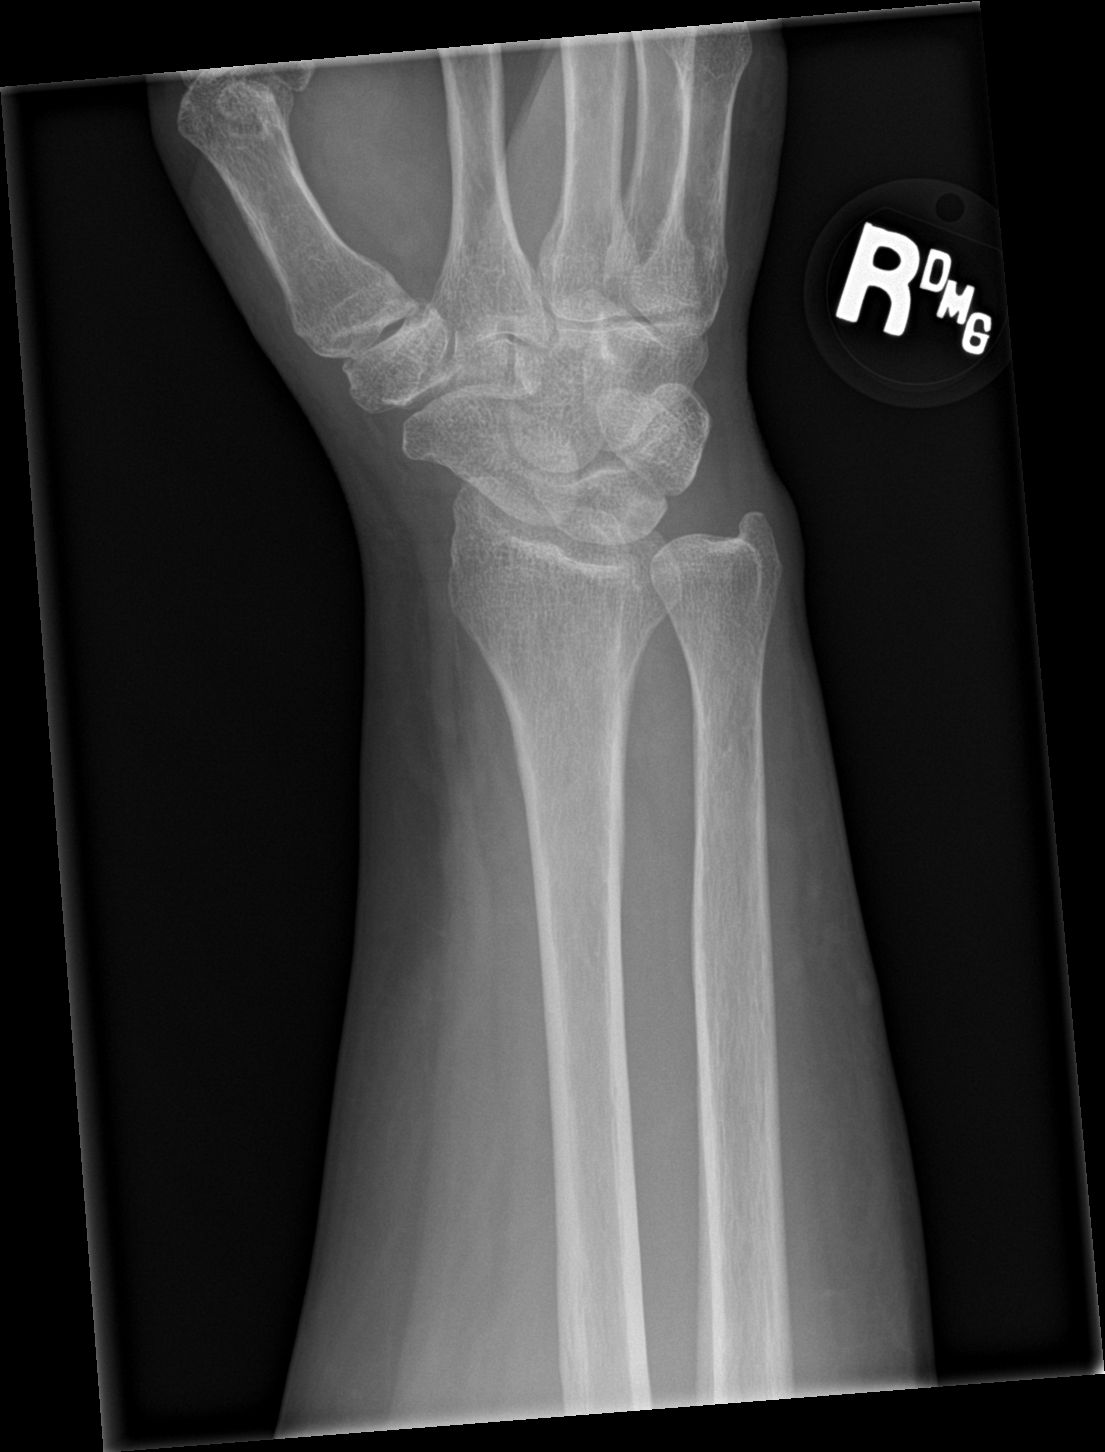

[wrist lat]
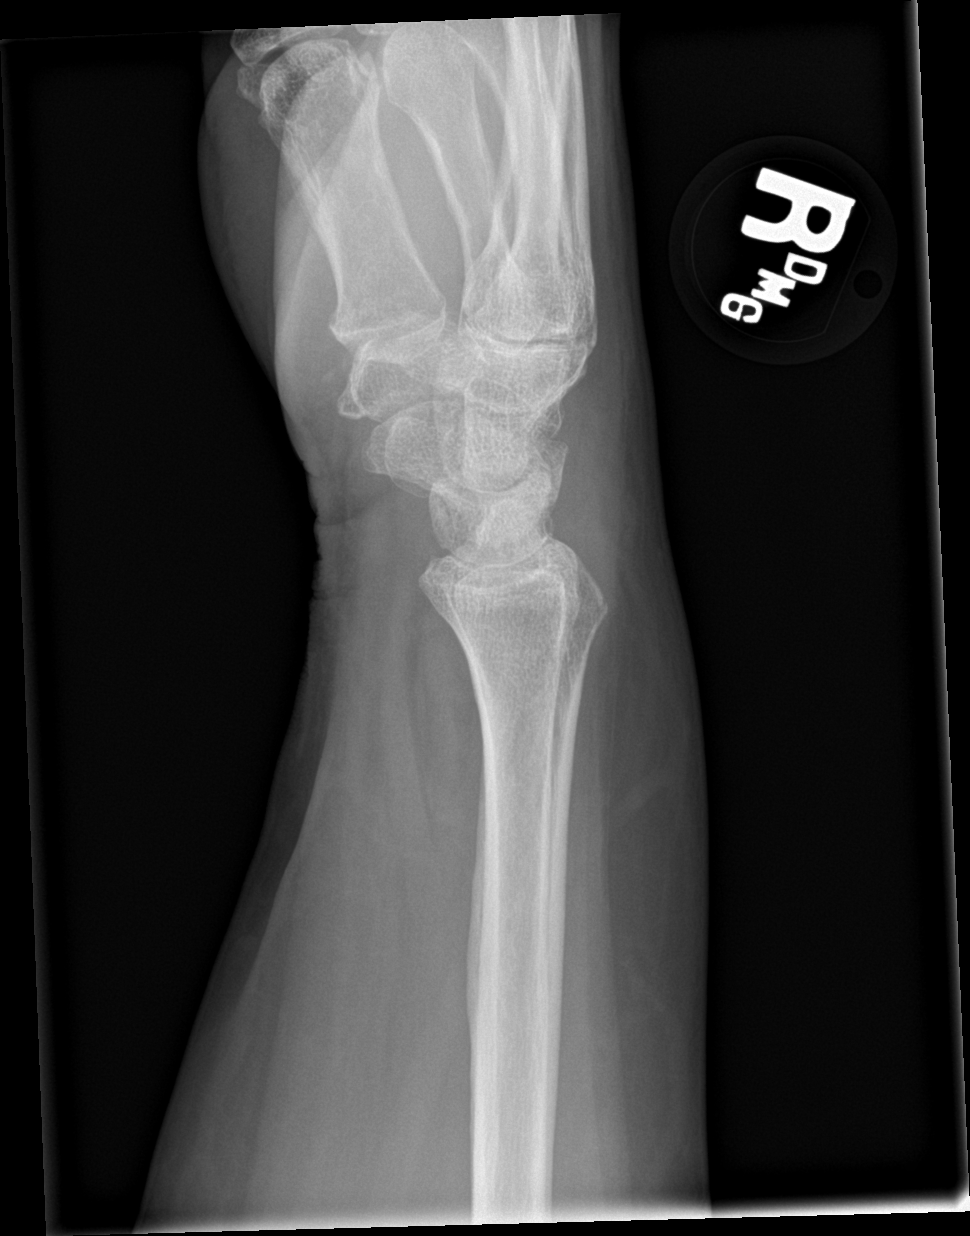

[wrist navicular]
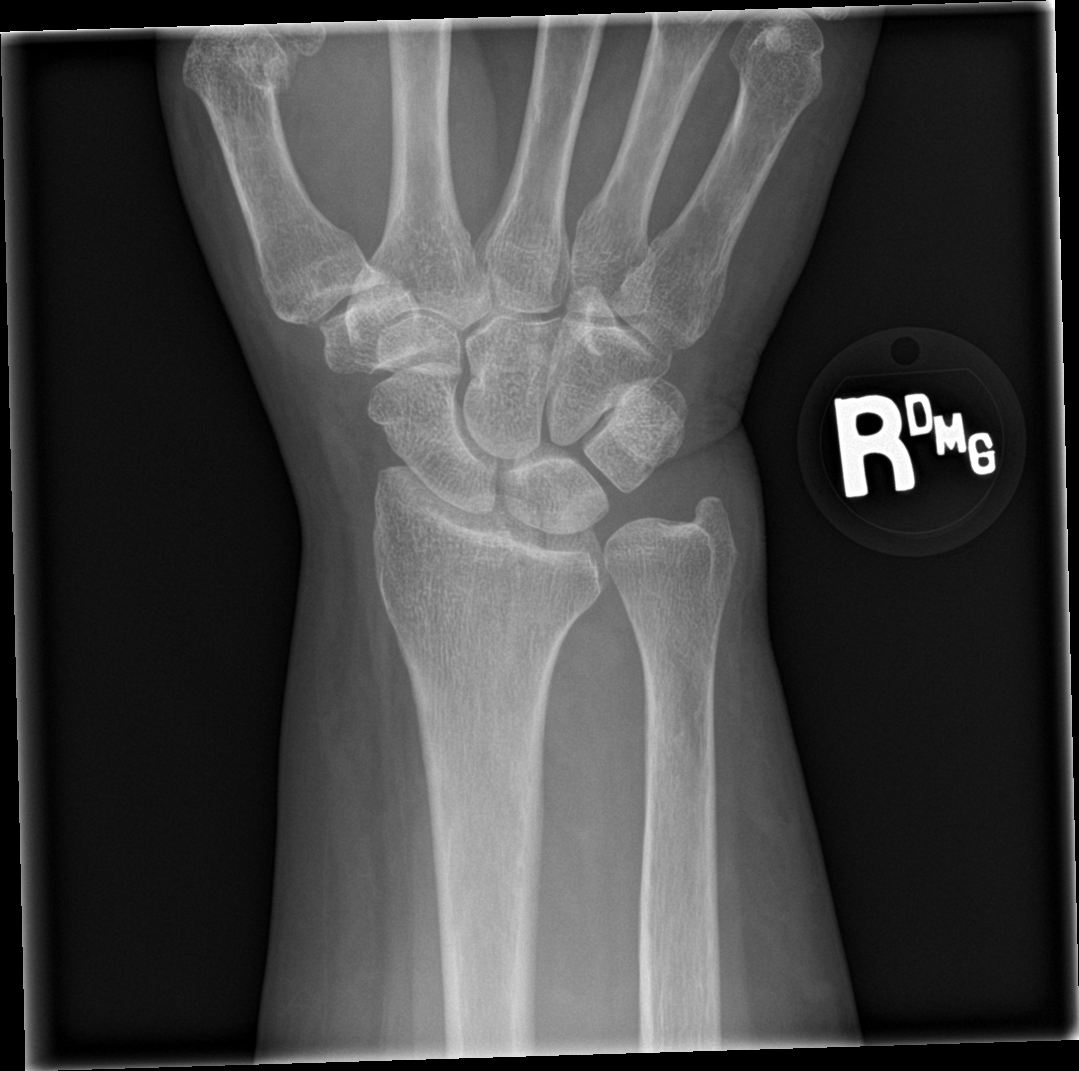

[4 of 4 positions shown; findings below may reference images not displayed]

FINDINGS: There is no evidence of fracture or dislocation. There is no
evidence of arthropathy or other focal bone abnormality. Soft
tissues are unremarkable.
IMPRESSION: Negative.

## 2021-05-26 ENCOUNTER — Other Ambulatory Visit: Payer: Self-pay | Admitting: Women's Health

## 2021-05-28 ENCOUNTER — Other Ambulatory Visit: Payer: Self-pay | Admitting: Adult Health

## 2021-06-12 ENCOUNTER — Other Ambulatory Visit: Payer: Self-pay

## 2021-06-12 ENCOUNTER — Encounter: Payer: Self-pay | Admitting: Adult Health

## 2021-06-12 ENCOUNTER — Ambulatory Visit (INDEPENDENT_AMBULATORY_CARE_PROVIDER_SITE_OTHER): Payer: BC Managed Care – PPO | Admitting: Adult Health

## 2021-06-12 VITALS — BP 143/73 | HR 58 | Ht 62.0 in | Wt 183.6 lb

## 2021-06-12 DIAGNOSIS — Z90710 Acquired absence of both cervix and uterus: Secondary | ICD-10-CM | POA: Insufficient documentation

## 2021-06-12 DIAGNOSIS — F32A Depression, unspecified: Secondary | ICD-10-CM | POA: Diagnosis not present

## 2021-06-12 DIAGNOSIS — Z13220 Encounter for screening for lipoid disorders: Secondary | ICD-10-CM | POA: Diagnosis not present

## 2021-06-12 DIAGNOSIS — Z1211 Encounter for screening for malignant neoplasm of colon: Secondary | ICD-10-CM | POA: Diagnosis not present

## 2021-06-12 DIAGNOSIS — Z01419 Encounter for gynecological examination (general) (routine) without abnormal findings: Secondary | ICD-10-CM

## 2021-06-12 LAB — HEMOCCULT GUIAC POC 1CARD (OFFICE): Fecal Occult Blood, POC: NEGATIVE

## 2021-06-12 MED ORDER — PANTOPRAZOLE SODIUM 20 MG PO TBEC: 20.0000 mg | DELAYED_RELEASE_TABLET | Freq: Every day | ORAL | 6 refills | Status: DC

## 2021-06-12 NOTE — Progress Notes (Addendum)
Patient ID: Shelby Guerra, female   DOB: [birth year], 66 y.o.   MRN: [id number] History of Present Illness: Shelby Guerra is a 66 year old black female,single, sp hysterectomy in for a well woman gyn exam and requests labs. PCP is Doyce Loose PA.    Current Medications, Allergies, Past Medical History, Past Surgical History, Family History and Social History were reviewed in [employer] record.     Review of Systems: Patient denies any headaches, hearing loss, fatigue, blurred vision, shortness of breath, chest pain, abdominal pain, problems with bowel movements, urination, or intercourse. (Not active). No joint pain or mood swings.  Has some indigestion, feels it between breasts,will try Protonix    Physical Exam:BP (!) 143/73 (BP Location: Right Arm, Patient Position: Sitting, Cuff Size: Normal)    Pulse (!) 58    Ht 5\' 2"  (1.575 m)    Wt 183 lb 9.6 oz (83.3 kg)    BMI 33.58 kg/m   General:  Well developed, well nourished, no acute distress Skin:  Warm and dry Neck:  Midline trachea, normal thyroid, good ROM, no lymphadenopathy,no carotid bruits heard Lungs; Clear to auscultation bilaterally Breast:  No dominant palpable mass, retraction, or nipple discharge Cardiovascular: Regular rate and rhythm Abdomen:  Soft, non tender, no hepatosplenomegaly Pelvic:  External genitalia is normal in appearance, no lesions.  The vagina is normal in appearance. Urethra has no lesions or masses. The cervix and uterus are absent. No adnexal masses or tenderness noted.Bladder is non tender, no masses felt. Rectal: Good sphincter tone, no polyps, or hemorrhoids felt.  Hemoccult negative. Extremities/musculoskeletal:  No swelling or varicosities noted, no clubbing or cyanosis Psych:  No mood changes, alert and cooperative,seems happy AA is 0 Fall risk is low Depression screen [facility] 2/9 06/12/2021 05/18/2020 05/10/2019  Decreased Interest 3 0 0  Down, Depressed, Hopeless 0 0 0  PHQ - 2 Score  3 0 0  Altered sleeping 2 0 -  Tired, decreased energy 1 0 -  Change in appetite 0 0 -  Feeling bad or failure about yourself  0 0 -  Trouble concentrating 1 0 -  Moving slowly or fidgety/restless 0 0 -  Suicidal thoughts 0 0 -  PHQ-9 Score 7 0 -    GAD 7 : Generalized Anxiety Score 06/12/2021 05/18/2020  Nervous, Anxious, on Edge 0 0  Control/stop worrying 0 0  Worry too much - different things 0 0  Trouble relaxing 0 0  Restless 0 0  Easily annoyed or irritable 0 0  Afraid - awful might happen 0 0  Total GAD 7 Score 0 0     Examination chaperoned by Diona Fanti CMA.   Impression and Plan:  1. Encounter for well woman exam with routine gynecological exam Will check fasting labs - CBC - Comprehensive metabolic panel Mammogram was normal in August 2022 Colonoscopy per GI  2. Encounter for screening fecal occult blood testing  - POCT occult blood stool  3. Depression, unspecified depression type Continue Zoloft 50 mg dailym has refills   4. Screening cholesterol level - Lipid panel  5. S/P hysterectomy   Meds ordered this encounter  Medications   pantoprazole (PROTONIX) 20 MG tablet    Sig: Take 1 tablet (20 mg total) by mouth daily.    Dispense:  30 tablet    Refill:  6    Order Specific Question:   Supervising Provider    Answer:   Tania Ade H [[id number]]

## 2021-06-13 LAB — COMPREHENSIVE METABOLIC PANEL
ALT: 11 IU/L (ref 0–32)
AST: 18 IU/L (ref 0–40)
Albumin/Globulin Ratio: 1.8 (ref 1.2–2.2)
Albumin: 4.3 g/dL (ref 3.8–4.8)
Alkaline Phosphatase: 96 IU/L (ref 44–121)
BUN/Creatinine Ratio: 22 (ref 12–28)
BUN: 17 mg/dL (ref 8–27)
Bilirubin Total: <0.2 mg/dL (ref 0.0–1.2)
CO2: 25 mmol/L (ref 20–29)
Calcium: 9.3 mg/dL (ref 8.7–10.3)
Chloride: 102 mmol/L (ref 96–106)
Creatinine, Ser: 0.78 mg/dL (ref 0.57–1.00)
Globulin, Total: 2.4 g/dL (ref 1.5–4.5)
Glucose: 96 mg/dL (ref 70–99)
Potassium: 4 mmol/L (ref 3.5–5.2)
Sodium: 142 mmol/L (ref 134–144)
Total Protein: 6.7 g/dL (ref 6.0–8.5)
eGFR: 85 mL/min/{1.73_m2} (ref >59)

## 2021-06-13 LAB — LIPID PANEL
Chol/HDL Ratio: 4.3 ratio (ref 0.0–4.4)
Cholesterol, Total: 206 mg/dL — ABNORMAL HIGH (ref 100–199)
HDL: 48 mg/dL (ref >39)
LDL Chol Calc (NIH): 146 mg/dL — ABNORMAL HIGH (ref 0–99)
Triglycerides: 66 mg/dL (ref 0–149)
VLDL Cholesterol Cal: 12 mg/dL (ref 5–40)

## 2021-06-13 LAB — CBC
Hematocrit: 38.7 % (ref 34.0–46.6)
Hemoglobin: 13.4 g/dL (ref 11.1–15.9)
MCH: 28.9 pg (ref 26.6–33.0)
MCHC: 34.6 g/dL (ref 31.5–35.7)
MCV: 83 fL (ref 79–97)
Platelets: 213 10*3/uL (ref 150–450)
RBC: 4.64 x10E6/uL (ref 3.77–5.28)
RDW: 11.9 % (ref 11.7–15.4)
WBC: 5.6 10*3/uL (ref 3.4–10.8)

## 2021-08-06 ENCOUNTER — Ambulatory Visit (HOSPITAL_COMMUNITY)
Admission: RE | Admit: 2021-08-06 | Discharge: 2021-08-06 | Disposition: A | Discharge status: Home / Self Care | Payer: BC Managed Care – PPO | Source: Ambulatory Visit | Attending: Urology | Admitting: Urology

## 2021-08-06 ENCOUNTER — Ambulatory Visit (INDEPENDENT_AMBULATORY_CARE_PROVIDER_SITE_OTHER): Payer: BC Managed Care – PPO | Admitting: Urology

## 2021-08-06 ENCOUNTER — Other Ambulatory Visit: Payer: Self-pay

## 2021-08-06 ENCOUNTER — Encounter: Payer: Self-pay | Admitting: Urology

## 2021-08-06 VITALS — BP 152/80 | HR 59 | Wt 187.0 lb

## 2021-08-06 DIAGNOSIS — R1032 Left lower quadrant pain: Secondary | ICD-10-CM

## 2021-08-06 DIAGNOSIS — R109 Unspecified abdominal pain: Secondary | ICD-10-CM

## 2021-08-06 DIAGNOSIS — Z87442 Personal history of urinary calculi: Secondary | ICD-10-CM | POA: Diagnosis not present

## 2021-08-06 LAB — URINALYSIS, ROUTINE W REFLEX MICROSCOPIC
Bilirubin, UA: NEGATIVE
Glucose, UA: NEGATIVE
Ketones, UA: NEGATIVE
Nitrite, UA: NEGATIVE
Protein,UA: NEGATIVE
RBC, UA: NEGATIVE
Specific Gravity, UA: <1.005 — ABNORMAL LOW (ref 1.005–1.030)
Urobilinogen, Ur: 0.2 mg/dL (ref 0.2–1.0)
pH, UA: 6 (ref 5.0–7.5)

## 2021-08-06 LAB — MICROSCOPIC EXAMINATION
RBC, Urine: NONE SEEN /hpf (ref 0–2)
Renal Epithel, UA: NONE SEEN /hpf

## 2021-08-06 NOTE — Patient Instructions (Signed)
Dietary Guidelines to Help Prevent Kidney Stones Kidney stones are deposits of minerals and salts that form inside your kidneys. Your risk of developing kidney stones may be greater depending on your diet, your lifestyle, the medicines you take, and whether you have certain medical conditions. Most people can lower their chances of developing kidney stones by following the instructions below. Your dietitian may give you more specific instructions depending on your overall health and the type of kidney stones you tend to develop. What are tips for following this plan? Reading food labels  Choose foods with "no salt added" or "low-salt" labels. Limit your salt (sodium) intake to less than 1,500 mg a day. Choose foods with calcium for each meal and snack. Try to eat about 300 mg of calcium at each meal. Foods that contain 200-500 mg of calcium a serving include: 8 oz (237 mL) of milk, calcium-fortifiednon-dairy milk, and calcium-fortifiedfruit juice. Calcium-fortified means that calcium has been added to these drinks. 8 oz (237 mL) of kefir, yogurt, and soy yogurt. 4 oz (114 g) of tofu. 1 oz (28 g) of cheese. 1 cup (150 g) of dried figs. 1 cup (91 g) of cooked broccoli. One 3 oz (85 g) can of sardines or mackerel. Most people need 1,000-1,500 mg of calcium a day. Talk to your dietitian about how much calcium is recommended for you. Shopping Buy plenty of fresh fruits and vegetables. Most people do not need to avoid fruits and vegetables, even if these foods contain nutrients that may contribute to kidney stones. When shopping for convenience foods, choose: Whole pieces of fruit. Pre-made salads with dressing on the side. Low-fat fruit and yogurt smoothies. Avoid buying frozen meals or prepared deli foods. These can be high in sodium. Look for foods with live cultures, such as yogurt and kefir. Choose high-fiber grains, such as whole-wheat breads, oat bran, and wheat cereals. Cooking Do not add  salt to food when cooking. Place a salt shaker on the table and allow each person to add his or her own salt to taste. Use vegetable protein, such as beans, textured vegetable protein (TVP), or tofu, instead of meat in pasta, casseroles, and soups. Meal planning Eat less salt, if told by your dietitian. To do this: Avoid eating processed or pre-made food. Avoid eating fast food. Eat less animal protein, including cheese, meat, poultry, or fish, if told by your dietitian. To do this: Limit the number of times you have meat, poultry, fish, or cheese each week. Eat a diet free of meat at least 2 days a week. Eat only one serving each day of meat, poultry, fish, or seafood. When you prepare animal protein, cut pieces into small portion sizes. For most meat and fish, one serving is about the size of the palm of your hand. Eat at least five servings of fresh fruits and vegetables each day. To do this: Keep fruits and vegetables on hand for snacks. Eat one piece of fruit or a handful of berries with breakfast. Have a salad and fruit at lunch. Have two kinds of vegetables at dinner. Limit foods that are high in a substance called oxalate. These include: Spinach (cooked), rhubarb, beets, sweet potatoes, and Swiss chard. Peanuts. Potato chips, french fries, and baked potatoes with skin on. Nuts and nut products. Chocolate. If you regularly take a diuretic medicine, make sure to eat at least 1 or 2 servings of fruits or vegetables that are high in potassium each day. These include: Avocado. Banana. Orange, prune,   carrot, or tomato juice. Baked potato. Cabbage. Beans and split peas. Lifestyle  Drink enough fluid to keep your urine pale yellow. This is the most important thing you can do. Spread your fluid intake throughout the day. If you drink alcohol: Limit how much you use to: 0-1 drink a day for women who are not pregnant. 0-2 drinks a day for men. Be aware of how much alcohol is in your  drink. In the [place] one drink equals one 12 oz bottle of beer (355 mL), one 5 oz glass of wine (148 mL), or one 1 oz glass of hard liquor (44 mL). Lose weight if told by your health care provider. Work with your dietitian to find an eating plan and weight loss strategies that work best for you. General information Talk to your health care provider and dietitian about taking daily supplements. You may be told the following depending on your health and the cause of your kidney stones: Not to take supplements with vitamin C. To take a calcium supplement. To take a daily probiotic supplement. To take other supplements such as magnesium, fish oil, or vitamin B6. Take over-the-counter and prescription medicines only as told by your health care provider. These include supplements. What foods should I limit? Limit your intake of the following foods, or eat them as told by your dietitian. Vegetables Spinach. Rhubarb. Beets. Canned vegetables. Pickles. Olives. Baked potatoes with skin. Grains Wheat bran. Baked goods. Salted crackers. Cereals high in sugar. Meats and other proteins Nuts. Nut butters. Large portions of meat, poultry, or fish. Salted, precooked, or cured meats, such as sausages, meat loaves, and hot dogs. Dairy Cheese. Beverages Regular soft drinks. Regular vegetable juice. Seasonings and condiments Seasoning blends with salt. Salad dressings. Soy sauce. Ketchup. Barbecue sauce. Other foods Canned soups. Canned pasta sauce. Casseroles. Pizza. Lasagna. Frozen meals. Potato chips. French fries. The items listed above may not be a complete list of foods and beverages you should limit. Contact a dietitian for more information. What foods should I avoid? Talk to your dietitian about specific foods you should avoid based on the type of kidney stones you have and your overall health. Fruits Grapefruit. The item listed above may not be a complete list of foods and beverages you should  avoid. Contact a dietitian for more information. Summary Kidney stones are deposits of minerals and salts that form inside your kidneys. You can lower your risk of kidney stones by making changes to your diet. The most important thing you can do is drink enough fluid. Drink enough fluid to keep your urine pale yellow. Talk to your dietitian about how much calcium you should have each day, and eat less salt and animal protein as told by your dietitian. This information is not intended to replace advice given to you by your health care provider. Make sure you discuss any questions you have with your health care provider. Document Revised: 05/20/2019 Document Reviewed: 05/20/2019 Elsevier Patient Education  2022 [employer]  

## 2021-08-06 NOTE — Progress Notes (Signed)
08/06/2021 9:47 AM   Shelby Guerra 09/25/1955 [id number]  Referring provider: Denny Levy, PA [address] [place],  [place] [address]  Left flank pain   HPI: Shelby Guerra is 65yo here for evaluation of left flank pain. She has a hx of nephrolithiasis with her last event 9 years ago. 1 week ago she developed left flank pain that is sharp, moderate to severe and radiates to her groin. She was seen by her PCP and diagnosed with a UTI and started on bactrim. She has associated nausea and vomiting. Currently the left flank pain is mild. She has new urinary urgency, frequency, nocturia and a feeling of incomplete emptying for the past 2 days.    PMH: Past Medical History:  Diagnosis Date   GERD (gastroesophageal reflux disease)    High cholesterol    Hot flashes 03/10/2013   Hypertension    pt denies   Impaired fasting glucose    Kidney stone    oxalate/uric acid   Moody 03/14/2014    Surgical History: Past Surgical History:  Procedure Laterality Date   COLONOSCOPY  12/13/2008   COLONOSCOPY N/A 03/05/2019   Procedure: COLONOSCOPY;  Surgeon: Daneil Dolin, MD;  [facility];  Service: Endoscopy;  Laterality: N/A;  10:30   INGUINAL HERNIA REPAIR Left 02/26/2006   recurrent   INGUINAL HERNIA REPAIR Left    prior to 2007   KNEE ARTHROSCOPY WITH MEDIAL MENISECTOMY Left 12/04/2017   Procedure: KNEE ARTHROSCOPY WITH MEDIAL MENISCECTOMY AND CHONDROPLASTY;  Surgeon: Hiram Gash, MD;  Location: [facility];  Service: Orthopedics;  Laterality: Left;  NO BLOCK   POLYPECTOMY  03/05/2019   Procedure: POLYPECTOMY;  Surgeon: Daneil Dolin, MD;  [facility];  Service: Endoscopy;;   TOTAL ABDOMINAL HYSTERECTOMY W/ BILATERAL SALPINGOOPHORECTOMY  10/15/2006   TUBAL LIGATION      Home Medications:  Allergies as of 08/06/2021       Reactions   Codeine Itching   Can take vicodin   Propoxyphene N-acetaminophen Nausea Only   Darvocet        Medication  List        Accurate as of August 06, 2021  9:47 AM. If you have any questions, ask your nurse or doctor.          Centrum Silver 50+Women Tabs Take 1 ddaily   cetirizine 10 MG tablet Commonly known as: ZYRTEC   diclofenac 75 MG EC tablet Commonly known as: VOLTAREN   Fluocinolone Acetonide Scalp 0.01 % Oil   pantoprazole 20 MG tablet Commonly known as: Protonix Take 1 tablet (20 mg total) by mouth daily.   sertraline 50 MG tablet Commonly known as: ZOLOFT TAKE ONE (1) TABLET BY MOUTH EVERY DAY   sulfamethoxazole-trimethoprim 800-160 MG tablet Commonly known as: BACTRIM DS Take 1 tablet by mouth 2 (two) times daily.        Allergies:  Allergies  Allergen Reactions   Codeine Itching    Can take vicodin   Propoxyphene N-Acetaminophen Nausea Only    Darvocet    Family History: Family History  Problem Relation Age of Onset   Hypertension Brother    Kidney failure Brother        had kidney transplant   Hyperlipidemia Brother    Hypertension Sister    Hyperlipidemia Sister    Cancer Sister        cervical   Hypertension Brother    Kidney failure Father  Cancer Mother    Colon cancer Neg Hx     Social History:  reports that she has never smoked. She has never used smokeless tobacco. She reports that she does not drink alcohol and does not use drugs.  ROS: All other review of systems were reviewed and are negative except what is noted above in HPI  Physical Exam: BP (!) 152/80    Pulse (!) 59    Wt 187 lb (84.8 kg)    BMI 34.20 kg/m   Constitutional:  Alert and oriented, No acute distress. HEENT: [place] AT, moist mucus membranes.  Trachea midline, no masses. Cardiovascular: No clubbing, cyanosis, or edema. Respiratory: Normal respiratory effort, no increased work of breathing. GI: Abdomen is soft, nontender, nondistended, no abdominal masses GU: No CVA tenderness.  Lymph: No cervical or inguinal lymphadenopathy. Skin: No rashes, bruises or  suspicious lesions. Neurologic: Grossly intact, no focal deficits, moving all 4 extremities. Psychiatric: Normal mood and affect.  Laboratory Data: Lab Results  Component Value Date   WBC 5.6 06/12/2021   HGB 13.4 06/12/2021   HCT 38.7 06/12/2021   MCV 83 06/12/2021   PLT 213 06/12/2021    Lab Results  Component Value Date   CREATININE 0.78 06/12/2021    No results found for: PSA  No results found for: TESTOSTERONE  No results found for: HGBA1C  Urinalysis    Component Value Date/Time   COLORURINE AMBER (A) 05/09/2018 0949   APPEARANCEUR Clear 05/31/2019 1419   LABSPEC 1.014 05/09/2018 0949   PHURINE 5.0 05/09/2018 0949   GLUCOSEU Negative 05/31/2019 1419   GLUCOSEU Negative 05/31/2019 0958   HGBUR SMALL (A) 05/09/2018 0949   BILIRUBINUR negative 01/29/2020 1414   BILIRUBINUR Negative 05/31/2019 1419   KETONESUR negative 01/29/2020 1414   KETONESUR NEGATIVE 05/09/2018 0949   PROTEINUR negative 01/29/2020 1414   PROTEINUR Negative 05/31/2019 1419   PROTEINUR NEGATIVE 05/09/2018 0949   UROBILINOGEN 0.2 01/29/2020 1414   UROBILINOGEN 0.2 01/29/2015 2352   NITRITE Negative 01/29/2020 1414   NITRITE Negative 05/31/2019 1419   NITRITE neg 05/31/2019 0958   NITRITE POSITIVE (A) 05/09/2018 0949   LEUKOCYTESUR Trace (A) 01/29/2020 1414   LEUKOCYTESUR 2+ (A) 05/31/2019 1419    Lab Results  Component Value Date   LABMICR See below: 05/31/2019   WBCUA >30 (A) 05/31/2019   RBCUA 0-2 12/17/2016   LABEPIT 0-10 05/31/2019   MUCUS Present 05/31/2019   BACTERIA Few 05/31/2019    Pertinent Imaging:  No results found for this or any previous visit.  No results found for this or any previous visit.  No results found for this or any previous visit.  No results found for this or any previous visit.  No results found for this or any previous visit.  No results found for this or any previous visit.  No results found for this or any previous visit.  No results  found for this or any previous visit.   Assessment & Plan:    1. History of kidney stones -STAT CT stone study - Urinalysis, Routine w reflex microscopic  2. Flank pain -likely related to nephrolithiasis. We will obtain CT stone study   No follow-ups on file.  [name], MD  [facility] [place]

## 2021-08-10 ENCOUNTER — Ambulatory Visit (INDEPENDENT_AMBULATORY_CARE_PROVIDER_SITE_OTHER): Payer: BC Managed Care – PPO | Admitting: Urology

## 2021-08-10 ENCOUNTER — Other Ambulatory Visit: Payer: Self-pay

## 2021-08-10 ENCOUNTER — Encounter: Payer: Self-pay | Admitting: Urology

## 2021-08-10 VITALS — BP 147/82 | HR 71 | Wt 187.0 lb

## 2021-08-10 DIAGNOSIS — Z87442 Personal history of urinary calculi: Secondary | ICD-10-CM | POA: Diagnosis not present

## 2021-08-10 DIAGNOSIS — R1032 Left lower quadrant pain: Secondary | ICD-10-CM | POA: Diagnosis not present

## 2021-08-10 DIAGNOSIS — R109 Unspecified abdominal pain: Secondary | ICD-10-CM

## 2021-08-10 LAB — URINALYSIS, ROUTINE W REFLEX MICROSCOPIC
Bilirubin, UA: NEGATIVE
Glucose, UA: NEGATIVE
Ketones, UA: NEGATIVE
Nitrite, UA: NEGATIVE
Protein,UA: NEGATIVE
RBC, UA: NEGATIVE
Specific Gravity, UA: >1.03 — ABNORMAL HIGH (ref 1.005–1.030)
Urobilinogen, Ur: 0.2 mg/dL (ref 0.2–1.0)
pH, UA: 5.5 (ref 5.0–7.5)

## 2021-08-10 LAB — MICROSCOPIC EXAMINATION
RBC, Urine: NONE SEEN /hpf (ref 0–2)
Renal Epithel, UA: NONE SEEN /hpf

## 2021-08-10 MED ORDER — OXYCODONE-ACETAMINOPHEN 5-325 MG PO TABS: 1.0000 | ORAL_TABLET | ORAL | 0 refills | Status: DC | PRN

## 2021-08-10 NOTE — Progress Notes (Signed)
[facility] Surgery Posting Form  ?  ?Surgery Date/Time: March 20th 7:30a ?  ?Surgeon: Dr.Patrick McKenzie ? ?Surgery Location: [facility] ? ?Diagnosis: bilateral renal calculi ?  ?CPT: [id number] ?  ?Surgery: cystoscopy with bilateral pyelogram retrograde, bilateral ureteroscopy with laser with bilateral stent placement ? ?Stop Anticoagulations: may continue ASA if taking ?  ?Cardiac/Medical/Pulmonary Clearance needed: N/A ?  ?Orders entered into EPIC  Date: 08/10/2021  ?  ?Case booked in [place]  Date: 08/10/2021 ?  ?Notified pt of Surgery: Date: 08/10/2021 ?  ?Placed into Prior Authorization Work [name] Date:  ?  ?  ?Assistant/laser/rep:n/a ?   ? ? ?I spoke with Shelby Guerra. We have discussed possible surgery dates and 08/27/2021 was agreed upon by all parties. Patient given information about surgery date, what to expect pre-operatively and post operatively.  ?  ?We discussed that a pre-op nurse will be calling to set up the pre-op visit that will take place prior to surgery. Informed patient that our office will communicate any additional care to be provided after surgery.  ?  ?Patients questions or concerns were discussed during our call. Advised to call our office should there be any additional information, questions or concerns that arise. Patient verbalized understanding.   ?

## 2021-08-10 NOTE — Progress Notes (Signed)
08/10/2021 9:06 AM   Shelby Guerra [birth year] [id number]  Referring provider: Denny Levy, [place],  [place] [address]  Followup nephrolithiasis   HPI: Shelby Guerra is a 66yo here for followup for nephrolithiasis.  The left flank pain has improved since last visit. Ct stone study shows small bilateral renal calculi and no ureteral calculi. She denies any worsening LUTS. No dysuria or hematuria. No other complaints today   PMH: Past Medical History:  Diagnosis Date   GERD (gastroesophageal reflux disease)    High cholesterol    Hot flashes 03/10/2013   Hypertension    pt denies   Impaired fasting glucose    Kidney stone    oxalate/uric acid   Moody 03/14/2014    Surgical History: Past Surgical History:  Procedure Laterality Date   COLONOSCOPY  12/13/2008   COLONOSCOPY N/A 03/05/2019   Procedure: COLONOSCOPY;  Surgeon: Daneil Dolin, MD;  [facility];  Service: Endoscopy;  Laterality: N/A;  10:30   INGUINAL HERNIA REPAIR Left 02/26/2006   recurrent   INGUINAL HERNIA REPAIR Left    prior to 2007   KNEE ARTHROSCOPY WITH MEDIAL MENISECTOMY Left 12/04/2017   Procedure: KNEE ARTHROSCOPY WITH MEDIAL MENISCECTOMY AND CHONDROPLASTY;  Surgeon: Hiram Gash, MD;  Location: [facility];  Service: Orthopedics;  Laterality: Left;  NO BLOCK   POLYPECTOMY  03/05/2019   Procedure: POLYPECTOMY;  Surgeon: Daneil Dolin, MD;  [facility];  Service: Endoscopy;;   TOTAL ABDOMINAL HYSTERECTOMY W/ BILATERAL SALPINGOOPHORECTOMY  10/15/2006   TUBAL LIGATION      Home Medications:  Allergies as of 08/10/2021       Reactions   Codeine Itching   Can take vicodin   Propoxyphene N-acetaminophen Nausea Only   Darvocet        Medication List        Accurate as of August 10, 2021  9:06 AM. If you have any questions, ask your nurse or doctor.          Centrum Silver 50+Women Tabs Take 1 ddaily   cetirizine 10 MG tablet Commonly  known as: ZYRTEC   diclofenac 75 MG EC tablet Commonly known as: VOLTAREN   Fluocinolone Acetonide Scalp 0.01 % Oil   pantoprazole 20 MG tablet Commonly known as: Protonix Take 1 tablet (20 mg total) by mouth daily.   sertraline 50 MG tablet Commonly known as: ZOLOFT TAKE ONE (1) TABLET BY MOUTH EVERY DAY   sulfamethoxazole-trimethoprim 800-160 MG tablet Commonly known as: BACTRIM DS Take 1 tablet by mouth 2 (two) times daily.        Allergies:  Allergies  Allergen Reactions   Codeine Itching    Can take vicodin   Propoxyphene N-Acetaminophen Nausea Only    Darvocet    Family History: Family History  Problem Relation Age of Onset   Hypertension Brother    Kidney failure Brother        had kidney transplant   Hyperlipidemia Brother    Hypertension Sister    Hyperlipidemia Sister    Cancer Sister        cervical   Hypertension Brother    Kidney failure Father    Cancer Mother    Colon cancer Neg Hx     Social History:  reports that she has never smoked. She has never used smokeless tobacco. She reports that she does not drink alcohol and does not use drugs.  ROS: All other review  of systems were reviewed and are negative except what is noted above in HPI  Physical Exam: BP (!) 147/82    Pulse 71    Wt 187 lb (84.8 kg)    BMI 34.20 kg/m   Constitutional:  Alert and oriented, No acute distress. HEENT: [place] AT, moist mucus membranes.  Trachea midline, no masses. Cardiovascular: No clubbing, cyanosis, or edema. Respiratory: Normal respiratory effort, no increased work of breathing. GI: Abdomen is soft, nontender, nondistended, no abdominal masses GU: No CVA tenderness.  Lymph: No cervical or inguinal lymphadenopathy. Skin: No rashes, bruises or suspicious lesions. Neurologic: Grossly intact, no focal deficits, moving all 4 extremities. Psychiatric: Normal mood and affect.  Laboratory Data: Lab Results  Component Value Date   WBC 5.6 06/12/2021   HGB  13.4 06/12/2021   HCT 38.7 06/12/2021   MCV 83 06/12/2021   PLT 213 06/12/2021    Lab Results  Component Value Date   CREATININE 0.78 06/12/2021    No results found for: PSA  No results found for: TESTOSTERONE  No results found for: HGBA1C  Urinalysis    Component Value Date/Time   COLORURINE AMBER (A) 05/09/2018 0949   APPEARANCEUR Clear 08/06/2021 1034   LABSPEC 1.014 05/09/2018 0949   PHURINE 5.0 05/09/2018 0949   GLUCOSEU Negative 08/06/2021 1034   HGBUR SMALL (A) 05/09/2018 0949   BILIRUBINUR Negative 08/06/2021 1034   KETONESUR negative 01/29/2020 1414   KETONESUR NEGATIVE 05/09/2018 0949   PROTEINUR Negative 08/06/2021 1034   PROTEINUR NEGATIVE 05/09/2018 0949   UROBILINOGEN 0.2 01/29/2020 1414   UROBILINOGEN 0.2 01/29/2015 2352   NITRITE Negative 08/06/2021 1034   NITRITE POSITIVE (A) 05/09/2018 0949   LEUKOCYTESUR 1+ (A) 08/06/2021 1034    Lab Results  Component Value Date   LABMICR See below: 08/06/2021   WBCUA 6-10 (A) 08/06/2021   RBCUA 0-2 12/17/2016   LABEPIT 0-10 08/06/2021   MUCUS Present 05/31/2019   BACTERIA Few 08/06/2021    Pertinent Imaging: CT stone study 08/06/2021: Images reviewed and discussed with the patient  No results found for this or any previous visit.  No results found for this or any previous visit.  No results found for this or any previous visit.  No results found for this or any previous visit.  No results found for this or any previous visit.  No results found for this or any previous visit.  No results found for this or any previous visit.  Results for orders placed in visit on 08/06/21  CT RENAL STONE STUDY  Narrative CLINICAL DATA:  Acute left flank pain.  EXAM: CT ABDOMEN AND PELVIS WITHOUT CONTRAST  TECHNIQUE: Multidetector CT imaging of the abdomen and pelvis was performed following the standard protocol without IV contrast.  RADIATION DOSE REDUCTION: This exam was performed according to  the departmental dose-optimization program which includes automated exposure control, adjustment of the mA and/or kV according to patient size and/or use of iterative reconstruction technique.  COMPARISON:  February 09, 2010.  FINDINGS: Lower chest: No acute abnormality.  Hepatobiliary: No focal liver abnormality is seen. No gallstones, gallbladder wall thickening, or biliary dilatation.  Pancreas: Unremarkable. No pancreatic ductal dilatation or surrounding inflammatory changes.  Spleen: Normal in size without focal abnormality.  Adrenals/Urinary Tract: Adrenal glands appear normal. Bilateral nephrolithiasis is noted. No hydronephrosis or renal obstruction is noted. Urinary bladder is unremarkable.  Stomach/Bowel: Stomach is within normal limits. Appendix appears normal. No evidence of bowel wall thickening, distention, or inflammatory changes.  Vascular/Lymphatic: Aortic  atherosclerosis. No enlarged abdominal or pelvic lymph nodes.  Reproductive: Status post hysterectomy. No adnexal masses.  Other: Small fat containing right inguinal hernia is noted. No ascites is noted.  Musculoskeletal: No acute or significant osseous findings.  IMPRESSION: Bilateral nonobstructive nephrolithiasis. No hydronephrosis or renal obstruction is noted.  Small fat containing right inguinal hernia.  Aortic Atherosclerosis (ICD10-I70.0).   Electronically Signed By: Marijo Conception M.D. On: 08/06/2021 12:21   Assessment & Plan:    1. kidney stones -We discussed the management of kidney stones. These options include observation, ureteroscopy, shockwave lithotripsy (ESWL) and percutaneous nephrolithotomy (PCNL). We discussed which options are relevant to the patient's stone(s). We discussed the natural history of kidney stones as well as the complications of untreated stones and the impact on quality of life without treatment as well as with each of the above listed treatments. We also  discussed the efficacy of each treatment in its ability to clear the stone burden. With any of these management options I discussed the signs and symptoms of infection and the need for emergent treatment should these be experienced. For each option we discussed the ability of each procedure to clear the patient of their stone burden.   For observation I described the risks which include but are not limited to silent renal damage, life-threatening infection, need for emergent surgery, failure to pass stone and pain.   For ureteroscopy I described the risks which include bleeding, infection, damage to contiguous structures, positioning injury, ureteral stricture, ureteral avulsion, ureteral injury, need for prolonged ureteral stent, inability to perform ureteroscopy, need for an interval procedure, inability to clear stone burden, stent discomfort/pain, heart attack, stroke, pulmonary embolus and the inherent risks with general anesthesia.   For shockwave lithotripsy I described the risks which include arrhythmia, kidney contusion, kidney hemorrhage, need for transfusion, pain, inability to adequately break up stone, inability to pass stone fragments, Steinstrasse, infection associated with obstructing stones, need for alternate surgical procedure, need for repeat shockwave lithotripsy, MI, CVA, PE and the inherent risks with anesthesia/conscious sedation.   For PCNL I described the risks including positioning injury, pneumothorax, hydrothorax, need for chest tube, inability to clear stone burden, renal laceration, arterial venous fistula or malformation, need for embolization of kidney, loss of kidney or renal function, need for repeat procedure, need for prolonged nephrostomy tube, ureteral avulsion, MI, CVA, PE and the inherent risks of general anesthesia.   - The patient would like to proceed with bilateral ureteroscopic stone extraction - Urinalysis, Routine w reflex microscopic     No follow-ups  on file.  [name], MD  [facility] [place]

## 2021-08-13 ENCOUNTER — Encounter: Payer: Self-pay | Admitting: Urology

## 2021-08-13 NOTE — Patient Instructions (Signed)
Ureteroscopy °Ureteroscopy is a procedure to check for and treat problems inside part of the urinary tract. In this procedure, a thin, flexible tube with a light at the end (ureteroscope) is used to look at the inside of the kidneys and the ureters. The ureters are the tubes that carry urine from the kidneys to the bladder. The ureteroscope is inserted into one or both of the ureters. °You may need this procedure if you have frequent urinary tract infections (UTIs), blood in your urine, or a stone in one of your ureters. A ureteroscopy can be done: °To find the cause of urine blockage in a ureter and to evaluate other abnormalities inside the ureters or kidneys. °To remove stones. °To remove or treat growths of tissue (polyps), abnormal tissue, and some types of tumors. °To remove a tissue sample and check it for disease under a microscope (biopsy). °Tell a health care provider about: °Any allergies you have. °All medicines you are taking, including vitamins, herbs, eye drops, creams, and over-the-counter medicines. °Any problems you or family members have had with anesthetic medicines. °Any blood disorders you have. °Any surgeries you have had. °Any medical conditions you have. °Whether you are pregnant or may be pregnant. °What are the risks? °Generally, this is a safe procedure. However, problems may occur, including: °Bleeding. °Infection. °Allergic reactions to medicines. °Scarring that narrows the ureter (stricture). °Creating a hole in the ureter (perforation). °What happens before the procedure? °Staying hydrated °Follow instructions from your health care provider about hydration, which may include: °Up to 2 hours before the procedure - you may continue to drink clear liquids, such as water, clear fruit juice, black coffee, and plain tea. ° °Eating and drinking restrictions °Follow instructions from your health care provider about eating and drinking, which may include: °8 hours before the procedure - stop  eating heavy meals or foods, such as meat, fried foods, or fatty foods. °6 hours before the procedure - stop eating light meals or foods, such as toast or cereal. °6 hours before the procedure - stop drinking milk or drinks that contain milk. °2 hours before the procedure - stop drinking clear liquids. °Medicines °Ask your health care provider about: °Changing or stopping your regular medicines. This is especially important if you are taking diabetes medicines or blood thinners. °Taking medicines such as aspirin and ibuprofen. These medicines can thin your blood. Do not take these medicines unless your health care provider tells you to take them. °Taking over-the-counter medicines, vitamins, herbs, and supplements. °General instructions °Do not use any products that contain nicotine or tobacco for at least 4 weeks before the procedure. These products include cigarettes, e-cigarettes, and chewing tobacco. If you need help quitting, ask your health care provider. °You may have a urine sample taken to check for infection. °Plan to have someone take you home from the hospital or clinic. °If you will be going home right after the procedure, plan to have someone with you for 24 hours. °Ask your health care provider what steps will be taken to help prevent infection. These may include: °Washing skin with a germ-killing soap. °Receiving antibiotic medicine. °What happens during the procedure? ° °An IV will be inserted into one of your veins. °You will be given one or more of the following: °A medicine to help you relax (sedative). °A medicine to make you fall asleep (general anesthetic). °A medicine that is injected into your spine to numb the area below and slightly above the injection site (spinal anesthetic). °The   part of your body that drains urine from your bladder (urethra) will be cleaned with a germ-killing solution. °The ureteroscope will be passed through your urethra into your bladder. °A salt-water solution will  be sent through the ureteroscope to fill your bladder. This will help the health care provider see the openings of your ureters more clearly. °The ureteroscope will be passed into your ureter. °If a growth is found, a biopsy may be done. °If a stone is found, it may be removed through the ureteroscope, or the stone may be broken up using a laser, shock waves, or electrical energy. °In some cases, if the ureter is too small, a tube may be inserted that keeps the ureter open (ureteral stent). The stent may be left in place for 1 or 2 weeks to keep the ureter open, and then the ureteroscopy procedure will be done. °The scope will be removed, and your bladder will be emptied. °The procedure may vary among health care providers and hospitals. °What can I expect after the procedure? °After your procedure, it is common to have: °Your blood pressure, heart rate, breathing rate, and blood oxygen level monitored until you leave the hospital or clinic. °A burning sensation when you urinate. You may be asked to urinate. °Blood in your urine. °Mild discomfort in your bladder area or kidney area when urinating. °A need to urinate more often or urgently. °Follow these instructions at home: °Medicines °Take over-the-counter and prescription medicines only as told by your health care provider. °If you were prescribed an antibiotic medicine, take it as told by your health care provider. Do not stop taking the antibiotic even if you start to feel better. °General instructions ° °If you were given a sedative during the procedure, it can affect you for several hours. Do not drive or operate machinery until your health care provider says that it is safe. °To relieve burning, take a warm bath or hold a warm washcloth over your groin. °Drink enough fluid to keep your urine pale yellow. °Drink two 8-ounce (237 mL) glasses of water every hour for the first 2 hours after you get home. °Continue to drink water often at home. °You can eat what  you normally do. °Keep all follow-up visits as told by your health care provider. This is important. °If you had a ureteral stent placed, ask your health care provider when you need to return to have it removed. °Contact a health care provider if you have: °Chills or a fever. °Burning pain for longer than 24 hours after the procedure. °Blood in your urine for longer than 24 hours after the procedure. °Get help right away if you have: °Large amounts of blood in your urine. °Blood clots in your urine. °Severe pain. °Chest pain or trouble breathing. °The feeling of a full bladder and you are unable to urinate. °These symptoms may represent a serious problem that is an emergency. Do not wait to see if the symptoms will go away. Get medical help right away. Call your local emergency services (911 in the [place] °Summary °Ureteroscopy is a procedure to check for and treat problems inside part of the urinary tract. °In this procedure, a thin, flexible tube with a light at the end (ureteroscope) is used to look at the inside of the kidneys and the ureters. °You may need this procedure if you have frequent urinary tract infections (UTIs), blood in your urine, or a stone in a ureter. °This information is not intended to replace advice given to   you by your health care provider. Make sure you discuss any questions you have with your health care provider. °Document Revised: 02/07/2021 Document Reviewed: 03/03/2019 °Elsevier Patient Education © 2022 [employer] ° °

## 2021-08-21 NOTE — Patient Instructions (Signed)
? ? ? ? ? ? ? Shelby Guerra ? 08/21/2021  ?  ? '@PREFPERIOPPHARMACY'$ @ ? ? Your procedure is scheduled on  08/27/2021. ? ? Report to [facility] at  0600 A.M. ? ? Call this number if you have problems the morning of surgery: ? [phone] ? ? Remember: ? Do not eat or drink after midnight. ?  ?  ? Take these medicines the morning of surgery with A SIP OF WATER  ? ?zyrtec, oxycodone(if needed), protonix, zoloft. ? ?  ? Do not wear jewelry, make-up or nail polish. ? Do not wear lotions, powders, or perfumes, or deodorant. ? Do not shave 48 hours prior to surgery.  Men may shave face and neck. ? Do not bring valuables to the hospital. ? [facility] is not responsible for any belongings or valuables. ? ?Contacts, dentures or bridgework may not be worn into surgery.  Leave your suitcase in the car.  After surgery it may be brought to your room. ? ?For patients admitted to the hospital, discharge time will be determined by your treatment team. ? ?Patients discharged the day of surgery will not be allowed to drive home and must have someone with them for 24 hours.  ? ? ?Special instructions:   DO NOT smoke tobacco or vape for 24 hours before your procedure. ? ?Please read over the following fact sheets that you were given. ?Coughing and Deep Breathing, Surgical Site Infection Prevention, Anesthesia Post-op Instructions, and Care and Recovery After Surgery ?  ? ? ? Ureteral Stent Implantation, Care After ?This sheet gives you information about how to care for yourself after your procedure. Your health care provider may also give you more specific instructions. If you have problems or questions, contact your health care provider. ?What can I expect after the procedure? ?After the procedure, it is common to have: ?Nausea. ?Mild pain when you urinate. You may feel this pain in your lower back or lower abdomen. The pain should stop within a few minutes after you urinate. This may last for up to 1 week. ?A small amount of blood  in your urine for several days. ?Follow these instructions at home: ?Medicines ?Take over-the-counter and prescription medicines only as told by your health care provider. ?If you were prescribed an antibiotic medicine, take it as told by your health care provider. Do not stop taking the antibiotic even if you start to feel better. ?Do not drive for 24 hours if you were given a sedative during your procedure. ?Ask your health care provider if the medicine prescribed to you requires you to avoid driving or using heavy machinery. ?Activity ?Rest as told by your health care provider. ?Avoid sitting for a long time without moving. Get up to take short walks every 1-2 hours. This is important to improve blood flow and breathing. Ask for help if you feel weak or unsteady. ?Return to your normal activities as told by your health care provider. Ask your health care provider what activities are safe for you. ?General instructions ? ?Watch for any blood in your urine. Call your health care provider if the amount of blood in your urine increases. ?If you have a catheter: ?Follow instructions from your health care provider about taking care of your catheter and collection bag. ?Do not take baths, swim, or use a hot tub until your health care provider approves. Ask your health care provider if you may take showers. You may only be allowed to take sponge baths. ?Drink enough  fluid to keep your urine pale yellow. ?Do not use any products that contain nicotine or tobacco, such as cigarettes, e-cigarettes, and chewing tobacco. These can delay healing after surgery. If you need help quitting, ask your health care provider. ?Keep all follow-up visits as told by your health care provider. This is important. ?Contact a health care provider if: ?You have pain that gets worse or does not get better with medicine, especially pain when you urinate. ?You have difficulty urinating. ?You feel nauseous or you vomit repeatedly during a period of  more than 2 days after the procedure. ?Get help right away if: ?Your urine is dark red or has blood clots in it. ?You are leaking urine (have incontinence). ?The end of the stent comes out of your urethra. ?You cannot urinate. ?You have sudden, sharp, or severe pain in your abdomen or lower back. ?You have a fever. ?You have swelling or pain in your legs. ?You have difficulty breathing. ?Summary ?After the procedure, it is common to have mild pain when you urinate that goes away within a few minutes after you urinate. This may last for up to 1 week. ?Watch for any blood in your urine. Call your health care provider if the amount of blood in your urine increases. ?Take over-the-counter and prescription medicines only as told by your health care provider. ?Drink enough fluid to keep your urine pale yellow. ?This information is not intended to replace advice given to you by your health care provider. Make sure you discuss any questions you have with your health care provider. ?Document Revised: 03/03/2018 Document Reviewed: 03/04/2018 ?Elsevier Patient Education ? [place]. ?General Anesthesia, Adult, Care After ?This sheet gives you information about how to care for yourself after your procedure. Your health care provider may also give you more specific instructions. If you have problems or questions, contact your health care provider. ?What can I expect after the procedure? ?After the procedure, the following side effects are common: ?Pain or discomfort at the IV site. ?Nausea. ?Vomiting. ?Sore throat. ?Trouble concentrating. ?Feeling cold or chills. ?Feeling weak or tired. ?Sleepiness and fatigue. ?Soreness and body aches. These side effects can affect parts of the body that were not involved in surgery. ?Follow these instructions at home: ?For the time period you were told by your health care provider: ? ?Rest. ?Do not participate in activities where you could fall or become injured. ?Do not drive or use  machinery. ?Do not drink alcohol. ?Do not take sleeping pills or medicines that cause drowsiness. ?Do not make important decisions or sign legal documents. ?Do not take care of children on your own. ?Eating and drinking ?Follow any instructions from your health care provider about eating or drinking restrictions. ?When you feel hungry, start by eating small amounts of foods that are soft and easy to digest (bland), such as toast. Gradually return to your regular diet. ?Drink enough fluid to keep your urine pale yellow. ?If you vomit, rehydrate by drinking water, juice, or clear broth. ?General instructions ?If you have sleep apnea, surgery and certain medicines can increase your risk for breathing problems. Follow instructions from your health care provider about wearing your sleep device: ?Anytime you are sleeping, including during daytime naps. ?While taking prescription pain medicines, sleeping medicines, or medicines that make you drowsy. ?Have a responsible adult stay with you for the time you are told. It is important to have someone help care for you until you are awake and alert. ?Return to your normal  activities as told by your health care provider. Ask your health care provider what activities are safe for you. ?Take over-the-counter and prescription medicines only as told by your health care provider. ?If you smoke, do not smoke without supervision. ?Keep all follow-up visits as told by your health care provider. This is important. ?Contact a health care provider if: ?You have nausea or vomiting that does not get better with medicine. ?You cannot eat or drink without vomiting. ?You have pain that does not get better with medicine. ?You are unable to pass urine. ?You develop a skin rash. ?You have a fever. ?You have redness around your IV site that gets worse. ?Get help right away if: ?You have difficulty breathing. ?You have chest pain. ?You have blood in your urine or stool, or you vomit  blood. ?Summary ?After the procedure, it is common to have a sore throat or nausea. It is also common to feel tired. ?Have a responsible adult stay with you for the time you are told. It is important to have someone h

## 2021-08-23 ENCOUNTER — Encounter (HOSPITAL_COMMUNITY): Payer: Self-pay

## 2021-08-23 ENCOUNTER — Encounter (HOSPITAL_COMMUNITY)
Admission: RE | Admit: 2021-08-23 | Discharge: 2021-08-23 | Disposition: A | Discharge status: Home / Self Care | Payer: BC Managed Care – PPO | Source: Ambulatory Visit | Attending: Urology | Admitting: Urology

## 2021-08-27 ENCOUNTER — Ambulatory Visit (HOSPITAL_COMMUNITY)
Admission: RE | Admit: 2021-08-27 | Discharge: 2021-08-27 | Disposition: A | Discharge status: Home / Self Care | Payer: BC Managed Care – PPO | Attending: Urology | Admitting: Urology

## 2021-08-27 ENCOUNTER — Ambulatory Visit (HOSPITAL_COMMUNITY): Payer: BC Managed Care – PPO | Admitting: Anesthesiology

## 2021-08-27 ENCOUNTER — Encounter (HOSPITAL_COMMUNITY): Payer: Self-pay | Admitting: Urology

## 2021-08-27 ENCOUNTER — Ambulatory Visit (HOSPITAL_COMMUNITY): Payer: BC Managed Care – PPO

## 2021-08-27 ENCOUNTER — Encounter (HOSPITAL_COMMUNITY)
Admission: RE | Disposition: A | Discharge status: Home / Self Care | Payer: Self-pay | Source: Home / Self Care | Attending: Urology

## 2021-08-27 DIAGNOSIS — K219 Gastro-esophageal reflux disease without esophagitis: Secondary | ICD-10-CM | POA: Insufficient documentation

## 2021-08-27 DIAGNOSIS — F32A Depression, unspecified: Secondary | ICD-10-CM | POA: Diagnosis not present

## 2021-08-27 DIAGNOSIS — N200 Calculus of kidney: Secondary | ICD-10-CM | POA: Insufficient documentation

## 2021-08-27 DIAGNOSIS — Z87442 Personal history of urinary calculi: Secondary | ICD-10-CM | POA: Insufficient documentation

## 2021-08-27 HISTORY — PX: CYSTOSCOPY WITH RETROGRADE PYELOGRAM, URETEROSCOPY AND STENT PLACEMENT: SHX5789

## 2021-08-27 HISTORY — PX: CYSTOSCOPY WITH STENT PLACEMENT: SHX5790

## 2021-08-27 HISTORY — PX: STONE EXTRACTION WITH BASKET: SHX5318

## 2021-08-27 SURGERY — CYSTOURETEROSCOPY, WITH RETROGRADE PYELOGRAM AND STENT INSERTION
Anesthesia: General | Site: Ureter | Laterality: Right

## 2021-08-27 MED ORDER — MIDAZOLAM HCL 5 MG/5ML IJ SOLN
INTRAMUSCULAR | Status: DC | PRN
  Administered 2021-08-27: 2 mg via INTRAVENOUS

## 2021-08-27 MED ORDER — MEPERIDINE HCL 50 MG/ML IJ SOLN: 6.2500 mg | INTRAMUSCULAR | Status: DC | PRN

## 2021-08-27 MED ORDER — DEXAMETHASONE SODIUM PHOSPHATE 10 MG/ML IJ SOLN
INTRAMUSCULAR | Status: DC | PRN
  Administered 2021-08-27: 10 mg via INTRAVENOUS

## 2021-08-27 MED ORDER — OXYCODONE-ACETAMINOPHEN 5-325 MG PO TABS: 1.0000 | ORAL_TABLET | ORAL | 0 refills | Status: DC | PRN

## 2021-08-27 MED ORDER — KETOROLAC TROMETHAMINE 30 MG/ML IJ SOLN
INTRAMUSCULAR | Status: DC | PRN
  Administered 2021-08-27: 30 mg via INTRAVENOUS

## 2021-08-27 MED ORDER — DEXAMETHASONE SODIUM PHOSPHATE 10 MG/ML IJ SOLN
INTRAMUSCULAR | Status: AC
  Filled 2021-08-27: qty 1

## 2021-08-27 MED ORDER — FENTANYL CITRATE (PF) 100 MCG/2ML IJ SOLN
INTRAMUSCULAR | Status: DC | PRN
  Administered 2021-08-27: 100 ug via INTRAVENOUS
  Administered 2021-08-27 (×2): 50 ug via INTRAVENOUS

## 2021-08-27 MED ORDER — ONDANSETRON HCL 4 MG PO TABS: 4.0000 mg | ORAL_TABLET | Freq: Every day | ORAL | 1 refills | Status: DC | PRN

## 2021-08-27 MED ORDER — DIATRIZOATE MEGLUMINE 30 % UR SOLN
URETHRAL | Status: AC
  Filled 2021-08-27: qty 100

## 2021-08-27 MED ORDER — HYDROMORPHONE HCL 1 MG/ML IJ SOLN: 0.2500 mg | INTRAMUSCULAR | Status: DC | PRN

## 2021-08-27 MED ORDER — LIDOCAINE HCL (CARDIAC) PF 100 MG/5ML IV SOSY
PREFILLED_SYRINGE | INTRAVENOUS | Status: DC | PRN
  Administered 2021-08-27: 50 mg via INTRAVENOUS

## 2021-08-27 MED ORDER — ORAL CARE MOUTH RINSE: 15.0000 mL | Freq: Once | OROMUCOSAL | Status: AC

## 2021-08-27 MED ORDER — SEVOFLURANE IN SOLN
RESPIRATORY_TRACT | Status: AC
  Filled 2021-08-27: qty 250

## 2021-08-27 MED ORDER — ONDANSETRON HCL 4 MG/2ML IJ SOLN: 4.0000 mg | Freq: Once | INTRAMUSCULAR | Status: DC | PRN

## 2021-08-27 MED ORDER — ONDANSETRON HCL 4 MG/2ML IJ SOLN
INTRAMUSCULAR | Status: AC
  Filled 2021-08-27: qty 2

## 2021-08-27 MED ORDER — TAMSULOSIN HCL 0.4 MG PO CAPS: 0.4000 mg | ORAL_CAPSULE | Freq: Every day | ORAL | 0 refills | Status: DC

## 2021-08-27 MED ORDER — WATER FOR IRRIGATION, STERILE IR SOLN
Status: DC | PRN
  Administered 2021-08-27: 500 mL

## 2021-08-27 MED ORDER — CEFAZOLIN SODIUM-DEXTROSE 2-4 GM/100ML-% IV SOLN
2.0000 g | INTRAVENOUS | Status: AC
  Administered 2021-08-27: 2 g via INTRAVENOUS
  Filled 2021-08-27: qty 100

## 2021-08-27 MED ORDER — PROPOFOL 10 MG/ML IV BOLUS
INTRAVENOUS | Status: DC | PRN
  Administered 2021-08-27: 160 mg via INTRAVENOUS

## 2021-08-27 MED ORDER — PROPOFOL 10 MG/ML IV BOLUS
INTRAVENOUS | Status: AC
  Filled 2021-08-27: qty 20

## 2021-08-27 MED ORDER — SODIUM CHLORIDE 0.9 % IR SOLN
Status: DC | PRN
  Administered 2021-08-27 (×2): 3000 mL

## 2021-08-27 MED ORDER — ONDANSETRON HCL 4 MG/2ML IJ SOLN
INTRAMUSCULAR | Status: DC | PRN
  Administered 2021-08-27: 4 mg via INTRAVENOUS

## 2021-08-27 MED ORDER — DIATRIZOATE MEGLUMINE 30 % UR SOLN
URETHRAL | Status: DC | PRN
  Administered 2021-08-27: 13 mL via URETHRAL

## 2021-08-27 MED ORDER — MIDAZOLAM HCL 2 MG/2ML IJ SOLN
INTRAMUSCULAR | Status: AC
Start: 2021-08-27 — End: ?
  Filled 2021-08-27: qty 2

## 2021-08-27 MED ORDER — LACTATED RINGERS IV SOLN
INTRAVENOUS | Status: DC
  Administered 2021-08-27: 1000 mL via INTRAVENOUS

## 2021-08-27 MED ORDER — FENTANYL CITRATE (PF) 100 MCG/2ML IJ SOLN
INTRAMUSCULAR | Status: AC
  Filled 2021-08-27: qty 2

## 2021-08-27 MED ORDER — EPHEDRINE 5 MG/ML INJ
INTRAVENOUS | Status: AC
  Filled 2021-08-27: qty 5

## 2021-08-27 MED ORDER — EPHEDRINE SULFATE (PRESSORS) 50 MG/ML IJ SOLN
INTRAMUSCULAR | Status: DC | PRN
  Administered 2021-08-27 (×2): 10 mg via INTRAVENOUS

## 2021-08-27 MED ORDER — CHLORHEXIDINE GLUCONATE 0.12 % MT SOLN
15.0000 mL | Freq: Once | OROMUCOSAL | Status: AC
  Administered 2021-08-27: 15 mL via OROMUCOSAL

## 2021-08-27 MED ORDER — LIDOCAINE HCL (PF) 2 % IJ SOLN
INTRAMUSCULAR | Status: AC
  Filled 2021-08-27: qty 5

## 2021-08-27 SURGICAL SUPPLY — 26 items
BAG DRAIN URO TABLE W/ADPT NS (BAG) ×5 IMPLANT
BAG DRN 8 ADPR NS SKTRN CSTL (BAG) ×3
BAG HAMPER (MISCELLANEOUS) ×5 IMPLANT
CATH INTERMIT  6FR 70CM (CATHETERS) ×5 IMPLANT
CLOTH BEACON ORANGE TIMEOUT ST (SAFETY) ×5 IMPLANT
DECANTER SPIKE VIAL GLASS SM (MISCELLANEOUS) ×3 IMPLANT
EXTRACTOR STONE NITINOL NGAGE (UROLOGICAL SUPPLIES) ×2 IMPLANT
GLOVE SURG POLYISO LF SZ8 (GLOVE) ×5 IMPLANT
GLOVE SURG UNDER POLY LF SZ7 (GLOVE) ×10 IMPLANT
GOWN STRL REUS W/TWL LRG LVL3 (GOWN DISPOSABLE) ×5 IMPLANT
GOWN STRL REUS W/TWL XL LVL3 (GOWN DISPOSABLE) ×5 IMPLANT
GUIDEWIRE STR DUAL SENSOR (WIRE) ×5 IMPLANT
GUIDEWIRE STR ZIPWIRE 035X150 (MISCELLANEOUS) ×5 IMPLANT
IV NS IRRIG 3000ML ARTHROMATIC (IV SOLUTION) ×10 IMPLANT
KIT TURNOVER CYSTO (KITS) ×5 IMPLANT
MANIFOLD NEPTUNE II (INSTRUMENTS) ×5 IMPLANT
PACK CYSTO (CUSTOM PROCEDURE TRAY) ×5 IMPLANT
PAD ARMBOARD 7.5X6 YLW CONV (MISCELLANEOUS) ×5 IMPLANT
SHEATH URETERAL 12FRX35CM (MISCELLANEOUS) ×2 IMPLANT
STENT URET 6FRX24 CONTOUR (STENTS) ×2 IMPLANT
SYR 10ML LL (SYRINGE) ×5 IMPLANT
SYR CONTROL 10ML LL (SYRINGE) ×5 IMPLANT
TOWEL OR 17X26 4PK STRL BLUE (TOWEL DISPOSABLE) ×5 IMPLANT
TRACTIP FLEXIVA PULS ID 200XHI (Laser) IMPLANT
TRACTIP FLEXIVA PULSE ID 200 (Laser)
WATER STERILE IRR 500ML POUR (IV SOLUTION) ×5 IMPLANT

## 2021-08-27 NOTE — Anesthesia Postprocedure Evaluation (Signed)
Anesthesia Post Note ? ?Patient: Shelby Guerra ? ?Procedure(s) Performed: BILATERAL RETROGRADE PYELOGRAM, BILATERAL URETEROSCOPY (Bilateral: Ureter) ?STONE EXTRACTION WITH BASKET (Bilateral: Ureter) ?CYSTOSCOPY WITH STENT PLACEMENT (Right: Ureter) ? ?Patient location during evaluation: Phase II ?Anesthesia Type: General ?Level of consciousness: awake and alert and oriented ?Pain management: pain level controlled ?Vital Signs Assessment: post-procedure vital signs reviewed and stable ?Respiratory status: spontaneous breathing, nonlabored ventilation and respiratory function stable ?Cardiovascular status: blood pressure returned to baseline and stable ?Postop Assessment: no apparent nausea or vomiting ?Anesthetic complications: no ? ? ?No notable events documented. ? ? ?Last Vitals:  ?Vitals:  ? 08/27/21 0915 08/27/21 0924  ?BP: (!) 146/68 (!) 144/83  ?Pulse: 60 61  ?Resp: 10 15  ?Temp:    ?SpO2: 100% 100%  ?  ?Last Pain:  ?Vitals:  ? 08/27/21 0924  ?TempSrc:   ?PainSc: 0-No pain  ? ? ?  ?  ?  ?  ?  ?  ? ?Zariana Strub C Dailyn Kempner ? ? ? ? ?

## 2021-08-27 NOTE — Op Note (Addendum)
.  Preoperative diagnosis: bilateral renal calculi ? ?Postoperative diagnosis: Same ? ?Procedure: 1 cystoscopy ?2. Bilateral retrograde pyelography ?3.  Intraoperative fluoroscopy, under one hour, with interpretation ?4.  Left ureteroscopic stone manipulation with basket extraction ?5.  Right diagnostic ureteroscopy ?6. Right 6 x 24 JJ stent placement ? ? ?Attending: [name] ? ?Anesthesia: General ? ?Estimated blood loss: None ? ?Drains: right 6 x 24 JJ ureteral stent without tether ? ?Specimens: stone for analysis ? ?Antibiotics: ancef ? ?Findings: left mid and lower pole renal calculi. No hydronephrosis. Right UPJ narrowing which did not allow for advancement of the flexible ureteroscope into the renal pelvis. No masses/lesions in the bladder. Ureteral orifices in normal anatomic location. ? ?Indications: Patient is a 66 year old female/female with a history of bilateral renal calculi and bilateral flank pain. After discussing treatment options, they decided proceed with bilateral ureteroscopic stone manipulation. ? ?Procedure her in detail: The patient was brought to the operating room and a brief timeout was done to ensure correct patient, correct procedure, correct site.  General anesthesia was administered patient was placed in dorsal lithotomy position.  Her genitalia was then prepped and draped in usual sterile fashion.  A rigid 31 French cystoscope was passed in the urethra and the bladder.  Bladder was inspected free masses or lesions.  the ureteral orifices were in the normal orthotopic locations. a 6 french ureteral catheter was then instilled into the left ureteral orifice.  a gentle retrograde was obtained and findings noted above. We then advanced a zipwire through the catheter and up to the renal pelvis.  we then removed the cystoscope and cannulated the left ureteral orifice with a semirigid ureteroscope.  We located no stone in the ureter. We then placed a sensor wire up to the renal pelvis.  We removed the scope and advanced a flexible ureteroscope up to the renal pelvis to perform nephroscopy. We located calculi in the mid and lower poles which were removed with an NGage basket. We then removed the ureteroscope and did not place a stent since this was an uncomplicated ureteroscopy.    We then turned out attention to the right side. a 6 french ureteral catheter was then instilled into the right ureteral orifice.  a gentle retrograde was obtained and findings noted above We then advanced a zipwire through the catheter and up to the renal pelvis.  we then removed the cystoscope and cannulated the right ureteral orifice with a semirigid ureteroscope.  We located no stone in the ureter. We then placed a sensor wire up to the renal pelvis. We then advanced a flexible ureteroscope up to the UPJ but we were unable to advance into the renal pelvis. We elected to place a stent.   we then placed a 6 x 26 double-j ureteral stent over the original zip wire.  We then removed the wire and good coil was noted in the the renal pelvis under fluoroscopy and the bladder under direct vision.   the bladder was then drained and this concluded the procedure which was well tolerated by patient. ? ?Complications: None ? ?Condition: Stable, extubated, transferred to PACU ? ?Plan: Patient is to be discharged home as to follow-up in 2 weeks.  ?

## 2021-08-27 NOTE — Transfer of Care (Signed)
Immediate Anesthesia Transfer of Care Note ? ?Patient: Shelby Guerra ? ?Procedure(s) Performed: BILATERAL RETROGRADE PYELOGRAM, BILATERAL URETEROSCOPY (Bilateral: Ureter) ?STONE EXTRACTION WITH BASKET (Bilateral: Ureter) ?CYSTOSCOPY WITH STENT PLACEMENT (Right: Ureter) ? ?Patient Location: PACU ? ?Anesthesia Type:General ? ?Level of Consciousness: awake, alert , oriented and patient cooperative ? ?Airway & Oxygen Therapy: Patient Spontanous Breathing and Patient connected to nasal cannula oxygen ? ?Post-op Assessment: Report given to RN, Post -op Vital signs reviewed and stable and Patient moving all extremities X 4 ? ?Post vital signs: Reviewed and stable ? ?Last Vitals:  ?Vitals Value Taken Time  ?BP    ?Temp    ?Pulse 63 08/27/21 0844  ?Resp 14 08/27/21 0844  ?SpO2 97 % 08/27/21 0844  ?Vitals shown include unvalidated device data. ? ?Last Pain:  ?Vitals:  ? 08/27/21 0650  ?TempSrc: Oral  ?PainSc: 8   ?   ? ?Patients Stated Pain Goal: 9 (08/27/21 0650) ? ?Complications: No notable events documented. ?

## 2021-08-27 NOTE — Anesthesia Procedure Notes (Signed)
Procedure Name: LMA Insertion ?Date/Time: 08/27/2021 7:49 AM ?Performed by: Jonna Munro, CRNA ?Pre-anesthesia Checklist: Patient identified, Emergency Drugs available, Suction available, Patient being monitored and Timeout performed ?Patient Re-evaluated:Patient Re-evaluated prior to induction ?Oxygen Delivery Method: Circle system utilized ?Preoxygenation: Pre-oxygenation with 100% oxygen ?Induction Type: IV induction ?LMA: LMA inserted ?LMA Size: 3.0 ?Number of attempts: 1 ?Placement Confirmation: positive ETCO2 and breath sounds checked- equal and bilateral ?Tube secured with: Tape ?Dental Injury: Teeth and Oropharynx as per pre-operative assessment  ? ? ? ? ?

## 2021-08-27 NOTE — Anesthesia Preprocedure Evaluation (Addendum)
Anesthesia Evaluation  ?Patient identified by MRN, date of birth, ID band ?Patient awake ? ? ? ?Reviewed: ?Allergy & Precautions, NPO status , Patient's Chart, lab work & pertinent test results ? ?Airway ?Mallampati: II ? ?TM Distance: >3 FB ?Neck ROM: Full ? ? ? Dental ? ?(+) Dental Advisory Given, Partial Upper, Missing ?  ?Pulmonary ?neg pulmonary ROS,  ?  ?Pulmonary exam normal ?breath sounds clear to auscultation ? ? ? ? ? ? Cardiovascular ?Exercise Tolerance: Good ?hypertension (not on meds), Normal cardiovascular exam ?Rhythm:Regular Rate:Normal ? ? ?  ?Neuro/Psych ?PSYCHIATRIC DISORDERS Depression negative neurological ROS ?   ? GI/Hepatic ?Neg liver ROS, GERD  Medicated,  ?Endo/Other  ?negative endocrine ROS ? Renal/GU ?Renal disease  ?negative genitourinary ?  ?Musculoskeletal ?negative musculoskeletal ROS ?(+)  ? Abdominal ?  ?Peds ?negative pediatric ROS ?(+)  Hematology ?negative hematology ROS ?(+)   ?Anesthesia Other Findings ? ? Reproductive/Obstetrics ?negative OB ROS ? ?  ? ? ? ? ? ? ? ? ? ? ? ? ? ?  ?  ? ? ? ? ? ? ?Anesthesia Physical ?Anesthesia Plan ? ?ASA: 2 ? ?Anesthesia Plan: General  ? ?Post-op Pain Management: Dilaudid IV  ? ?Induction: Intravenous ? ?PONV Risk Score and Plan: 4 or greater and Ondansetron, Dexamethasone and Midazolam ? ?Airway Management Planned: Oral ETT and LMA ? ?Additional Equipment:  ? ?Intra-op Plan:  ? ?Post-operative Plan: Extubation in OR ? ?Informed Consent: I have reviewed the patients History and Physical, chart, labs and discussed the procedure including the risks, benefits and alternatives for the proposed anesthesia with the patient or authorized representative who has indicated his/her understanding and acceptance.  ? ? ? ?Dental advisory given ? ?Plan Discussed with: CRNA ? ?Anesthesia Plan Comments:   ? ? ? ? ? ?Anesthesia Quick Evaluation ? ?

## 2021-08-27 NOTE — Interval H&P Note (Signed)
History and Physical Interval Note: ? ?08/27/2021 ?7:30 AM ? ?Shelby Guerra  has presented today for surgery, with the diagnosis of bilateral renal calcui.  The various methods of treatment have been discussed with the patient and family. After consideration of risks, benefits and other options for treatment, the patient has consented to  Procedure(s): ?CYSTOSCOPY WITH RETROGRADE PYELOGRAM, URETEROSCOPY AND STENT PLACEMENT (Bilateral) ?HOLMIUM LASER APPLICATION (Bilateral) as a surgical intervention.  The patient's history has been reviewed, patient examined, no change in status, stable for surgery.  I have reviewed the patient's chart and labs.  Questions were answered to the patient's satisfaction.   ? ? ?[name] ? ? ?

## 2021-08-28 ENCOUNTER — Other Ambulatory Visit: Payer: Self-pay | Admitting: Urology

## 2021-08-28 ENCOUNTER — Other Ambulatory Visit: Payer: Self-pay | Admitting: Adult Health

## 2021-08-28 NOTE — Progress Notes (Signed)
I spoke with Shelby Guerra. We have discussed possible surgery dates and 09/13/2021 was agreed upon by all parties. Patient given information about surgery date, what to expect pre-operatively and post operatively.  ?  ?We discussed that a pre-op nurse will be calling to set up the pre-op visit that will take place prior to surgery. Informed patient that our office will communicate any additional care to be provided after surgery.  ?  ?Patients questions or concerns were discussed during our call. Advised to call our office should there be any additional information, questions or concerns that arise. Patient verbalized understanding.   ?

## 2021-08-28 NOTE — H&P (View-Only) (Signed)
Surgical Physician Order [facility] [place] ? ?* Scheduling expectation :  2 weeks ? ?*Length of Case: 30 minutes ? ?*MD Preforming Case: [name], MD ? ?*Assistant Needed: no ? ?*Facility Preference: [facility] ? ?*Clearance needed: no ? ?*Anticoagulation Instructions: Hold all anticoagulants ? ?*Aspirin Instructions: Ok to continue Aspirin ? ?-Admit type: OUTpatient ? ?-Anesthesia: General ? ?-Use Standing Orders:  NA ? ?*Diagnosis: Right Nephrolithiasis ? ?*Procedure: right  Ureteroscopy w/laser lithotripsy & stent exchange (94712) ? ?Additional orders: N/A ? ?-Equipment: Holmium laser, C-arm ?-VTE Prophylaxis Standing Order SCD?s    ?   ?Other:  ? ?-Standing Lab Orders Per Anesthesia   ? ?Lab other: None ? ?-Standing Test orders EKG/Chest x-ray per Anesthesia      ? ?Test other:  ? ?- Medications:  Ancef 2gm IV ? ?-Other orders:  PAS ? ?*Post-op visit Date/Instructions:  1 week cysto stent removal  ?

## 2021-08-28 NOTE — Progress Notes (Signed)
Surgical Physician Order [facility] [place] ? ?* Scheduling expectation :  2 weeks ? ?*Length of Case: 30 minutes ? ?*MD Preforming Case: [name], MD ? ?*Assistant Needed: no ? ?*Facility Preference: [facility] ? ?*Clearance needed: no ? ?*Anticoagulation Instructions: Hold all anticoagulants ? ?*Aspirin Instructions: Ok to continue Aspirin ? ?-Admit type: OUTpatient ? ?-Anesthesia: General ? ?-Use Standing Orders:  NA ? ?*Diagnosis: Right Nephrolithiasis ? ?*Procedure: right  Ureteroscopy w/laser lithotripsy & stent exchange ([id number]) ? ?Additional orders: N/A ? ?-Equipment: Holmium laser, C-arm ?-VTE Prophylaxis Standing Order SCD?s    ?   ?Other:  ? ?-Standing Lab Orders Per Anesthesia   ? ?Lab other: None ? ?-Standing Test orders EKG/Chest x-ray per Anesthesia      ? ?Test other:  ? ?- Medications:  Ancef 2gm IV ? ?-Other orders:  PAS ? ?*Post-op visit Date/Instructions:  1 week cysto stent removal  ?

## 2021-08-29 ENCOUNTER — Encounter (HOSPITAL_COMMUNITY): Payer: Self-pay | Admitting: Urology

## 2021-09-03 ENCOUNTER — Ambulatory Visit: Payer: BC Managed Care – PPO | Admitting: Physician Assistant

## 2021-09-07 NOTE — Patient Instructions (Signed)
? ? ? ? ? ? ? ? Tobias Alexander Akerson ? 09/07/2021  ?  ? '@PREFPERIOPPHARMACY'$ @ ? ? Your procedure is scheduled on 09/13/2021. ? ? Report to [facility] at  67 A.M. ? ? Call this number if you have problems the morning of surgery: ? [phone] ? ? Remember: ? Do not eat or drink after midnight. ?  ?  ? Take these medicines the morning of surgery with A SIP OF WATER  ? ?zofran(if needed), oxycodone(if needed), protonix, zoloft. ? ?  ? Do not wear jewelry, make-up or nail polish. ? Do not wear lotions, powders, or perfumes, or deodorant. ? Do not shave 48 hours prior to surgery.  Men may shave face and neck. ? Do not bring valuables to the hospital. ? [facility] is not responsible for any belongings or valuables. ? ?Contacts, dentures or bridgework may not be worn into surgery.  Leave your suitcase in the car.  After surgery it may be brought to your room. ? ?For patients admitted to the hospital, discharge time will be determined by your treatment team. ? ?Patients discharged the day of surgery will not be allowed to drive home and must have someone with them for 24 hours before your procedure.  ? ? ?Special instructions:   DO NOT smoke tobacco or vape for 24 hours before your procedure. ? ?Please read over the following fact sheets that you were given. ?Anesthesia Post-op Instructions and Care and Recovery After Surgery ?  ? ? ? Ureteral Stent Implantation, Care After ?This sheet gives you information about how to care for yourself after your procedure. Your health care provider may also give you more specific instructions. If you have problems or questions, contact your health care provider. ?What can I expect after the procedure? ?After the procedure, it is common to have: ?Nausea. ?Mild pain when you urinate. You may feel this pain in your lower back or lower abdomen. The pain should stop within a few minutes after you urinate. This may last for up to 1 week. ?A small amount of blood in your urine for several  days. ?Follow these instructions at home: ?Medicines ?Take over-the-counter and prescription medicines only as told by your health care provider. ?If you were prescribed an antibiotic medicine, take it as told by your health care provider. Do not stop taking the antibiotic even if you start to feel better. ?Do not drive for 24 hours if you were given a sedative during your procedure. ?Ask your health care provider if the medicine prescribed to you requires you to avoid driving or using heavy machinery. ?Activity ?Rest as told by your health care provider. ?Avoid sitting for a long time without moving. Get up to take short walks every 1-2 hours. This is important to improve blood flow and breathing. Ask for help if you feel weak or unsteady. ?Return to your normal activities as told by your health care provider. Ask your health care provider what activities are safe for you. ?General instructions ? ?Watch for any blood in your urine. Call your health care provider if the amount of blood in your urine increases. ?If you have a catheter: ?Follow instructions from your health care provider about taking care of your catheter and collection bag. ?Do not take baths, swim, or use a hot tub until your health care provider approves. Ask your health care provider if you may take showers. You may only be allowed to take sponge baths. ?Drink enough fluid to keep your  urine pale yellow. ?Do not use any products that contain nicotine or tobacco, such as cigarettes, e-cigarettes, and chewing tobacco. These can delay healing after surgery. If you need help quitting, ask your health care provider. ?Keep all follow-up visits as told by your health care provider. This is important. ?Contact a health care provider if: ?You have pain that gets worse or does not get better with medicine, especially pain when you urinate. ?You have difficulty urinating. ?You feel nauseous or you vomit repeatedly during a period of more than 2 days after  the procedure. ?Get help right away if: ?Your urine is dark red or has blood clots in it. ?You are leaking urine (have incontinence). ?The end of the stent comes out of your urethra. ?You cannot urinate. ?You have sudden, sharp, or severe pain in your abdomen or lower back. ?You have a fever. ?You have swelling or pain in your legs. ?You have difficulty breathing. ?Summary ?After the procedure, it is common to have mild pain when you urinate that goes away within a few minutes after you urinate. This may last for up to 1 week. ?Watch for any blood in your urine. Call your health care provider if the amount of blood in your urine increases. ?Take over-the-counter and prescription medicines only as told by your health care provider. ?Drink enough fluid to keep your urine pale yellow. ?This information is not intended to replace advice given to you by your health care provider. Make sure you discuss any questions you have with your health care provider. ?Document Revised: 03/03/2018 Document Reviewed: 03/04/2018 ?Elsevier Patient Education ? [place]. ?General Anesthesia, Adult, Care After ?This sheet gives you information about how to care for yourself after your procedure. Your health care provider may also give you more specific instructions. If you have problems or questions, contact your health care provider. ?What can I expect after the procedure? ?After the procedure, the following side effects are common: ?Pain or discomfort at the IV site. ?Nausea. ?Vomiting. ?Sore throat. ?Trouble concentrating. ?Feeling cold or chills. ?Feeling weak or tired. ?Sleepiness and fatigue. ?Soreness and body aches. These side effects can affect parts of the body that were not involved in surgery. ?Follow these instructions at home: ?For the time period you were told by your health care provider: ? ?Rest. ?Do not participate in activities where you could fall or become injured. ?Do not drive or use machinery. ?Do not drink  alcohol. ?Do not take sleeping pills or medicines that cause drowsiness. ?Do not make important decisions or sign legal documents. ?Do not take care of children on your own. ?Eating and drinking ?Follow any instructions from your health care provider about eating or drinking restrictions. ?When you feel hungry, start by eating small amounts of foods that are soft and easy to digest (bland), such as toast. Gradually return to your regular diet. ?Drink enough fluid to keep your urine pale yellow. ?If you vomit, rehydrate by drinking water, juice, or clear broth. ?General instructions ?If you have sleep apnea, surgery and certain medicines can increase your risk for breathing problems. Follow instructions from your health care provider about wearing your sleep device: ?Anytime you are sleeping, including during daytime naps. ?While taking prescription pain medicines, sleeping medicines, or medicines that make you drowsy. ?Have a responsible adult stay with you for the time you are told. It is important to have someone help care for you until you are awake and alert. ?Return to your normal activities as told by  your health care provider. Ask your health care provider what activities are safe for you. ?Take over-the-counter and prescription medicines only as told by your health care provider. ?If you smoke, do not smoke without supervision. ?Keep all follow-up visits as told by your health care provider. This is important. ?Contact a health care provider if: ?You have nausea or vomiting that does not get better with medicine. ?You cannot eat or drink without vomiting. ?You have pain that does not get better with medicine. ?You are unable to pass urine. ?You develop a skin rash. ?You have a fever. ?You have redness around your IV site that gets worse. ?Get help right away if: ?You have difficulty breathing. ?You have chest pain. ?You have blood in your urine or stool, or you vomit blood. ?Summary ?After the procedure, it  is common to have a sore throat or nausea. It is also common to feel tired. ?Have a responsible adult stay with you for the time you are told. It is important to have someone help care for you until you are aw

## 2021-09-10 ENCOUNTER — Encounter (HOSPITAL_COMMUNITY): Payer: Self-pay

## 2021-09-10 ENCOUNTER — Encounter (HOSPITAL_COMMUNITY)
Admission: RE | Admit: 2021-09-10 | Discharge: 2021-09-10 | Disposition: A | Discharge status: Home / Self Care | Payer: BC Managed Care – PPO | Source: Ambulatory Visit | Attending: Urology | Admitting: Urology

## 2021-09-10 DIAGNOSIS — N200 Calculus of kidney: Secondary | ICD-10-CM | POA: Diagnosis not present

## 2021-09-10 DIAGNOSIS — Z01810 Encounter for preprocedural cardiovascular examination: Secondary | ICD-10-CM | POA: Insufficient documentation

## 2021-09-10 DIAGNOSIS — K219 Gastro-esophageal reflux disease without esophagitis: Secondary | ICD-10-CM | POA: Diagnosis not present

## 2021-09-10 DIAGNOSIS — I10 Essential (primary) hypertension: Secondary | ICD-10-CM | POA: Diagnosis not present

## 2021-09-10 DIAGNOSIS — Z79899 Other long term (current) drug therapy: Secondary | ICD-10-CM | POA: Diagnosis not present

## 2021-09-10 HISTORY — DX: Personal history of urinary calculi: Z87.442

## 2021-09-13 ENCOUNTER — Ambulatory Visit (HOSPITAL_COMMUNITY)
Admission: RE | Admit: 2021-09-13 | Discharge: 2021-09-13 | Disposition: A | Discharge status: Home / Self Care | Payer: BC Managed Care – PPO | Attending: Urology | Admitting: Urology

## 2021-09-13 ENCOUNTER — Ambulatory Visit (HOSPITAL_COMMUNITY): Payer: BC Managed Care – PPO | Admitting: Anesthesiology

## 2021-09-13 ENCOUNTER — Encounter (HOSPITAL_COMMUNITY)
Admission: RE | Disposition: A | Discharge status: Home / Self Care | Payer: Self-pay | Source: Home / Self Care | Attending: Urology

## 2021-09-13 ENCOUNTER — Ambulatory Visit (HOSPITAL_COMMUNITY): Payer: BC Managed Care – PPO

## 2021-09-13 DIAGNOSIS — N200 Calculus of kidney: Secondary | ICD-10-CM | POA: Insufficient documentation

## 2021-09-13 DIAGNOSIS — Z79899 Other long term (current) drug therapy: Secondary | ICD-10-CM | POA: Insufficient documentation

## 2021-09-13 DIAGNOSIS — Z87442 Personal history of urinary calculi: Secondary | ICD-10-CM

## 2021-09-13 DIAGNOSIS — K219 Gastro-esophageal reflux disease without esophagitis: Secondary | ICD-10-CM | POA: Insufficient documentation

## 2021-09-13 DIAGNOSIS — I10 Essential (primary) hypertension: Secondary | ICD-10-CM | POA: Insufficient documentation

## 2021-09-13 HISTORY — PX: CYSTOSCOPY WITH RETROGRADE PYELOGRAM, URETEROSCOPY AND STENT PLACEMENT: SHX5789

## 2021-09-13 SURGERY — CYSTOURETEROSCOPY, WITH RETROGRADE PYELOGRAM AND STENT INSERTION
Anesthesia: General | Site: Ureter | Laterality: Right

## 2021-09-13 MED ORDER — CHLORHEXIDINE GLUCONATE 0.12 % MT SOLN
15.0000 mL | Freq: Once | OROMUCOSAL | Status: AC
  Administered 2021-09-13: 15 mL via OROMUCOSAL

## 2021-09-13 MED ORDER — FENTANYL CITRATE (PF) 100 MCG/2ML IJ SOLN
INTRAMUSCULAR | Status: DC | PRN
  Administered 2021-09-13 (×4): 25 ug via INTRAVENOUS

## 2021-09-13 MED ORDER — DIATRIZOATE MEGLUMINE 30 % UR SOLN
URETHRAL | Status: AC
  Filled 2021-09-13: qty 100

## 2021-09-13 MED ORDER — PROPOFOL 10 MG/ML IV BOLUS
INTRAVENOUS | Status: AC
  Filled 2021-09-13: qty 20

## 2021-09-13 MED ORDER — CEFAZOLIN SODIUM-DEXTROSE 2-4 GM/100ML-% IV SOLN
2.0000 g | INTRAVENOUS | Status: AC
  Administered 2021-09-13: 2 g via INTRAVENOUS
  Filled 2021-09-13: qty 100

## 2021-09-13 MED ORDER — MIDAZOLAM HCL 2 MG/2ML IJ SOLN
INTRAMUSCULAR | Status: AC
  Filled 2021-09-13: qty 2

## 2021-09-13 MED ORDER — ONDANSETRON HCL 4 MG/2ML IJ SOLN
INTRAMUSCULAR | Status: DC | PRN
  Administered 2021-09-13: 4 mg via INTRAVENOUS

## 2021-09-13 MED ORDER — HYDROMORPHONE HCL 1 MG/ML IJ SOLN: 0.2500 mg | INTRAMUSCULAR | Status: DC | PRN

## 2021-09-13 MED ORDER — KETOROLAC TROMETHAMINE 15 MG/ML IJ SOLN
INTRAMUSCULAR | Status: DC | PRN
  Administered 2021-09-13: 10 mg via INTRAVENOUS

## 2021-09-13 MED ORDER — EPHEDRINE SULFATE-NACL 50-0.9 MG/10ML-% IV SOSY
PREFILLED_SYRINGE | INTRAVENOUS | Status: DC | PRN
  Administered 2021-09-13 (×2): 5 mg via INTRAVENOUS

## 2021-09-13 MED ORDER — DEXAMETHASONE SODIUM PHOSPHATE 10 MG/ML IJ SOLN
INTRAMUSCULAR | Status: DC | PRN
  Administered 2021-09-13: 10 mg via INTRAVENOUS

## 2021-09-13 MED ORDER — ORAL CARE MOUTH RINSE: 15.0000 mL | Freq: Once | OROMUCOSAL | Status: AC

## 2021-09-13 MED ORDER — MIDAZOLAM HCL 5 MG/5ML IJ SOLN
INTRAMUSCULAR | Status: DC | PRN
  Administered 2021-09-13 (×2): 1 mg via INTRAVENOUS

## 2021-09-13 MED ORDER — PROPOFOL 10 MG/ML IV BOLUS
INTRAVENOUS | Status: DC | PRN
  Administered 2021-09-13: 200 mg via INTRAVENOUS

## 2021-09-13 MED ORDER — DIATRIZOATE MEGLUMINE 30 % UR SOLN
URETHRAL | Status: DC | PRN
  Administered 2021-09-13: 4 mL via URETHRAL

## 2021-09-13 MED ORDER — SODIUM CHLORIDE 0.9 % IR SOLN
Status: DC | PRN
Start: 2021-09-13 — End: 2021-09-13
  Administered 2021-09-13 (×2): 3000 mL

## 2021-09-13 MED ORDER — MEPERIDINE HCL 50 MG/ML IJ SOLN: 6.2500 mg | INTRAMUSCULAR | Status: DC | PRN

## 2021-09-13 MED ORDER — LACTATED RINGERS IV SOLN: INTRAVENOUS | Status: DC

## 2021-09-13 MED ORDER — WATER FOR IRRIGATION, STERILE IR SOLN
Status: DC | PRN
  Administered 2021-09-13: 500 mL

## 2021-09-13 MED ORDER — ONDANSETRON HCL 4 MG/2ML IJ SOLN: 4.0000 mg | Freq: Once | INTRAMUSCULAR | Status: DC | PRN

## 2021-09-13 MED ORDER — KETOROLAC TROMETHAMINE 30 MG/ML IJ SOLN
INTRAMUSCULAR | Status: AC
  Filled 2021-09-13: qty 1

## 2021-09-13 MED ORDER — OXYCODONE-ACETAMINOPHEN 5-325 MG PO TABS: 1.0000 | ORAL_TABLET | ORAL | 0 refills | Status: DC | PRN

## 2021-09-13 MED ORDER — FENTANYL CITRATE (PF) 100 MCG/2ML IJ SOLN
INTRAMUSCULAR | Status: AC
  Filled 2021-09-13: qty 2

## 2021-09-13 SURGICAL SUPPLY — 21 items
BAG DRAIN URO TABLE W/ADPT NS (BAG) ×4 IMPLANT
BAG DRN 8 ADPR NS SKTRN CSTL (BAG) ×2
CATH INTERMIT  6FR 70CM (CATHETERS) ×4 IMPLANT
CLOTH BEACON ORANGE TIMEOUT ST (SAFETY) ×4 IMPLANT
EXTRACTOR STONE NITINOL NGAGE (UROLOGICAL SUPPLIES) ×2 IMPLANT
GLOVE SURG POLYISO LF SZ8 (GLOVE) ×4 IMPLANT
GLOVE SURG UNDER POLY LF SZ7 (GLOVE) ×8 IMPLANT
GOWN STRL REUS W/TWL LRG LVL3 (GOWN DISPOSABLE) ×4 IMPLANT
GOWN STRL REUS W/TWL XL LVL3 (GOWN DISPOSABLE) ×4 IMPLANT
GUIDEWIRE STR DUAL SENSOR (WIRE) ×4 IMPLANT
GUIDEWIRE STR ZIPWIRE 035X150 (MISCELLANEOUS) ×4 IMPLANT
IV NS IRRIG 3000ML ARTHROMATIC (IV SOLUTION) ×8 IMPLANT
KIT TURNOVER CYSTO (KITS) ×4 IMPLANT
MANIFOLD NEPTUNE II (INSTRUMENTS) ×4 IMPLANT
PACK CYSTO (CUSTOM PROCEDURE TRAY) ×4 IMPLANT
PAD ARMBOARD 7.5X6 YLW CONV (MISCELLANEOUS) ×4 IMPLANT
STENT URET 6FRX26 CONTOUR (STENTS) IMPLANT
SYR 10ML LL (SYRINGE) ×4 IMPLANT
SYR CONTROL 10ML LL (SYRINGE) ×4 IMPLANT
TOWEL OR 17X26 4PK STRL BLUE (TOWEL DISPOSABLE) ×4 IMPLANT
WATER STERILE IRR 500ML POUR (IV SOLUTION) ×4 IMPLANT

## 2021-09-13 NOTE — Anesthesia Postprocedure Evaluation (Signed)
Anesthesia Post Note ? ?Patient: Shelby Guerra ? ?Procedure(s) Performed: CYSTOSCOPY WITH RETROGRADE PYELOGRAM, URETEROSCOPY AND STENT REMOVAL (Right: Ureter) ? ?Patient location during evaluation: Phase II ?Anesthesia Type: General ?Level of consciousness: awake and alert and oriented ?Pain management: pain level controlled ?Vital Signs Assessment: post-procedure vital signs reviewed and stable ?Respiratory status: spontaneous breathing, nonlabored ventilation and respiratory function stable ?Cardiovascular status: blood pressure returned to baseline and stable ?Postop Assessment: no apparent nausea or vomiting ?Anesthetic complications: no ? ? ?No notable events documented. ? ? ?Last Vitals:  ?Vitals:  ? 09/13/21 1327 09/13/21 1330  ?BP:  (!) 147/64  ?Pulse: (!) 59   ?Resp: 18   ?Temp: 36.8 ?C   ?SpO2: 96%   ?  ?Last Pain:  ?Vitals:  ? 09/13/21 1330  ?TempSrc:   ?PainSc: 0-No pain  ? ? ?  ?  ?  ?  ?  ?  ? ?Tyse Auriemma C Mariah Gerstenberger ? ? ? ? ?

## 2021-09-13 NOTE — H&P (Signed)
Urology Admission H&P ? ?Chief Complaint: right renal calculi ? ?History of Present Illness: Shelby Guerra is a 66yo here for right ureteroscopic stone extraction. She underwent left stone extraction 2 weeks ago. She currently has bilateral ureteral stent in place. No other complaints today ? ?Past Medical History:  ?Diagnosis Date  ? GERD (gastroesophageal reflux disease)   ? High cholesterol   ? History of kidney stones   ? Hot flashes 03/10/2013  ? Hypertension   ? pt denies  ? Impaired fasting glucose   ? [place] 03/14/2014  ? ?Past Surgical History:  ?Procedure Laterality Date  ? COLONOSCOPY  12/13/2008  ? COLONOSCOPY N/A 03/05/2019  ? Procedure: COLONOSCOPY;  Surgeon: Daneil Dolin, MD;  [facility];  Service: Endoscopy;  Laterality: N/A;  10:30  ? CYSTOSCOPY WITH RETROGRADE PYELOGRAM, URETEROSCOPY AND STENT PLACEMENT Bilateral 08/27/2021  ? Procedure: BILATERAL RETROGRADE PYELOGRAM, BILATERAL URETEROSCOPY;  Surgeon: Cleon Gustin, MD;  Location: [facility];  Service: Urology;  Laterality: Bilateral;  ? CYSTOSCOPY WITH STENT PLACEMENT Right 08/27/2021  ? Procedure: CYSTOSCOPY WITH STENT PLACEMENT;  Surgeon: Cleon Gustin, MD;  Location: [facility];  Service: Urology;  Laterality: Right;  ? INGUINAL HERNIA REPAIR Left 02/26/2006  ? recurrent  ? INGUINAL HERNIA REPAIR Left   ? prior to 2007  ? KNEE ARTHROSCOPY WITH MEDIAL MENISECTOMY Left 12/04/2017  ? Procedure: KNEE ARTHROSCOPY WITH MEDIAL MENISCECTOMY AND CHONDROPLASTY;  Surgeon: Hiram Gash, MD;  Location: [facility];  Service: Orthopedics;  Laterality: Left;  NO BLOCK  ? POLYPECTOMY  03/05/2019  ? Procedure: POLYPECTOMY;  Surgeon: Daneil Dolin, MD;  Location: [facility];  Service: Endoscopy;;  ? STONE EXTRACTION WITH BASKET Bilateral 08/27/2021  ? Procedure: STONE EXTRACTION WITH BASKET;  Surgeon: Cleon Gustin, MD;  Location: [facility];  Service: Urology;  Laterality: Bilateral;  ? TOTAL ABDOMINAL HYSTERECTOMY W/ BILATERAL  SALPINGOOPHORECTOMY  10/15/2006  ? TUBAL LIGATION    ? ? ?Home Medications:  ?Current Facility-Administered Medications  ?Medication Dose Route Frequency Provider Last Rate Last Admin  ? ceFAZolin (ANCEF) IVPB 2g/100 mL premix  2 g Intravenous 30 min Pre-Op Cleon Gustin, MD      ? lactated ringers infusion   Intravenous Continuous Denese Killings, MD 50 mL/hr at 09/13/21 1119 New Bag at 09/13/21 1119  ? ?Allergies:  ?Allergies  ?Allergen Reactions  ? Codeine Itching  ?  Can take vicodin  ? Propoxyphene N-Acetaminophen Nausea Only  ?  Darvocet  ? ? ?Family History  ?Problem Relation Age of Onset  ? Hypertension Brother   ? Kidney failure Brother   ?     had kidney transplant  ? Hyperlipidemia Brother   ? Hypertension Sister   ? Hyperlipidemia Sister   ? Cancer Sister   ?     cervical  ? Hypertension Brother   ? Kidney failure Father   ? Cancer Mother   ? Colon cancer Neg Hx   ? ?Social History:  reports that she has never smoked. She has never used smokeless tobacco. She reports that she does not drink alcohol and does not use drugs. ? ?Review of Systems  ?Genitourinary:  Positive for flank pain, frequency and urgency.  ?All other systems reviewed and are negative. ? ?Physical Exam:  ?Vital signs in last 24 hours: ?Temp:  [98.2 ?F (36.8 ?C)] 98.2 ?F (36.8 ?C) (04/06 1110) ?Pulse Rate:  [48] 48 (04/06 1110) ?Resp:  [18] 18 (04/06 1110) ?BP: (131)/(68) 131/68 (  04/06 1110) ?SpO2:  [100 %] 100 % (04/06 1110) ?Physical Exam ?Constitutional:   ?   Appearance: Normal appearance.  ?HENT:  ?   Head: Normocephalic and atraumatic.  ?   Nose: Nose normal. No congestion.  ?   Mouth/Throat:  ?   Mouth: Mucous membranes are dry.  ?Eyes:  ?   Extraocular Movements: Extraocular movements intact.  ?   Pupils: Pupils are equal, round, and reactive to light.  ?Cardiovascular:  ?   Rate and Rhythm: Normal rate and regular rhythm.  ?Pulmonary:  ?   Effort: Pulmonary effort is normal. No respiratory distress.  ?Abdominal:  ?    General: Abdomen is flat. There is no distension.  ?Musculoskeletal:     ?   General: No swelling. Normal range of motion.  ?   Cervical back: Normal range of motion and neck supple.  ?Skin: ?   General: Skin is warm and dry.  ?Neurological:  ?   General: No focal deficit present.  ?   Mental Status: She is alert and oriented to person, place, and time.  ?Psychiatric:     ?   Mood and Affect: Mood normal.     ?   Behavior: Behavior normal.     ?   Thought Content: Thought content normal.     ?   Judgment: Judgment normal.  ? ? ?Laboratory Data:  ?No results found for this or any previous visit (from the past 24 hour(s)). ?No results found for this or any previous visit (from the past 240 hour(s)). ?Creatinine: ?No results for input(s): CREATININE in the last 168 hours. ?Baseline Creatinine: unknown ? ?Impression/Assessment:  ?65yo with right renal calculi ? ?Plan:  ?-We discussed the management of kidney stones. These options include observation, ureteroscopy, shockwave lithotripsy (ESWL) and percutaneous nephrolithotomy (PCNL). We discussed which options are relevant to the patient's stone(s). We discussed the natural history of kidney stones as well as the complications of untreated stones and the impact on quality of life without treatment as well as with each of the above listed treatments. We also discussed the efficacy of each treatment in its ability to clear the stone burden. With any of these management options I discussed the signs and symptoms of infection and the need for emergent treatment should these be experienced. For each option we discussed the ability of each procedure to clear the patient of their stone burden.  ? ?For observation I described the risks which include but are not limited to silent renal damage, life-threatening infection, need for emergent surgery, failure to pass stone and pain.  ? ?For ureteroscopy I described the risks which include bleeding, infection, damage to contiguous  structures, positioning injury, ureteral stricture, ureteral avulsion, ureteral injury, need for prolonged ureteral stent, inability to perform ureteroscopy, need for an interval procedure, inability to clear stone burden, stent discomfort/pain, heart attack, stroke, pulmonary embolus and the inherent risks with general anesthesia.  ? ?For shockwave lithotripsy I described the risks which include arrhythmia, kidney contusion, kidney hemorrhage, need for transfusion, pain, inability to adequately break up stone, inability to pass stone fragments, Steinstrasse, infection associated with obstructing stones, need for alternate surgical procedure, need for repeat shockwave lithotripsy, MI, CVA, PE and the inherent risks with anesthesia/conscious sedation.  ? ?For PCNL I described the risks including positioning injury, pneumothorax, hydrothorax, need for chest tube, inability to clear stone burden, renal laceration, arterial venous fistula or malformation, need for embolization of kidney, loss of kidney or renal function,  need for repeat procedure, need for prolonged nephrostomy tube, ureteral avulsion, MI, CVA, PE and the inherent risks of general anesthesia.  ? ?- The patient would like to proceed with right ureteroscopic stone extraction ? ?[name] ?09/13/2021, 11:49 AM  ? ? ? ? ? ?

## 2021-09-13 NOTE — Anesthesia Procedure Notes (Signed)
Procedure Name: Intubation ?Date/Time: 09/13/2021 12:15 PM ?Performed by: Maude Leriche, CRNA ?Pre-anesthesia Checklist: Patient identified, Emergency Drugs available, Suction available and Patient being monitored ?Patient Re-evaluated:Patient Re-evaluated prior to induction ?Oxygen Delivery Method: Circle system utilized ?Preoxygenation: Pre-oxygenation with 100% oxygen ?Induction Type: IV induction ?Ventilation: Mask ventilation without difficulty ?LMA: LMA inserted ?LMA Size: 4.0 ?Tube type: Oral ?Number of attempts: 1 ?Placement Confirmation: positive ETCO2 and breath sounds checked- equal and bilateral ?Tube secured with: Tape ?Dental Injury: Teeth and Oropharynx as per pre-operative assessment  ? ? ? ? ?

## 2021-09-13 NOTE — Anesthesia Preprocedure Evaluation (Addendum)
Anesthesia Evaluation  ?Patient identified by MRN, date of birth, ID band ?Patient awake ? ? ? ?Reviewed: ?Allergy & Precautions, NPO status , Patient's Chart, lab work & pertinent test results ? ?Airway ?Mallampati: II ? ?TM Distance: >3 FB ?Neck ROM: Full ? ? ? Dental ? ?(+) Dental Advisory Given, Upper Dentures, Missing ?  ?Pulmonary ?neg pulmonary ROS,  ?  ?Pulmonary exam normal ?breath sounds clear to auscultation ? ? ? ? ? ? Cardiovascular ?Exercise Tolerance: Good ?hypertension, Pt. on medications ?Normal cardiovascular exam ?Rhythm:Regular Rate:Normal ? ? ?  ?Neuro/Psych ?PSYCHIATRIC DISORDERS Depression negative neurological ROS ?   ? GI/Hepatic ?Neg liver ROS, GERD  Medicated and Controlled,  ?Endo/Other  ?negative endocrine ROS ? Renal/GU ?negative Renal ROS  ?negative genitourinary ?  ?Musculoskeletal ?negative musculoskeletal ROS ?(+)  ? Abdominal ?  ?Peds ?negative pediatric ROS ?(+)  Hematology ?negative hematology ROS ?(+)   ?Anesthesia Other Findings ? ? Reproductive/Obstetrics ?negative OB ROS ? ?  ? ? ? ? ? ? ? ? ? ? ? ? ? ?  ?  ? ? ? ? ? ? ? ?Anesthesia Physical ?Anesthesia Plan ? ?ASA: 2 ? ?Anesthesia Plan: General  ? ?Post-op Pain Management: Dilaudid IV  ? ?Induction: Intravenous ? ?PONV Risk Score and Plan: 4 or greater and Ondansetron and Dexamethasone ? ?Airway Management Planned: LMA ? ?Additional Equipment:  ? ?Intra-op Plan:  ? ?Post-operative Plan:  ? ?Informed Consent: I have reviewed the patients History and Physical, chart, labs and discussed the procedure including the risks, benefits and alternatives for the proposed anesthesia with the patient or authorized representative who has indicated his/her understanding and acceptance.  ? ? ? ?Dental advisory given ? ?Plan Discussed with: CRNA and Surgeon ? ?Anesthesia Plan Comments:   ? ? ? ? ? ?Anesthesia Quick Evaluation ? ?

## 2021-09-13 NOTE — Op Note (Signed)
Preoperative diagnosis: Right renal calculi ? ?Postoperative diagnosis: Same ? ?Procedure: 1 cystoscopy ?2.  right retrograde pyelography ?3.  Intraoperative fluoroscopy, under one hour, with interpretation ?4.  Right diagnostic ureteroscopy ?5. Right ureteral stent removal ? ?Attending: [name] ? ?Anesthesia: General ? ?Estimated blood loss: None ? ?Drains: none ? ?Specimens: none ? ?Antibiotics: ancef ? ?Findings: no right hydronephrosis. No right renal calculi visualized. ? ?Indications: Patient is a 66 year old female with a history of right renal calculi who underwent stent placement 2 weeks ago due to UPJ narrowing.  After discussing treatment options, she decided proceed with right ureteroscopic stone extraction. ? ?Procedure in detail: The patient was brought to the operating room and a brief timeout was done to ensure correct patient, correct procedure, correct site.  General anesthesia was administered patient was placed in dorsal lithotomy position.  Her genitalia was then prepped and draped in usual sterile fashion.  A rigid 76 French cystoscope was passed in the urethra and the bladder.  Bladder was inspected free masses or lesions.  the ureteral orifices were in the normal orthotopic locations.  a 6 french ureteral catheter was then instilled into the right ureter orifice.  a gentle retrograde was obtained and findings noted above.  Using a grasper the right ureteral stent was brought to the urethral meatus. we then placed a zip wire through the ureteral stent and advanced up to the renal pelvis. We then removed the stent.   we then removed the cystoscope and cannulated the right ureteral orifice with a semirigid ureteroscope.  we then performed ureteroscopy up to the level of the UPJ. No stone or tumor was encountered. We then advanced a sensor wire into the renal pelvis. We then removed the scope and advanced a flexible ureteroscope over the sensor and up to the renal pelvis. We performed  nephroscopy and were unable to locate any calculi. She had multiple papillary tip calcification. We then removed the scope and the wire. We elected not to place a stent since this was an uncomplicated ureteroscopy. the bladder was then drained and this concluded the procedure which was well tolerated by patient. ? ?Complications: None ? ?Condition: Stable, extubated, transferred to PACU ? ?Plan: Pt is to followup in 2 weeks ? ?

## 2021-09-13 NOTE — Transfer of Care (Signed)
Immediate Anesthesia Transfer of Care Note ? ?Patient: Emree Locicero Coykendall ? ?Procedure(s) Performed: CYSTOSCOPY WITH RETROGRADE PYELOGRAM, URETEROSCOPY AND STENT REMOVAL (Right: Ureter) ? ?Patient Location: PACU ? ?Anesthesia Type:General ? ?Level of Consciousness: awake, alert  and oriented ? ?Airway & Oxygen Therapy: Patient Spontanous Breathing ? ?Post-op Assessment: Report given to RN, Post -op Vital signs reviewed and stable, Patient moving all extremities X 4 and Patient able to stick tongue midline ? ?Post vital signs: Reviewed ? ?Last Vitals:  ?Vitals Value Taken Time  ?BP 129/62   ?Temp 97.6   ?Pulse 71   ?Resp 15 09/13/21 1304  ?SpO2 99   ?Vitals shown include unvalidated device data. ? ?Last Pain:  ?Vitals:  ? 09/13/21 1110  ?TempSrc: Oral  ?PainSc: 7   ?   ? ?Patients Stated Pain Goal: 7 (09/13/21 1110) ? ?Complications: No notable events documented. ?

## 2021-09-13 NOTE — Interval H&P Note (Signed)
History and Physical Interval Note: ? ?09/13/2021 ?11:48 AM ? ?[place]  has presented today for surgery, with the diagnosis of right nephrolithiasis.  The various methods of treatment have been discussed with the patient and family. After consideration of risks, benefits and other options for treatment, the patient has consented to  Procedure(s): ?CYSTOSCOPY WITH RETROGRADE PYELOGRAM, URETEROSCOPY AND STENT EXCHANGE (Right) ?HOLMIUM LASER APPLICATION (Right) as a surgical intervention.  The patient's history has been reviewed, patient examined, no change in status, stable for surgery.  I have reviewed the patient's chart and labs.  Questions were answered to the patient's satisfaction.   ? ? ?Shelby Guerra ? ? ?

## 2021-09-19 ENCOUNTER — Encounter (HOSPITAL_COMMUNITY): Payer: Self-pay | Admitting: Urology

## 2021-09-24 ENCOUNTER — Ambulatory Visit (INDEPENDENT_AMBULATORY_CARE_PROVIDER_SITE_OTHER): Payer: BC Managed Care – PPO | Admitting: Urology

## 2021-09-24 ENCOUNTER — Encounter: Payer: Self-pay | Admitting: Urology

## 2021-09-24 VITALS — BP 149/79 | HR 53

## 2021-09-24 DIAGNOSIS — Z87442 Personal history of urinary calculi: Secondary | ICD-10-CM

## 2021-09-24 NOTE — Progress Notes (Signed)
? ?09/24/2021 ?1:33 PM  ? ?Shelby Guerra ?Jul 25, 1955 ?[id number] ? ?Referring provider: Denny Levy, PA ?[address] ?[place],  [place] [address] ? ?Followup nephrolithiasis ? ? ?HPI: ?Shelby Guerra is a 66yo here for followup for nephrolithiasis. She has been doing well since her bilateral ureteroscopy. She denies any flank pain. She denies any worsening LUTS. She drinks 16oz of water daily. No other complaitns today ? ? ?PMH: ?Past Medical History:  ?Diagnosis Date  ? GERD (gastroesophageal reflux disease)   ? High cholesterol   ? History of kidney stones   ? Hot flashes 03/10/2013  ? Hypertension   ? pt denies  ? Impaired fasting glucose   ? [place] 03/14/2014  ? ? ?Surgical History: ?Past Surgical History:  ?Procedure Laterality Date  ? COLONOSCOPY  12/13/2008  ? COLONOSCOPY N/A 03/05/2019  ? Procedure: COLONOSCOPY;  Surgeon: Daneil Dolin, MD;  [facility];  Service: Endoscopy;  Laterality: N/A;  10:30  ? CYSTOSCOPY WITH RETROGRADE PYELOGRAM, URETEROSCOPY AND STENT PLACEMENT Bilateral 08/27/2021  ? Procedure: BILATERAL RETROGRADE PYELOGRAM, BILATERAL URETEROSCOPY;  Surgeon: Cleon Gustin, MD;  Location: [facility];  Service: Urology;  Laterality: Bilateral;  ? CYSTOSCOPY WITH RETROGRADE PYELOGRAM, URETEROSCOPY AND STENT PLACEMENT Right 09/13/2021  ? Procedure: CYSTOSCOPY WITH RETROGRADE PYELOGRAM, URETEROSCOPY AND STENT REMOVAL;  Surgeon: Cleon Gustin, MD;  Location: [facility];  Service: Urology;  Laterality: Right;  ? CYSTOSCOPY WITH STENT PLACEMENT Right 08/27/2021  ? Procedure: CYSTOSCOPY WITH STENT PLACEMENT;  Surgeon: Cleon Gustin, MD;  Location: [facility];  Service: Urology;  Laterality: Right;  ? INGUINAL HERNIA REPAIR Left 02/26/2006  ? recurrent  ? INGUINAL HERNIA REPAIR Left   ? prior to 2007  ? KNEE ARTHROSCOPY WITH MEDIAL MENISECTOMY Left 12/04/2017  ? Procedure: KNEE ARTHROSCOPY WITH MEDIAL MENISCECTOMY AND CHONDROPLASTY;  Surgeon: Hiram Gash, MD;  Location: [facility];   Service: Orthopedics;  Laterality: Left;  NO BLOCK  ? POLYPECTOMY  03/05/2019  ? Procedure: POLYPECTOMY;  Surgeon: Daneil Dolin, MD;  [facility]: [facility];  Service: Endoscopy;;  ? STONE EXTRACTION WITH BASKET Bilateral 08/27/2021  ? Procedure: STONE EXTRACTION WITH BASKET;  Surgeon: Cleon Gustin, MD;  Location: [facility];  Service: Urology;  Laterality: Bilateral;  ? TOTAL ABDOMINAL HYSTERECTOMY W/ BILATERAL SALPINGOOPHORECTOMY  10/15/2006  ? TUBAL LIGATION    ? ? ?Home Medications:  ?Allergies as of 09/24/2021   ? ?   Reactions  ? Codeine Itching  ? Can take vicodin  ? Propoxyphene N-acetaminophen Nausea Only  ? Darvocet  ? ?  ? ?  ?Medication List  ?  ? ?  ? Accurate as of September 24, 2021  1:33 PM. If you have any questions, ask your nurse or doctor.  ?  ?  ? ?  ? ?acetaminophen 500 MG tablet ?Commonly known as: TYLENOL ?Take 1,000 mg by mouth every 6 (six) hours as needed for mild pain. ?  ?Centrum Silver 50+Women Tabs ?Take 1 ddaily ?What changed:  ?how much to take ?how to take this ?when to take this ?additional instructions ?  ?ondansetron 4 MG tablet ?Commonly known as: Zofran ?Take 1 tablet (4 mg total) by mouth daily as needed for nausea or vomiting. ?  ?oxyCODONE-acetaminophen 5-325 MG tablet ?Commonly known as: Percocet ?Take 1 tablet by mouth every 4 (four) hours as needed. ?  ?pantoprazole 20 MG tablet ?Commonly known as: Protonix ?Take 1 tablet (20 mg total) by mouth daily. ?  ?sertraline  50 MG tablet ?Commonly known as: ZOLOFT ?TAKE ONE (1) TABLET BY MOUTH EVERY DAY ?  ?tamsulosin 0.4 MG Caps capsule ?Commonly known as: Flomax ?Take 1 capsule (0.4 mg total) by mouth daily after supper. ?  ? ?  ? ? ?Allergies:  ?Allergies  ?Allergen Reactions  ? Codeine Itching  ?  Can take vicodin  ? Propoxyphene N-Acetaminophen Nausea Only  ?  Darvocet  ? ? ?Family History: ?Family History  ?Problem Relation Age of Onset  ? Hypertension Brother   ? Kidney failure Brother   ?     had kidney transplant  ?  Hyperlipidemia Brother   ? Hypertension Sister   ? Hyperlipidemia Sister   ? Cancer Sister   ?     cervical  ? Hypertension Brother   ? Kidney failure Father   ? Cancer Mother   ? Colon cancer Neg Hx   ? ? ?Social History:  reports that she has never smoked. She has never used smokeless tobacco. She reports that she does not drink alcohol and does not use drugs. ? ?ROS: ?All other review of systems were reviewed and are negative except what is noted above in HPI ? ?Physical Exam: ?BP (!) 149/79   Pulse (!) 53   ?Constitutional:  Alert and oriented, No acute distress. ?HEENT: [place] AT, moist mucus membranes.  Trachea midline, no masses. ?Cardiovascular: No clubbing, cyanosis, or edema. ?Respiratory: Normal respiratory effort, no increased work of breathing. ?GI: Abdomen is soft, nontender, nondistended, no abdominal masses ?GU: No CVA tenderness.  ?Lymph: No cervical or inguinal lymphadenopathy. ?Skin: No rashes, bruises or suspicious lesions. ?Neurologic: Grossly intact, no focal deficits, moving all 4 extremities. ?Psychiatric: Normal mood and affect. ? ?Laboratory Data: ?Lab Results  ?Component Value Date  ? WBC 5.6 06/12/2021  ? HGB 13.4 06/12/2021  ? HCT 38.7 06/12/2021  ? MCV 83 06/12/2021  ? PLT 213 06/12/2021  ? ? ?Lab Results  ?Component Value Date  ? CREATININE 0.78 06/12/2021  ? ? ?No results found for: PSA ? ?No results found for: TESTOSTERONE ? ?No results found for: HGBA1C ? ?Urinalysis ?   ?Component Value Date/Time  ? COLORURINE AMBER (A) 05/09/2018 0949  ? APPEARANCEUR Clear 08/10/2021 0837  ? LABSPEC 1.014 05/09/2018 0949  ? PHURINE 5.0 05/09/2018 0949  ? GLUCOSEU Negative 08/10/2021 0837  ? HGBUR SMALL (A) 05/09/2018 0949  ? BILIRUBINUR Negative 08/10/2021 0837  ? KETONESUR negative 01/29/2020 1414  ? [place] NEGATIVE 05/09/2018 0949  ? PROTEINUR Negative 08/10/2021 0837  ? [place] NEGATIVE 05/09/2018 0949  ? UROBILINOGEN 0.2 01/29/2020 1414  ? UROBILINOGEN 0.2 01/29/2015 2352  ? NITRITE Negative  08/10/2021 0837  ? NITRITE POSITIVE (A) 05/09/2018 0949  ? LEUKOCYTESUR Trace (A) 08/10/2021 0623  ? ? ?Lab Results  ?Component Value Date  ? LABMICR See below: 08/10/2021  ? WBCUA 6-10 (A) 08/10/2021  ? RBCUA 0-2 12/17/2016  ? LABEPIT 0-10 08/10/2021  ? MUCUS Present 05/31/2019  ? BACTERIA Moderate (A) 08/10/2021  ? ? ?Pertinent Imaging: ? ?No results found for this or any previous visit. ? ?No results found for this or any previous visit. ? ?No results found for this or any previous visit. ? ?No results found for this or any previous visit. ? ?No results found for this or any previous visit. ? ?No results found for this or any previous visit. ? ?No results found for this or any previous visit. ? ?Results for orders placed in visit on 08/06/21 ? ?CT RENAL STONE  STUDY ? ?Narrative ?CLINICAL DATA:  Acute left flank pain. ? ?EXAM: ?CT ABDOMEN AND PELVIS WITHOUT CONTRAST ? ?TECHNIQUE: ?Multidetector CT imaging of the abdomen and pelvis was performed ?following the standard protocol without IV contrast. ? ?RADIATION DOSE REDUCTION: This exam was performed according to the ?departmental dose-optimization program which includes automated ?exposure control, adjustment of the mA and/or kV according to ?patient size and/or use of iterative reconstruction technique. ? ?COMPARISON:  February 09, 2010. ? ?FINDINGS: ?Lower chest: No acute abnormality. ? ?Hepatobiliary: No focal liver abnormality is seen. No gallstones, ?gallbladder wall thickening, or biliary dilatation. ? ?Pancreas: Unremarkable. No pancreatic ductal dilatation or ?surrounding inflammatory changes. ? ?Spleen: Normal in size without focal abnormality. ? ?Adrenals/Urinary Tract: Adrenal glands appear normal. Bilateral ?nephrolithiasis is noted. No hydronephrosis or renal obstruction is ?noted. Urinary bladder is unremarkable. ? ?Stomach/Bowel: Stomach is within normal limits. Appendix appears ?normal. No evidence of bowel wall thickening, distention,  or ?inflammatory changes. ? ?Vascular/Lymphatic: Aortic atherosclerosis. No enlarged abdominal or ?pelvic lymph nodes. ? ?Reproductive: Status post hysterectomy. No adnexal masses. ? ?Other: Small fat containing righ

## 2021-09-24 NOTE — Patient Instructions (Signed)
Dietary Guidelines to Help Prevent Kidney Stones Kidney stones are deposits of minerals and salts that form inside your kidneys. Your risk of developing kidney stones may be greater depending on your diet, your lifestyle, the medicines you take, and whether you have certain medical conditions. Most people can lower their chances of developing kidney stones by following the instructions below. Your dietitian may give you more specific instructions depending on your overall health and the type of kidney stones you tend to develop. What are tips for following this plan? Reading food labels  Choose foods with "no salt added" or "low-salt" labels. Limit your salt (sodium) intake to less than 1,500 mg a day. Choose foods with calcium for each meal and snack. Try to eat about 300 mg of calcium at each meal. Foods that contain 200-500 mg of calcium a serving include: 8 oz (237 mL) of milk, calcium-fortifiednon-dairy milk, and calcium-fortifiedfruit juice. Calcium-fortified means that calcium has been added to these drinks. 8 oz (237 mL) of kefir, yogurt, and soy yogurt. 4 oz (114 g) of tofu. 1 oz (28 g) of cheese. 1 cup (150 g) of dried figs. 1 cup (91 g) of cooked broccoli. One 3 oz (85 g) can of sardines or mackerel. Most people need 1,000-1,500 mg of calcium a day. Talk to your dietitian about how much calcium is recommended for you. Shopping Buy plenty of fresh fruits and vegetables. Most people do not need to avoid fruits and vegetables, even if these foods contain nutrients that may contribute to kidney stones. When shopping for convenience foods, choose: Whole pieces of fruit. Pre-made salads with dressing on the side. Low-fat fruit and yogurt smoothies. Avoid buying frozen meals or prepared deli foods. These can be high in sodium. Look for foods with live cultures, such as yogurt and kefir. Choose high-fiber grains, such as whole-wheat breads, oat bran, and wheat cereals. Cooking Do not add  salt to food when cooking. Place a salt shaker on the table and allow each person to add his or her own salt to taste. Use vegetable protein, such as beans, textured vegetable protein (TVP), or tofu, instead of meat in pasta, casseroles, and soups. Meal planning Eat less salt, if told by your dietitian. To do this: Avoid eating processed or pre-made food. Avoid eating fast food. Eat less animal protein, including cheese, meat, poultry, or fish, if told by your dietitian. To do this: Limit the number of times you have meat, poultry, fish, or cheese each week. Eat a diet free of meat at least 2 days a week. Eat only one serving each day of meat, poultry, fish, or seafood. When you prepare animal protein, cut pieces into small portion sizes. For most meat and fish, one serving is about the size of the palm of your hand. Eat at least five servings of fresh fruits and vegetables each day. To do this: Keep fruits and vegetables on hand for snacks. Eat one piece of fruit or a handful of berries with breakfast. Have a salad and fruit at lunch. Have two kinds of vegetables at dinner. Limit foods that are high in a substance called oxalate. These include: Spinach (cooked), rhubarb, beets, sweet potatoes, and Swiss chard. Peanuts. Potato chips, french fries, and baked potatoes with skin on. Nuts and nut products. Chocolate. If you regularly take a diuretic medicine, make sure to eat at least 1 or 2 servings of fruits or vegetables that are high in potassium each day. These include: Avocado. Banana. Orange, prune,   carrot, or tomato juice. Baked potato. Cabbage. Beans and split peas. Lifestyle  Drink enough fluid to keep your urine pale yellow. This is the most important thing you can do. Spread your fluid intake throughout the day. If you drink alcohol: Limit how much you use to: 0-1 drink a day for women who are not pregnant. 0-2 drinks a day for men. Be aware of how much alcohol is in your  drink. In the [place] one drink equals one 12 oz bottle of beer (355 mL), one 5 oz glass of wine (148 mL), or one 1 oz glass of hard liquor (44 mL). Lose weight if told by your health care provider. Work with your dietitian to find an eating plan and weight loss strategies that work best for you. General information Talk to your health care provider and dietitian about taking daily supplements. You may be told the following depending on your health and the cause of your kidney stones: Not to take supplements with vitamin C. To take a calcium supplement. To take a daily probiotic supplement. To take other supplements such as magnesium, fish oil, or vitamin B6. Take over-the-counter and prescription medicines only as told by your health care provider. These include supplements. What foods should I limit? Limit your intake of the following foods, or eat them as told by your dietitian. Vegetables Spinach. Rhubarb. Beets. Canned vegetables. Pickles. Olives. Baked potatoes with skin. Grains Wheat bran. Baked goods. Salted crackers. Cereals high in sugar. Meats and other proteins Nuts. Nut butters. Large portions of meat, poultry, or fish. Salted, precooked, or cured meats, such as sausages, meat loaves, and hot dogs. Dairy Cheese. Beverages Regular soft drinks. Regular vegetable juice. Seasonings and condiments Seasoning blends with salt. Salad dressings. Soy sauce. Ketchup. Barbecue sauce. Other foods Canned soups. Canned pasta sauce. Casseroles. Pizza. Lasagna. Frozen meals. Potato chips. French fries. The items listed above may not be a complete list of foods and beverages you should limit. Contact a dietitian for more information. What foods should I avoid? Talk to your dietitian about specific foods you should avoid based on the type of kidney stones you have and your overall health. Fruits Grapefruit. The item listed above may not be a complete list of foods and beverages you should  avoid. Contact a dietitian for more information. Summary Kidney stones are deposits of minerals and salts that form inside your kidneys. You can lower your risk of kidney stones by making changes to your diet. The most important thing you can do is drink enough fluid. Drink enough fluid to keep your urine pale yellow. Talk to your dietitian about how much calcium you should have each day, and eat less salt and animal protein as told by your dietitian. This information is not intended to replace advice given to you by your health care provider. Make sure you discuss any questions you have with your health care provider. Document Revised: 02/05/2021 Document Reviewed: 02/05/2021 Elsevier Patient Education  2023 [employer]  

## 2021-09-25 LAB — URINALYSIS, ROUTINE W REFLEX MICROSCOPIC
Bilirubin, UA: NEGATIVE
Glucose, UA: NEGATIVE
Ketones, UA: NEGATIVE
Nitrite, UA: NEGATIVE
Protein,UA: NEGATIVE
Specific Gravity, UA: 1.015 (ref 1.005–1.030)
Urobilinogen, Ur: 0.2 mg/dL (ref 0.2–1.0)
pH, UA: 6.5 (ref 5.0–7.5)

## 2021-09-25 LAB — MICROSCOPIC EXAMINATION: Renal Epithel, UA: NONE SEEN /hpf

## 2021-10-03 ENCOUNTER — Telehealth: Payer: Self-pay

## 2021-10-03 ENCOUNTER — Other Ambulatory Visit: Payer: BC Managed Care – PPO | Admitting: Urology

## 2021-10-03 NOTE — Telephone Encounter (Signed)
Patient called advising that after her procedure to remove kidney stones she is having issues with constipation issues. She wanted to know if there was an over the counter laxative that you recommended.  ?

## 2021-10-03 NOTE — Telephone Encounter (Signed)
Patient advised to try OTC medication for constipation. Patient informed that if she got no relief to notify PCP. Patient voiced understanding. ?

## 2021-10-24 ENCOUNTER — Ambulatory Visit (HOSPITAL_COMMUNITY)
Admission: RE | Admit: 2021-10-24 | Discharge: 2021-10-24 | Disposition: A | Discharge status: Home / Self Care | Payer: BC Managed Care – PPO | Source: Ambulatory Visit | Attending: Urology | Admitting: Urology

## 2021-10-24 DIAGNOSIS — Z87442 Personal history of urinary calculi: Secondary | ICD-10-CM | POA: Diagnosis present

## 2021-10-30 ENCOUNTER — Ambulatory Visit: Payer: BC Managed Care – PPO | Admitting: Urology

## 2021-10-31 ENCOUNTER — Ambulatory Visit (INDEPENDENT_AMBULATORY_CARE_PROVIDER_SITE_OTHER): Payer: BC Managed Care – PPO | Admitting: Urology

## 2021-10-31 ENCOUNTER — Encounter: Payer: Self-pay | Admitting: Urology

## 2021-10-31 VITALS — BP 147/75 | HR 55

## 2021-10-31 DIAGNOSIS — Z87442 Personal history of urinary calculi: Secondary | ICD-10-CM | POA: Diagnosis not present

## 2021-10-31 NOTE — Progress Notes (Signed)
10/31/2021 9:29 AM   Shelby Guerra April 02, 1956 [id number]  Referring provider: Denny Levy, [address] [place],  [place] [address]  Followup nephrolithiasis   HPI: Shelby Guerra is a 66yo here for followup for nephrolithiasis. No stone events since last visit. Renal US 5/17 shows no hydronephrosis and a 70m right renal nonshadowing calcification. NO significant LUTS. No flank pain. She is increased her water intake to 40oz NO other complaints today.   PMH: Past Medical History:  Diagnosis Date   GERD (gastroesophageal reflux disease)    High cholesterol    History of kidney stones    Hot flashes 03/10/2013   Hypertension    pt denies   Impaired fasting glucose    Moody 03/14/2014    Surgical History: Past Surgical History:  Procedure Laterality Date   COLONOSCOPY  12/13/2008   COLONOSCOPY N/A 03/05/2019   Procedure: COLONOSCOPY;  Surgeon: RDaneil Dolin MD;  [facility];  Service: Endoscopy;  Laterality: N/A;  10:30   CYSTOSCOPY WITH RETROGRADE PYELOGRAM, URETEROSCOPY AND STENT PLACEMENT Bilateral 08/27/2021   Procedure: BILATERAL RETROGRADE PYELOGRAM, BILATERAL URETEROSCOPY;  Surgeon: MCleon Gustin MD;  [facility]: [facility];  Service: Urology;  Laterality: Bilateral;   CYSTOSCOPY WITH RETROGRADE PYELOGRAM, URETEROSCOPY AND STENT PLACEMENT Right 09/13/2021   Procedure: CYSTOSCOPY WITH RETROGRADE PYELOGRAM, URETEROSCOPY AND STENT REMOVAL;  Surgeon: MCleon Gustin MD;  [facility];  Service: Urology;  Laterality: Right;   CYSTOSCOPY WITH STENT PLACEMENT Right 08/27/2021   Procedure: CYSTOSCOPY WITH STENT PLACEMENT;  Surgeon: MCleon Gustin MD;  [facility]: [facility];  Service: Urology;  Laterality: Right;   INGUINAL HERNIA REPAIR Left 02/26/2006   recurrent   INGUINAL HERNIA REPAIR Left    prior to 2007   KNEE ARTHROSCOPY WITH MEDIAL MENISECTOMY Left 12/04/2017   Procedure: KNEE ARTHROSCOPY WITH MEDIAL MENISCECTOMY AND CHONDROPLASTY;  Surgeon:  VHiram Gash MD;  [place] [facility]  Service: Orthopedics;  Laterality: Left;  NO BLOCK   POLYPECTOMY  03/05/2019   Procedure: POLYPECTOMY;  Surgeon: RDaneil Dolin MD;  [facility];  Service: Endoscopy;;   STONE EXTRACTION WITH BASKET Bilateral 08/27/2021   Procedure: STONE EXTRACTION WITH BASKET;  Surgeon: MCleon Gustin MD;  [facility];  Service: Urology;  Laterality: Bilateral;   TOTAL ABDOMINAL HYSTERECTOMY W/ BILATERAL SALPINGOOPHORECTOMY  10/15/2006   TUBAL LIGATION      Home Medications:  Allergies as of 10/31/2021       Reactions   Codeine Itching   Can take vicodin   Propoxyphene N-acetaminophen Nausea Only   Darvocet        Medication List        Accurate as of Oct 31, 2021  9:29 AM. If you have any questions, ask your nurse or doctor.          acetaminophen 500 MG tablet Commonly known as: TYLENOL Take 1,000 mg by mouth every 6 (six) hours as needed for mild pain.   Centrum Silver 50+Women Tabs Take 1 ddaily What changed:  how much to take how to take this when to take this additional instructions   ondansetron 4 MG tablet Commonly known as: Zofran Take 1 tablet (4 mg total) by mouth daily as needed for nausea or vomiting.   oxyCODONE-acetaminophen 5-325 MG tablet Commonly known as: Percocet Take 1 tablet by mouth every 4 (four) hours as needed.   pantoprazole 20 MG tablet Commonly known as: Protonix Take 1 tablet (20  mg total) by mouth daily.   sertraline 50 MG tablet Commonly known as: ZOLOFT TAKE ONE (1) TABLET BY MOUTH EVERY DAY   tamsulosin 0.4 MG Caps capsule Commonly known as: Flomax Take 1 capsule (0.4 mg total) by mouth daily after supper.        Allergies:  Allergies  Allergen Reactions   Codeine Itching    Can take vicodin   Propoxyphene N-Acetaminophen Nausea Only    Darvocet    Family History: Family History  Problem Relation Age of Onset   Hypertension Brother     Kidney failure Brother        had kidney transplant   Hyperlipidemia Brother    Hypertension Sister    Hyperlipidemia Sister    Cancer Sister        cervical   Hypertension Brother    Kidney failure Father    Cancer Mother    Colon cancer Neg Hx     Social History:  reports that she has never smoked. She has never used smokeless tobacco. She reports that she does not drink alcohol and does not use drugs.  ROS: All other review of systems were reviewed and are negative except what is noted above in HPI  Physical Exam: BP (!) 147/75   Pulse (!) 55   Constitutional:  Alert and oriented, No acute distress. HEENT: [place] AT, moist mucus membranes.  Trachea midline, no masses. Cardiovascular: No clubbing, cyanosis, or edema. Respiratory: Normal respiratory effort, no increased work of breathing. GI: Abdomen is soft, nontender, nondistended, no abdominal masses GU: No CVA tenderness.  Lymph: No cervical or inguinal lymphadenopathy. Skin: No rashes, bruises or suspicious lesions. Neurologic: Grossly intact, no focal deficits, moving all 4 extremities. Psychiatric: Normal mood and affect.  Laboratory Data: Lab Results  Component Value Date   WBC 5.6 06/12/2021   HGB 13.4 06/12/2021   HCT 38.7 06/12/2021   MCV 83 06/12/2021   PLT 213 06/12/2021    Lab Results  Component Value Date   CREATININE 0.78 06/12/2021    No results found for: PSA  No results found for: TESTOSTERONE  No results found for: HGBA1C  Urinalysis    Component Value Date/Time   COLORURINE AMBER (A) 05/09/2018 0949   APPEARANCEUR Hazy (A) 09/24/2021 1342   LABSPEC 1.014 05/09/2018 0949   PHURINE 5.0 05/09/2018 0949   GLUCOSEU Negative 09/24/2021 1342   HGBUR SMALL (A) 05/09/2018 0949   BILIRUBINUR Negative 09/24/2021 1342   KETONESUR negative 01/29/2020 1414   KETONESUR NEGATIVE 05/09/2018 0949   PROTEINUR Negative 09/24/2021 1342   PROTEINUR NEGATIVE 05/09/2018 0949   UROBILINOGEN 0.2 01/29/2020  1414   UROBILINOGEN 0.2 01/29/2015 2352   NITRITE Negative 09/24/2021 1342   NITRITE POSITIVE (A) 05/09/2018 0949   LEUKOCYTESUR Trace (A) 09/24/2021 1342    Lab Results  Component Value Date   LABMICR See below: 09/24/2021   WBCUA 6-10 (A) 09/24/2021   RBCUA 0-2 12/17/2016   LABEPIT 0-10 09/24/2021   MUCUS Present 05/31/2019   BACTERIA Few (A) 09/24/2021    Pertinent Imaging: Renal US 10/24/2021: Images reviewed and discussed with the patient No results found for this or any previous visit.  No results found for this or any previous visit.  No results found for this or any previous visit.  No results found for this or any previous visit.  Results for orders placed during the hospital encounter of 10/24/21  US RENAL  Narrative CLINICAL DATA:  Follow-up kidney stones.  EXAM: RENAL /  URINARY TRACT ULTRASOUND COMPLETE  COMPARISON:  CT, 08/06/2021.  FINDINGS: Right Kidney:  Renal measurements: 8.8 x 4.1 x 4.9 cm = volume: 93.6 mL. Normal parenchymal echogenicity. No mass. Echogenic shadowing stone in the mid to lower pole, 5 mm. No hydronephrosis.  Left Kidney:  Renal measurements: 9.2 x 5.6 x 5.4 cm = volume: 146.3 mL. Normal parenchymal echogenicity. No mass, defined stone or hydronephrosis.  Bladder:  Appears normal for degree of bladder distention. Bilateral ureteral jets visualized.  Other:  None.  IMPRESSION: 1. No acute findings.  No hydronephrosis. 2. Single intrarenal stone noted on the right. Other small intrarenal stones noted on the prior CT are not defined sonographically. No renal masses.   Electronically Signed By: Lajean Manes M.D. On: 10/25/2021 09:37  No results found for this or any previous visit.  No results found for this or any previous visit.  Results for orders placed in visit on 08/06/21  CT RENAL STONE STUDY  Narrative CLINICAL DATA:  Acute left flank pain.  EXAM: CT ABDOMEN AND PELVIS WITHOUT  CONTRAST  TECHNIQUE: Multidetector CT imaging of the abdomen and pelvis was performed following the standard protocol without IV contrast.  RADIATION DOSE REDUCTION: This exam was performed according to the departmental dose-optimization program which includes automated exposure control, adjustment of the mA and/or kV according to patient size and/or use of iterative reconstruction technique.  COMPARISON:  February 09, 2010.  FINDINGS: Lower chest: No acute abnormality.  Hepatobiliary: No focal liver abnormality is seen. No gallstones, gallbladder wall thickening, or biliary dilatation.  Pancreas: Unremarkable. No pancreatic ductal dilatation or surrounding inflammatory changes.  Spleen: Normal in size without focal abnormality.  Adrenals/Urinary Tract: Adrenal glands appear normal. Bilateral nephrolithiasis is noted. No hydronephrosis or renal obstruction is noted. Urinary bladder is unremarkable.  Stomach/Bowel: Stomach is within normal limits. Appendix appears normal. No evidence of bowel wall thickening, distention, or inflammatory changes.  Vascular/Lymphatic: Aortic atherosclerosis. No enlarged abdominal or pelvic lymph nodes.  Reproductive: Status post hysterectomy. No adnexal masses.  Other: Small fat containing right inguinal hernia is noted. No ascites is noted.  Musculoskeletal: No acute or significant osseous findings.  IMPRESSION: Bilateral nonobstructive nephrolithiasis. No hydronephrosis or renal obstruction is noted.  Small fat containing right inguinal hernia.  Aortic Atherosclerosis (ICD10-I70.0).   Electronically Signed By: Marijo Conception M.D. On: 08/06/2021 12:21   Assessment & Plan:    1. History of kidney stones -RCT 6 months with renal US - Urinalysis, Routine w reflex microscopic   No follow-ups on file.  [name], MD  [facility] [place]

## 2021-10-31 NOTE — Patient Instructions (Signed)
Dietary Guidelines to Help Prevent Kidney Stones Kidney stones are deposits of minerals and salts that form inside your kidneys. Your risk of developing kidney stones may be greater depending on your diet, your lifestyle, the medicines you take, and whether you have certain medical conditions. Most people can lower their chances of developing kidney stones by following the instructions below. Your dietitian may give you more specific instructions depending on your overall health and the type of kidney stones you tend to develop. What are tips for following this plan? Reading food labels  Choose foods with "no salt added" or "low-salt" labels. Limit your salt (sodium) intake to less than 1,500 mg a day. Choose foods with calcium for each meal and snack. Try to eat about 300 mg of calcium at each meal. Foods that contain 200-500 mg of calcium a serving include: 8 oz (237 mL) of milk, calcium-fortifiednon-dairy milk, and calcium-fortifiedfruit juice. Calcium-fortified means that calcium has been added to these drinks. 8 oz (237 mL) of kefir, yogurt, and soy yogurt. 4 oz (114 g) of tofu. 1 oz (28 g) of cheese. 1 cup (150 g) of dried figs. 1 cup (91 g) of cooked broccoli. One 3 oz (85 g) can of sardines or mackerel. Most people need 1,000-1,500 mg of calcium a day. Talk to your dietitian about how much calcium is recommended for you. Shopping Buy plenty of fresh fruits and vegetables. Most people do not need to avoid fruits and vegetables, even if these foods contain nutrients that may contribute to kidney stones. When shopping for convenience foods, choose: Whole pieces of fruit. Pre-made salads with dressing on the side. Low-fat fruit and yogurt smoothies. Avoid buying frozen meals or prepared deli foods. These can be high in sodium. Look for foods with live cultures, such as yogurt and kefir. Choose high-fiber grains, such as whole-wheat breads, oat bran, and wheat cereals. Cooking Do not add  salt to food when cooking. Place a salt shaker on the table and allow each person to add his or her own salt to taste. Use vegetable protein, such as beans, textured vegetable protein (TVP), or tofu, instead of meat in pasta, casseroles, and soups. Meal planning Eat less salt, if told by your dietitian. To do this: Avoid eating processed or pre-made food. Avoid eating fast food. Eat less animal protein, including cheese, meat, poultry, or fish, if told by your dietitian. To do this: Limit the number of times you have meat, poultry, fish, or cheese each week. Eat a diet free of meat at least 2 days a week. Eat only one serving each day of meat, poultry, fish, or seafood. When you prepare animal protein, cut pieces into small portion sizes. For most meat and fish, one serving is about the size of the palm of your hand. Eat at least five servings of fresh fruits and vegetables each day. To do this: Keep fruits and vegetables on hand for snacks. Eat one piece of fruit or a handful of berries with breakfast. Have a salad and fruit at lunch. Have two kinds of vegetables at dinner. Limit foods that are high in a substance called oxalate. These include: Spinach (cooked), rhubarb, beets, sweet potatoes, and Swiss chard. Peanuts. Potato chips, french fries, and baked potatoes with skin on. Nuts and nut products. Chocolate. If you regularly take a diuretic medicine, make sure to eat at least 1 or 2 servings of fruits or vegetables that are high in potassium each day. These include: Avocado. Banana. Orange, prune,   carrot, or tomato juice. Baked potato. Cabbage. Beans and split peas. Lifestyle  Drink enough fluid to keep your urine pale yellow. This is the most important thing you can do. Spread your fluid intake throughout the day. If you drink alcohol: Limit how much you use to: 0-1 drink a day for women who are not pregnant. 0-2 drinks a day for men. Be aware of how much alcohol is in your  drink. In the [place] one drink equals one 12 oz bottle of beer (355 mL), one 5 oz glass of wine (148 mL), or one 1 oz glass of hard liquor (44 mL). Lose weight if told by your health care provider. Work with your dietitian to find an eating plan and weight loss strategies that work best for you. General information Talk to your health care provider and dietitian about taking daily supplements. You may be told the following depending on your health and the cause of your kidney stones: Not to take supplements with vitamin C. To take a calcium supplement. To take a daily probiotic supplement. To take other supplements such as magnesium, fish oil, or vitamin B6. Take over-the-counter and prescription medicines only as told by your health care provider. These include supplements. What foods should I limit? Limit your intake of the following foods, or eat them as told by your dietitian. Vegetables Spinach. Rhubarb. Beets. Canned vegetables. Pickles. Olives. Baked potatoes with skin. Grains Wheat bran. Baked goods. Salted crackers. Cereals high in sugar. Meats and other proteins Nuts. Nut butters. Large portions of meat, poultry, or fish. Salted, precooked, or cured meats, such as sausages, meat loaves, and hot dogs. Dairy Cheese. Beverages Regular soft drinks. Regular vegetable juice. Seasonings and condiments Seasoning blends with salt. Salad dressings. Soy sauce. Ketchup. Barbecue sauce. Other foods Canned soups. Canned pasta sauce. Casseroles. Pizza. Lasagna. Frozen meals. Potato chips. French fries. The items listed above may not be a complete list of foods and beverages you should limit. Contact a dietitian for more information. What foods should I avoid? Talk to your dietitian about specific foods you should avoid based on the type of kidney stones you have and your overall health. Fruits Grapefruit. The item listed above may not be a complete list of foods and beverages you should  avoid. Contact a dietitian for more information. Summary Kidney stones are deposits of minerals and salts that form inside your kidneys. You can lower your risk of kidney stones by making changes to your diet. The most important thing you can do is drink enough fluid. Drink enough fluid to keep your urine pale yellow. Talk to your dietitian about how much calcium you should have each day, and eat less salt and animal protein as told by your dietitian. This information is not intended to replace advice given to you by your health care provider. Make sure you discuss any questions you have with your health care provider. Document Revised: 02/05/2021 Document Reviewed: 02/05/2021 Elsevier Patient Education  2023 [employer]  

## 2021-11-01 LAB — URINALYSIS, ROUTINE W REFLEX MICROSCOPIC
Bilirubin, UA: NEGATIVE
Glucose, UA: NEGATIVE
Ketones, UA: NEGATIVE
Nitrite, UA: NEGATIVE
Protein,UA: NEGATIVE
Specific Gravity, UA: 1.015 (ref 1.005–1.030)
Urobilinogen, Ur: 0.2 mg/dL (ref 0.2–1.0)
pH, UA: 6 (ref 5.0–7.5)

## 2021-11-01 LAB — MICROSCOPIC EXAMINATION: Renal Epithel, UA: NONE SEEN /hpf

## 2021-12-26 ENCOUNTER — Other Ambulatory Visit: Payer: Self-pay | Admitting: Urology

## 2022-01-17 ENCOUNTER — Ambulatory Visit
Admission: EM | Admit: 2022-01-17 | Discharge: 2022-01-17 | Disposition: A | Discharge status: Home / Self Care | Payer: BC Managed Care – PPO | Attending: Urgent Care | Admitting: Urgent Care

## 2022-01-17 DIAGNOSIS — M545 Low back pain, unspecified: Secondary | ICD-10-CM | POA: Diagnosis not present

## 2022-01-17 DIAGNOSIS — Z87442 Personal history of urinary calculi: Secondary | ICD-10-CM | POA: Diagnosis not present

## 2022-01-17 DIAGNOSIS — R300 Dysuria: Secondary | ICD-10-CM | POA: Diagnosis not present

## 2022-01-17 DIAGNOSIS — R350 Frequency of micturition: Secondary | ICD-10-CM | POA: Insufficient documentation

## 2022-01-17 LAB — POCT URINALYSIS DIP (MANUAL ENTRY)
Bilirubin, UA: NEGATIVE
Glucose, UA: NEGATIVE mg/dL
Ketones, POC UA: NEGATIVE mg/dL
Nitrite, UA: NEGATIVE
Protein Ur, POC: NEGATIVE mg/dL
Spec Grav, UA: >=1.03 — AB (ref 1.010–1.025)
Urobilinogen, UA: 0.2 E.U./dL
pH, UA: 5 (ref 5.0–8.0)

## 2022-01-17 MED ORDER — ACETAMINOPHEN 325 MG PO TABS: 650.0000 mg | ORAL_TABLET | Freq: Four times a day (QID) | ORAL | 0 refills | Status: DC | PRN

## 2022-01-17 MED ORDER — METHOCARBAMOL 500 MG PO TABS: 500.0000 mg | ORAL_TABLET | Freq: Two times a day (BID) | ORAL | 0 refills | Status: DC

## 2022-01-17 NOTE — Discharge Instructions (Addendum)
Make sure you hydrate very well with plain water and a quantity of 80 ounces of water a day.  Please limit drinks that are considered urinary irritants such as soda, sweet tea, coffee, energy drinks, alcohol.  These can worsen your urinary and genital symptoms but also be the source of them.  I will let you know about your urine culture results through MyChart to see if we need to prescribe or change your antibiotics based off of those results.

## 2022-01-17 NOTE — ED Provider Notes (Signed)
[place]   MRN: [id number] DOB: 13-Oct-1955  Subjective:   Shelby Guerra is a 66 y.o. female presenting for 2 day history of recurrent left sided low back pain that radiates along her left flank side.  Has a history of renal stones and had procedural removal of these earlier this year.  She does see a urologist.  Denies fever, nausea, vomiting, dysuria, hematuria.  No urinary frequency.  However she has had the symptoms very consistently in the past including dysuria, urinary frequency.  Admits that she does not drink as much water.  She drinks a lot of sodas. No fall, trauma, numbness or tingling, saddle paresthesia, changes to bowel or urinary habits, radicular symptoms.   No current facility-administered medications for this encounter.  Current Outpatient Medications:    acetaminophen (TYLENOL) 500 MG tablet, Take 1,000 mg by mouth every 6 (six) hours as needed for mild pain., Disp: , Rfl:    Multiple Vitamins-Minerals (CENTRUM SILVER 50+WOMEN) TABS, Take 1 ddaily (Patient taking differently: Take 1 tablet by mouth daily.), Disp: , Rfl:    ondansetron (ZOFRAN) 4 MG tablet, Take 1 tablet (4 mg total) by mouth daily as needed for nausea or vomiting., Disp: 30 tablet, Rfl: 1   oxyCODONE-acetaminophen (PERCOCET) 5-325 MG tablet, Take 1 tablet by mouth every 4 (four) hours as needed., Disp: 15 tablet, Rfl: 0   pantoprazole (PROTONIX) 20 MG tablet, Take 1 tablet (20 mg total) by mouth daily., Disp: 30 tablet, Rfl: 6   sertraline (ZOLOFT) 50 MG tablet, TAKE ONE (1) TABLET BY MOUTH EVERY DAY, Disp: 90 tablet, Rfl: 0   tamsulosin (FLOMAX) 0.4 MG CAPS capsule, Take 1 capsule (0.4 mg total) by mouth daily after supper., Disp: 30 capsule, Rfl: 0   Allergies  Allergen Reactions   Codeine Itching    Can take vicodin   Propoxyphene N-Acetaminophen Nausea Only    Darvocet    Past Medical History:  Diagnosis Date   GERD (gastroesophageal reflux disease)    High cholesterol     History of kidney stones    Hot flashes 03/10/2013   Hypertension    pt denies   Impaired fasting glucose    Moody 03/14/2014     Past Surgical History:  Procedure Laterality Date   COLONOSCOPY  12/13/2008   COLONOSCOPY N/A 03/05/2019   Procedure: COLONOSCOPY;  Surgeon: Daneil Dolin, MD;  [facility];  Service: Endoscopy;  Laterality: N/A;  10:30   CYSTOSCOPY WITH RETROGRADE PYELOGRAM, URETEROSCOPY AND STENT PLACEMENT Bilateral 08/27/2021   Procedure: BILATERAL RETROGRADE PYELOGRAM, BILATERAL URETEROSCOPY;  Surgeon: Cleon Gustin, MD;  [facility];  Service: Urology;  Laterality: Bilateral;   CYSTOSCOPY WITH RETROGRADE PYELOGRAM, URETEROSCOPY AND STENT PLACEMENT Right 09/13/2021   Procedure: CYSTOSCOPY WITH RETROGRADE PYELOGRAM, URETEROSCOPY AND STENT REMOVAL;  Surgeon: Cleon Gustin, MD;  [facility];  Service: Urology;  Laterality: Right;   CYSTOSCOPY WITH STENT PLACEMENT Right 08/27/2021   Procedure: CYSTOSCOPY WITH STENT PLACEMENT;  Surgeon: Cleon Gustin, MD;  [facility];  Service: Urology;  Laterality: Right;   INGUINAL HERNIA REPAIR Left 02/26/2006   recurrent   INGUINAL HERNIA REPAIR Left    prior to 2007   KNEE ARTHROSCOPY WITH MEDIAL MENISECTOMY Left 12/04/2017   Procedure: KNEE ARTHROSCOPY WITH MEDIAL MENISCECTOMY AND CHONDROPLASTY;  Surgeon: Hiram Gash, MD;  Location: [place];  Service: Orthopedics;  Laterality: Left;  NO BLOCK   POLYPECTOMY  03/05/2019   Procedure: POLYPECTOMY;  Surgeon: Daneil Dolin, MD;  [facility];  Service: Endoscopy;;   STONE EXTRACTION WITH BASKET Bilateral 08/27/2021   Procedure: STONE EXTRACTION WITH BASKET;  Surgeon: Cleon Gustin, MD;  [facility];  Service: Urology;  Laterality: Bilateral;   TOTAL ABDOMINAL HYSTERECTOMY W/ BILATERAL SALPINGOOPHORECTOMY  10/15/2006   TUBAL LIGATION      Family History  Problem Relation Age of Onset   Hypertension  Brother    Kidney failure Brother        had kidney transplant   Hyperlipidemia Brother    Hypertension Sister    Hyperlipidemia Sister    Cancer Sister        cervical   Hypertension Brother    Kidney failure Father    Cancer Mother    Colon cancer Neg Hx     Social History   Tobacco Use   Smoking status: Never   Smokeless tobacco: Never  Vaping Use   Vaping Use: Never used  Substance Use Topics   Alcohol use: No   Drug use: No    ROS   Objective:   Vitals: BP (!) 154/89 (BP Location: Right Arm)   Pulse (!) 55   Temp 97.9 F (36.6 C) (Oral)   Resp 18   SpO2 99%   Physical Exam Constitutional:      General: She is not in acute distress.    Appearance: Normal appearance. She is well-developed. She is not ill-appearing, toxic-appearing or diaphoretic.  HENT:     Head: Normocephalic and atraumatic.     Nose: Nose normal.     Mouth/Throat:     Mouth: Mucous membranes are moist.     Pharynx: Oropharynx is clear.  Eyes:     General: No scleral icterus.       Right eye: No discharge.        Left eye: No discharge.     Extraocular Movements: Extraocular movements intact.     Conjunctiva/sclera: Conjunctivae normal.  Cardiovascular:     Rate and Rhythm: Normal rate.  Pulmonary:     Effort: Pulmonary effort is normal.  Abdominal:     General: Bowel sounds are normal. There is no distension.     Palpations: Abdomen is soft. There is no mass.     Tenderness: There is no abdominal tenderness. There is no right CVA tenderness, left CVA tenderness, guarding or rebound.  Musculoskeletal:     Lumbar back: No swelling, edema, deformity, signs of trauma, lacerations, spasms, tenderness or bony tenderness. Normal range of motion. Negative right straight leg raise test and negative left straight leg raise test. No scoliosis.  Skin:    General: Skin is warm and dry.  Neurological:     General: No focal deficit present.     Mental Status: She is alert and oriented to  person, place, and time.  Psychiatric:        Mood and Affect: Mood normal.        Behavior: Behavior normal.        Thought Content: Thought content normal.        Judgment: Judgment normal.     Results for orders placed or performed during the hospital encounter of 01/17/22 (from the past 24 hour(s))  POCT urinalysis dipstick     Status: Abnormal   Collection Time: 01/17/22  9:31 AM  Result Value Ref Range   Color, UA yellow yellow   Clarity, UA cloudy (A) clear   Glucose, UA negative negative  mg/dL   Bilirubin, UA negative negative   Ketones, POC UA negative negative mg/dL   Spec Grav, UA >=1.030 (A) 1.010 - 1.025   Blood, UA trace-intact (A) negative   pH, UA 5.0 5.0 - 8.0   Protein Ur, POC negative negative mg/dL   Urobilinogen, UA 0.2 0.2 or 1.0 E.U./dL   Nitrite, UA Negative Negative   Leukocytes, UA Small (1+) (A) Negative    Assessment and Plan :   PDMP not reviewed this encounter.  1. Acute left-sided low back pain without sciatica   2. Dysuria   3. Urinary frequency   4. History of renal stone    Low suspicion for recurrent urinary tract infection.  Advised that she start hydrating much better.  Urine culture pending, will treat as appropriate.  Avoid urinary irritants especially sodas.  Use Tylenol and Robaxin for her back pain which I suspect is musculoskeletal, likely worsened by dehydration. Counseled patient on potential for adverse effects with medications prescribed/recommended today, ER and return-to-clinic precautions discussed, patient verbalized understanding.    Jaynee Eagles, PA-C 01/17/22 1001

## 2022-01-17 NOTE — ED Triage Notes (Signed)
Pt states left flank pain for the past 2 days. Denies any blood in urine or burning with urination.

## 2022-01-19 LAB — URINE CULTURE

## 2022-02-12 ENCOUNTER — Other Ambulatory Visit (HOSPITAL_COMMUNITY): Payer: Self-pay | Admitting: Adult Health

## 2022-02-12 DIAGNOSIS — Z1231 Encounter for screening mammogram for malignant neoplasm of breast: Secondary | ICD-10-CM

## 2022-02-14 ENCOUNTER — Other Ambulatory Visit: Payer: Self-pay | Admitting: Physician Assistant

## 2022-02-14 ENCOUNTER — Other Ambulatory Visit (HOSPITAL_COMMUNITY): Payer: Self-pay | Admitting: Physician Assistant

## 2022-02-14 DIAGNOSIS — R109 Unspecified abdominal pain: Secondary | ICD-10-CM

## 2022-02-14 DIAGNOSIS — R8290 Unspecified abnormal findings in urine: Secondary | ICD-10-CM

## 2022-02-14 DIAGNOSIS — Z87442 Personal history of urinary calculi: Secondary | ICD-10-CM

## 2022-02-15 ENCOUNTER — Ambulatory Visit (HOSPITAL_COMMUNITY)
Admission: RE | Admit: 2022-02-15 | Discharge: 2022-02-15 | Disposition: A | Discharge status: Home / Self Care | Payer: BC Managed Care – PPO | Source: Ambulatory Visit | Attending: Physician Assistant | Admitting: Physician Assistant

## 2022-02-15 DIAGNOSIS — R109 Unspecified abdominal pain: Secondary | ICD-10-CM | POA: Insufficient documentation

## 2022-02-15 DIAGNOSIS — Z87442 Personal history of urinary calculi: Secondary | ICD-10-CM

## 2022-02-15 DIAGNOSIS — R10A2 Flank pain, left side: Secondary | ICD-10-CM

## 2022-02-15 DIAGNOSIS — R8290 Unspecified abnormal findings in urine: Secondary | ICD-10-CM

## 2022-02-18 ENCOUNTER — Ambulatory Visit (HOSPITAL_COMMUNITY)
Admission: RE | Admit: 2022-02-18 | Discharge: 2022-02-18 | Disposition: A | Discharge status: Home / Self Care | Payer: BC Managed Care – PPO | Source: Ambulatory Visit | Attending: Adult Health | Admitting: Adult Health

## 2022-02-18 DIAGNOSIS — Z1231 Encounter for screening mammogram for malignant neoplasm of breast: Secondary | ICD-10-CM | POA: Diagnosis present

## 2022-03-11 ENCOUNTER — Ambulatory Visit (INDEPENDENT_AMBULATORY_CARE_PROVIDER_SITE_OTHER): Payer: BC Managed Care – PPO | Admitting: Physician Assistant

## 2022-03-11 VITALS — BP 154/79 | HR 69

## 2022-03-11 DIAGNOSIS — Z87442 Personal history of urinary calculi: Secondary | ICD-10-CM

## 2022-03-11 DIAGNOSIS — M545 Low back pain, unspecified: Secondary | ICD-10-CM

## 2022-03-11 DIAGNOSIS — M461 Sacroiliitis, not elsewhere classified: Secondary | ICD-10-CM | POA: Diagnosis not present

## 2022-03-11 MED ORDER — ETODOLAC 400 MG PO TABS: 400.0000 mg | ORAL_TABLET | Freq: Two times a day (BID) | ORAL | 0 refills | Status: DC

## 2022-03-11 NOTE — Patient Instructions (Signed)
Follow up with your primary care provider.

## 2022-03-11 NOTE — Progress Notes (Signed)
Assessment: 1. History of kidney stones - Urinalysis, Routine w reflex microscopic    Plan: Pt reassured no stone noted in ureter and no obstruction noted on imaging. Pain unlikely coming from urinary stone dz and UA clear today. Discussed stretches and Rx for Lodine given. GI precautions discussed.  Written patient information given on SI joint and low back pain.  Pt advised to FU with PCP for further evaluation or ED if sxs worsen.  Meds ordered this encounter  Medications   etodolac (LODINE) 400 MG tablet    Sig: Take 1 tablet (400 mg total) by mouth 2 (two) times daily.    Dispense:  40 tablet    Refill:  0     Chief Complaint: No chief complaint on file.   HPI: Shelby Guerra is a 66 y.o. female with h/o stone dz who presents for evaluation of 1 month h/o left sided mid/low back pain with SI joint pain. Pain is "nagging". Does not radiate. Motion worsens. No LUTs, gross hematuria, fever, chills. Pt denies numbness/tingling. No weakness. Pt saw UC for same and Rx for Robaxin with otc ibuprofen helped very little. Pt denies injury, but did do a lot of lifting and twisting at work just before onset. CTAP on 02/15/22-B tiny stones in both kidneys. No ureteral stones or sign of obstruction. Images reviewed during OV  UA=clear   10/31/21 Shelby Guerra is a 66yo here for followup for nephrolithiasis. No stone events since last visit. Renal US 5/17 shows no hydronephrosis and a 38m right renal nonshadowing calcification. NO significant LUTS. No flank pain. She is increased her water intake to 40oz NO other complaints today.  Portions of the above documentation were copied from a prior visit for review purposes only.  Allergies: Allergies  Allergen Reactions   Codeine Itching    Can take vicodin   Propoxyphene N-Acetaminophen Nausea Only    Darvocet    PMH: Past Medical History:  Diagnosis Date   GERD (gastroesophageal reflux disease)    High cholesterol    History of kidney  stones    Hot flashes 03/10/2013   Hypertension    pt denies   Impaired fasting glucose    Moody 03/14/2014    PSH: Past Surgical History:  Procedure Laterality Date   COLONOSCOPY  12/13/2008   COLONOSCOPY N/A 03/05/2019   Procedure: COLONOSCOPY;  Surgeon: RDaneil Dolin MD;  [facility];  Service: Endoscopy;  Laterality: N/A;  10:30   CYSTOSCOPY WITH RETROGRADE PYELOGRAM, URETEROSCOPY AND STENT PLACEMENT Bilateral 08/27/2021   Procedure: BILATERAL RETROGRADE PYELOGRAM, BILATERAL URETEROSCOPY;  Surgeon: MCleon Gustin MD;  [facility]: [facility];  Service: Urology;  Laterality: Bilateral;   CYSTOSCOPY WITH RETROGRADE PYELOGRAM, URETEROSCOPY AND STENT PLACEMENT Right 09/13/2021   Procedure: CYSTOSCOPY WITH RETROGRADE PYELOGRAM, URETEROSCOPY AND STENT REMOVAL;  Surgeon: MCleon Gustin MD;  [facility]: [facility];  Service: Urology;  Laterality: Right;   CYSTOSCOPY WITH STENT PLACEMENT Right 08/27/2021   Procedure: CYSTOSCOPY WITH STENT PLACEMENT;  Surgeon: MCleon Gustin MD;  [facility]: [facility];  Service: Urology;  Laterality: Right;   INGUINAL HERNIA REPAIR Left 02/26/2006   recurrent   INGUINAL HERNIA REPAIR Left    prior to 2007   KNEE ARTHROSCOPY WITH MEDIAL MENISECTOMY Left 12/04/2017   Procedure: KNEE ARTHROSCOPY WITH MEDIAL MENISCECTOMY AND CHONDROPLASTY;  Surgeon: VHiram Gash MD;  Location[place] [facility]  Service: Orthopedics;  Laterality: Left;  NO BLOCK   POLYPECTOMY  03/05/2019  Procedure: POLYPECTOMY;  Surgeon: Daneil Dolin, MD;  [facility];  Service: Endoscopy;;   STONE EXTRACTION WITH BASKET Bilateral 08/27/2021   Procedure: STONE EXTRACTION WITH BASKET;  Surgeon: Cleon Gustin, MD;  [facility]: [facility];  Service: Urology;  Laterality: Bilateral;   TOTAL ABDOMINAL HYSTERECTOMY W/ BILATERAL SALPINGOOPHORECTOMY  10/15/2006   TUBAL LIGATION      SH: Social History   Tobacco Use   Smoking status: Never   Smokeless tobacco:  Never  Vaping Use   Vaping Use: Never used  Substance Use Topics   Alcohol use: No   Drug use: No    ROS: All other review of systems were reviewed and are negative except what is noted above in HPI  PE: BP (!) 154/79   Pulse 69  GENERAL APPEARANCE:  Well appearing, well developed, well nourished, NAD HEENT:  Atraumatic, normocephalic NECK:  Supple. Trachea midline ABDOMEN:  Soft, non-tender, no masses EXTREMITIES:  Moves all extremities well, without clubbing, cyanosis, or edema NEUROLOGIC:  Alert and oriented x 3, normal gait, CN II-XII grossly intact MENTAL STATUS:  appropriate BACK:  Tender paraspinous soft tissues on the left with spasm noted. Left SI joint moderately tender to palpation, No CVAT SKIN:  Warm, dry, and intact   Results: Laboratory Data: Lab Results  Component Value Date   WBC 5.6 06/12/2021   HGB 13.4 06/12/2021   HCT 38.7 06/12/2021   MCV 83 06/12/2021   PLT 213 06/12/2021    Lab Results  Component Value Date   CREATININE 0.78 06/12/2021     Urinalysis    Component Value Date/Time   COLORURINE AMBER (A) 05/09/2018 0949   APPEARANCEUR Hazy (A) 11/01/2021 0836   LABSPEC 1.014 05/09/2018 0949   PHURINE 5.0 05/09/2018 0949   GLUCOSEU Negative 11/01/2021 0836   HGBUR SMALL (A) 05/09/2018 0949   BILIRUBINUR negative 01/17/2022 0931   BILIRUBINUR Negative 11/01/2021 0836   KETONESUR negative 01/17/2022 0931   KETONESUR NEGATIVE 05/09/2018 0949   PROTEINUR negative 01/17/2022 0931   PROTEINUR Negative 11/01/2021 0836   PROTEINUR NEGATIVE 05/09/2018 0949   UROBILINOGEN 0.2 01/17/2022 0931   UROBILINOGEN 0.2 01/29/2015 2352   NITRITE Negative 01/17/2022 0931   NITRITE Negative 11/01/2021 0836   NITRITE POSITIVE (A) 05/09/2018 0949   LEUKOCYTESUR Small (1+) (A) 01/17/2022 0931   LEUKOCYTESUR 1+ (A) 11/01/2021 0836    Lab Results  Component Value Date   LABMICR See below: 11/01/2021   WBCUA 6-10 (A) 11/01/2021   RBCUA 0-2 12/17/2016    LABEPIT 0-10 11/01/2021   MUCUS Present 05/31/2019   BACTERIA Few 11/01/2021    Pertinent Imaging: No results found for this or any previous visit.  No results found for this or any previous visit.  No results found for this or any previous visit.  No results found for this or any previous visit.  Results for orders placed during the hospital encounter of 10/24/21  US RENAL  Narrative CLINICAL DATA:  Follow-up kidney stones.  EXAM: RENAL / URINARY TRACT ULTRASOUND COMPLETE  COMPARISON:  CT, 08/06/2021.  FINDINGS: Right Kidney:  Renal measurements: 8.8 x 4.1 x 4.9 cm = volume: 93.6 mL. Normal parenchymal echogenicity. No mass. Echogenic shadowing stone in the mid to lower pole, 5 mm. No hydronephrosis.  Left Kidney:  Renal measurements: 9.2 x 5.6 x 5.4 cm = volume: 146.3 mL. Normal parenchymal echogenicity. No mass, defined stone or hydronephrosis.  Bladder:  Appears normal for degree of bladder distention. Bilateral ureteral jets  visualized.  Other:  None.  IMPRESSION: 1. No acute findings.  No hydronephrosis. 2. Single intrarenal stone noted on the right. Other small intrarenal stones noted on the prior CT are not defined sonographically. No renal masses.   Electronically Signed By: Lajean Manes M.D. On: 10/25/2021 09:37  No valid procedures specified. No results found for this or any previous visit.  Results for orders placed in visit on 08/06/21  CT RENAL STONE STUDY  Narrative CLINICAL DATA:  Acute left flank pain.  EXAM: CT ABDOMEN AND PELVIS WITHOUT CONTRAST  TECHNIQUE: Multidetector CT imaging of the abdomen and pelvis was performed following the standard protocol without IV contrast.  RADIATION DOSE REDUCTION: This exam was performed according to the departmental dose-optimization program which includes automated exposure control, adjustment of the mA and/or kV according to patient size and/or use of iterative reconstruction  technique.  COMPARISON:  February 09, 2010.  FINDINGS: Lower chest: No acute abnormality.  Hepatobiliary: No focal liver abnormality is seen. No gallstones, gallbladder wall thickening, or biliary dilatation.  Pancreas: Unremarkable. No pancreatic ductal dilatation or surrounding inflammatory changes.  Spleen: Normal in size without focal abnormality.  Adrenals/Urinary Tract: Adrenal glands appear normal. Bilateral nephrolithiasis is noted. No hydronephrosis or renal obstruction is noted. Urinary bladder is unremarkable.  Stomach/Bowel: Stomach is within normal limits. Appendix appears normal. No evidence of bowel wall thickening, distention, or inflammatory changes.  Vascular/Lymphatic: Aortic atherosclerosis. No enlarged abdominal or pelvic lymph nodes.  Reproductive: Status post hysterectomy. No adnexal masses.  Other: Small fat containing right inguinal hernia is noted. No ascites is noted.  Musculoskeletal: No acute or significant osseous findings.  IMPRESSION: Bilateral nonobstructive nephrolithiasis. No hydronephrosis or renal obstruction is noted.  Small fat containing right inguinal hernia.  Aortic Atherosclerosis (ICD10-I70.0).   Electronically Signed By: Marijo Conception M.D. On: 08/06/2021 12:21  No results found for this or any previous visit (from the past 24 hour(s)).

## 2022-03-12 LAB — MICROSCOPIC EXAMINATION

## 2022-03-12 LAB — URINALYSIS, ROUTINE W REFLEX MICROSCOPIC
Bilirubin, UA: NEGATIVE
Glucose, UA: NEGATIVE
Ketones, UA: NEGATIVE
Nitrite, UA: NEGATIVE
Protein,UA: NEGATIVE
RBC, UA: NEGATIVE
Specific Gravity, UA: 1.025 (ref 1.005–1.030)
Urobilinogen, Ur: 0.2 mg/dL (ref 0.2–1.0)
pH, UA: 5 (ref 5.0–7.5)

## 2022-05-01 ENCOUNTER — Ambulatory Visit (HOSPITAL_COMMUNITY)
Admission: RE | Admit: 2022-05-01 | Discharge: 2022-05-01 | Disposition: A | Discharge status: Home / Self Care | Payer: BC Managed Care – PPO | Source: Ambulatory Visit | Attending: Urology | Admitting: Urology

## 2022-05-01 DIAGNOSIS — Z87442 Personal history of urinary calculi: Secondary | ICD-10-CM | POA: Insufficient documentation

## 2022-05-03 ENCOUNTER — Other Ambulatory Visit: Payer: Self-pay | Admitting: Adult Health

## 2022-05-08 ENCOUNTER — Ambulatory Visit (INDEPENDENT_AMBULATORY_CARE_PROVIDER_SITE_OTHER): Payer: BC Managed Care – PPO | Admitting: Urology

## 2022-05-08 VITALS — BP 143/81 | HR 62

## 2022-05-08 DIAGNOSIS — Z87442 Personal history of urinary calculi: Secondary | ICD-10-CM | POA: Diagnosis not present

## 2022-05-08 NOTE — Progress Notes (Signed)
05/08/2022 9:31 AM   Shelby Guerra 1955-12-10 [id number]  Referring provider: Denny Levy, [place],  [place] [id number]  nephrolithiasis   HPI: Shelby Guerra is a 66yo here for followup for nephrolithiasis. No stone events since last visit. She denies any flank pain. She drinks 60oz+ of water daily. Renal US 11/22 shows no calculi.   PMH: Past Medical History:  Diagnosis Date   GERD (gastroesophageal reflux disease)    High cholesterol    History of kidney stones    Hot flashes 03/10/2013   Hypertension    pt denies   Impaired fasting glucose    Moody 03/14/2014    Surgical History: Past Surgical History:  Procedure Laterality Date   COLONOSCOPY  12/13/2008   COLONOSCOPY N/A 03/05/2019   Procedure: COLONOSCOPY;  Surgeon: Daneil Dolin, MD;  [facility];  Service: Endoscopy;  Laterality: N/A;  10:30   CYSTOSCOPY WITH RETROGRADE PYELOGRAM, URETEROSCOPY AND STENT PLACEMENT Bilateral 08/27/2021   Procedure: BILATERAL RETROGRADE PYELOGRAM, BILATERAL URETEROSCOPY;  Surgeon: Cleon Gustin, MD;  [facility];  Service: Urology;  Laterality: Bilateral;   CYSTOSCOPY WITH RETROGRADE PYELOGRAM, URETEROSCOPY AND STENT PLACEMENT Right 09/13/2021   Procedure: CYSTOSCOPY WITH RETROGRADE PYELOGRAM, URETEROSCOPY AND STENT REMOVAL;  Surgeon: Cleon Gustin, MD;  [facility];  Service: Urology;  Laterality: Right;   CYSTOSCOPY WITH STENT PLACEMENT Right 08/27/2021   Procedure: CYSTOSCOPY WITH STENT PLACEMENT;  Surgeon: Cleon Gustin, MD;  [facility];  Service: Urology;  Laterality: Right;   INGUINAL HERNIA REPAIR Left 02/26/2006   recurrent   INGUINAL HERNIA REPAIR Left    prior to 2007   KNEE ARTHROSCOPY WITH MEDIAL MENISECTOMY Left 12/04/2017   Procedure: KNEE ARTHROSCOPY WITH MEDIAL MENISCECTOMY AND CHONDROPLASTY;  Surgeon: Hiram Gash, MD;  Location: [facility];  Service: Orthopedics;  Laterality: Left;  NO BLOCK    POLYPECTOMY  03/05/2019   Procedure: POLYPECTOMY;  Surgeon: Daneil Dolin, MD;  [facility];  Service: Endoscopy;;   STONE EXTRACTION WITH BASKET Bilateral 08/27/2021   Procedure: STONE EXTRACTION WITH BASKET;  Surgeon: Cleon Gustin, MD;  [facility];  Service: Urology;  Laterality: Bilateral;   TOTAL ABDOMINAL HYSTERECTOMY W/ BILATERAL SALPINGOOPHORECTOMY  10/15/2006   TUBAL LIGATION      Home Medications:  Allergies as of 05/08/2022       Reactions   Codeine Itching   Can take vicodin   Propoxyphene N-acetaminophen Nausea Only   Darvocet        Medication List        Accurate as of May 08, 2022  9:31 AM. If you have any questions, ask your nurse or doctor.          acetaminophen 325 MG tablet Commonly known as: Tylenol Take 2 tablets (650 mg total) by mouth every 6 (six) hours as needed for moderate pain.   Centrum Silver 50+Women Tabs Take 1 ddaily What changed:  how much to take how to take this when to take this additional instructions   etodolac 400 MG tablet Commonly known as: Lodine Take 1 tablet (400 mg total) by mouth 2 (two) times daily.   methocarbamol 500 MG tablet Commonly known as: ROBAXIN Take 1 tablet (500 mg total) by mouth 2 (two) times daily.   ondansetron 4 MG tablet Commonly known as: Zofran Take 1 tablet (4 mg total) by mouth daily as needed for nausea or vomiting.   oxyCODONE-acetaminophen  5-325 MG tablet Commonly known as: Percocet Take 1 tablet by mouth every 4 (four) hours as needed.   pantoprazole 20 MG tablet Commonly known as: Protonix Take 1 tablet (20 mg total) by mouth daily.   sertraline 50 MG tablet Commonly known as: ZOLOFT TAKE ONE (1) TABLET BY MOUTH EVERY DAY   tamsulosin 0.4 MG Caps capsule Commonly known as: Flomax Take 1 capsule (0.4 mg total) by mouth daily after supper.        Allergies:  Allergies  Allergen Reactions   Codeine Itching    Can take vicodin    Propoxyphene N-Acetaminophen Nausea Only    Darvocet    Family History: Family History  Problem Relation Age of Onset   Hypertension Brother    Kidney failure Brother        had kidney transplant   Hyperlipidemia Brother    Hypertension Sister    Hyperlipidemia Sister    Cancer Sister        cervical   Hypertension Brother    Kidney failure Father    Cancer Mother    Colon cancer Neg Hx     Social History:  reports that she has never smoked. She has never used smokeless tobacco. She reports that she does not drink alcohol and does not use drugs.  ROS: All other review of systems were reviewed and are negative except what is noted above in HPI  Physical Exam: BP (!) 143/81   Pulse 62   Constitutional:  Alert and oriented, No acute distress. HEENT: [place] AT, moist mucus membranes.  Trachea midline, no masses. Cardiovascular: No clubbing, cyanosis, or edema. Respiratory: Normal respiratory effort, no increased work of breathing. GI: Abdomen is soft, nontender, nondistended, no abdominal masses GU: No CVA tenderness.  Lymph: No cervical or inguinal lymphadenopathy. Skin: No rashes, bruises or suspicious lesions. Neurologic: Grossly intact, no focal deficits, moving all 4 extremities. Psychiatric: Normal mood and affect.  Laboratory Data: Lab Results  Component Value Date   WBC 5.6 06/12/2021   HGB 13.4 06/12/2021   HCT 38.7 06/12/2021   MCV 83 06/12/2021   PLT 213 06/12/2021    Lab Results  Component Value Date   CREATININE 0.78 06/12/2021    No results found for: "PSA"  No results found for: "TESTOSTERONE"  No results found for: "HGBA1C"  Urinalysis    Component Value Date/Time   COLORURINE AMBER (A) 05/09/2018 0949   APPEARANCEUR Hazy (A) 03/11/2022 0914   LABSPEC 1.014 05/09/2018 0949   PHURINE 5.0 05/09/2018 0949   GLUCOSEU Negative 03/11/2022 0914   HGBUR SMALL (A) 05/09/2018 0949   BILIRUBINUR Negative 03/11/2022 0914   KETONESUR negative  01/17/2022 [place] 05/09/2018 0949   PROTEINUR Negative 03/11/2022 0914   PROTEINUR NEGATIVE 05/09/2018 0949   UROBILINOGEN 0.2 01/17/2022 0931   UROBILINOGEN 0.2 01/29/2015 2352   NITRITE Negative 03/11/2022 0914   NITRITE POSITIVE (A) 05/09/2018 0949   LEUKOCYTESUR Trace (A) 03/11/2022 0914    Lab Results  Component Value Date   LABMICR See below: 03/11/2022   WBCUA 0-5 03/11/2022   RBCUA 0-2 12/17/2016   LABEPIT 0-10 03/11/2022   MUCUS Present 05/31/2019   BACTERIA Few 03/11/2022    Pertinent Imaging: Renal US 05/01/2022: Images reviewed and discussed with the patient  No results found for this or any previous visit.  No results found for this or any previous visit.  No results found for this or any previous visit.  No results found for  this or any previous visit.  Results for orders placed during the hospital encounter of 05/01/22  Ultrasound renal complete  Narrative CLINICAL DATA:  Nephrolithiasis  EXAM: RENAL / URINARY TRACT ULTRASOUND COMPLETE  COMPARISON:  CT abdomen and pelvis 02/15/2022  FINDINGS: Right Kidney:  Renal measurements: 8.3 x 3.8 x 4.3 cm = volume: 70 mL. Cortical thinning. Normal cortical echogenicity. No mass, hydronephrosis, or shadowing calcification.  Left Kidney:  Renal measurements: 9.3 x 5.2 x 4.7 cm = volume: 118 mL. Normal cortical thickness and echogenicity. No mass, hydronephrosis or shadowing calcification.  Bladder:  Appears normal for degree of bladder distention.  Other:  N/A  IMPRESSION: No evidence of renal mass or hydronephrosis.  No definite renal sonographic abnormalities identified.   Electronically Signed By: Lavonia Dana M.D. On: 05/01/2022 15:48  No valid procedures specified. No results found for this or any previous visit.  Results for orders placed in visit on 08/06/21  CT RENAL STONE STUDY  Narrative CLINICAL DATA:  Acute left flank pain.  EXAM: CT ABDOMEN AND  PELVIS WITHOUT CONTRAST  TECHNIQUE: Multidetector CT imaging of the abdomen and pelvis was performed following the standard protocol without IV contrast.  RADIATION DOSE REDUCTION: This exam was performed according to the departmental dose-optimization program which includes automated exposure control, adjustment of the mA and/or kV according to patient size and/or use of iterative reconstruction technique.  COMPARISON:  February 09, 2010.  FINDINGS: Lower chest: No acute abnormality.  Hepatobiliary: No focal liver abnormality is seen. No gallstones, gallbladder wall thickening, or biliary dilatation.  Pancreas: Unremarkable. No pancreatic ductal dilatation or surrounding inflammatory changes.  Spleen: Normal in size without focal abnormality.  Adrenals/Urinary Tract: Adrenal glands appear normal. Bilateral nephrolithiasis is noted. No hydronephrosis or renal obstruction is noted. Urinary bladder is unremarkable.  Stomach/Bowel: Stomach is within normal limits. Appendix appears normal. No evidence of bowel wall thickening, distention, or inflammatory changes.  Vascular/Lymphatic: Aortic atherosclerosis. No enlarged abdominal or pelvic lymph nodes.  Reproductive: Status post hysterectomy. No adnexal masses.  Other: Small fat containing right inguinal hernia is noted. No ascites is noted.  Musculoskeletal: No acute or significant osseous findings.  IMPRESSION: Bilateral nonobstructive nephrolithiasis. No hydronephrosis or renal obstruction is noted.  Small fat containing right inguinal hernia.  Aortic Atherosclerosis (ICD10-I70.0).   Electronically Signed By: Marijo Conception M.D. On: 08/06/2021 12:21   Assessment & Plan:    1. History of kidney stones Followup 1 year with renal US   No follow-ups on file.  [name], MD  [facility] [place]

## 2022-05-09 NOTE — Progress Notes (Signed)
Patient made aware of [place] results at ov.

## 2022-05-16 ENCOUNTER — Encounter: Payer: Self-pay | Admitting: Urology

## 2022-05-16 NOTE — Patient Instructions (Signed)

## 2022-06-27 ENCOUNTER — Encounter: Payer: Self-pay | Admitting: Adult Health

## 2022-06-27 ENCOUNTER — Ambulatory Visit (INDEPENDENT_AMBULATORY_CARE_PROVIDER_SITE_OTHER): Payer: BC Managed Care – PPO | Admitting: Adult Health

## 2022-06-27 VITALS — BP 152/85 | HR 58 | Ht 62.0 in | Wt 188.0 lb

## 2022-06-27 DIAGNOSIS — Z1211 Encounter for screening for malignant neoplasm of colon: Secondary | ICD-10-CM | POA: Diagnosis not present

## 2022-06-27 DIAGNOSIS — Z01419 Encounter for gynecological examination (general) (routine) without abnormal findings: Secondary | ICD-10-CM | POA: Diagnosis not present

## 2022-06-27 DIAGNOSIS — Z87442 Personal history of urinary calculi: Secondary | ICD-10-CM

## 2022-06-27 DIAGNOSIS — F32A Depression, unspecified: Secondary | ICD-10-CM

## 2022-06-27 DIAGNOSIS — Z90710 Acquired absence of both cervix and uterus: Secondary | ICD-10-CM

## 2022-06-27 DIAGNOSIS — Z13220 Encounter for screening for lipoid disorders: Secondary | ICD-10-CM

## 2022-06-27 DIAGNOSIS — I10 Essential (primary) hypertension: Secondary | ICD-10-CM

## 2022-06-27 LAB — HEMOCCULT GUIAC POC 1CARD (OFFICE): Fecal Occult Blood, POC: NEGATIVE

## 2022-06-27 LAB — POCT URINALYSIS DIPSTICK
Blood, UA: NEGATIVE
Glucose, UA: NEGATIVE
Ketones, UA: NEGATIVE
Leukocytes, UA: NEGATIVE
Nitrite, UA: NEGATIVE
Protein, UA: NEGATIVE

## 2022-06-27 MED ORDER — HYDROCHLOROTHIAZIDE 12.5 MG PO CAPS: 12.5000 mg | ORAL_CAPSULE | Freq: Every day | ORAL | 6 refills | Status: DC

## 2022-06-27 MED ORDER — SERTRALINE HCL 50 MG PO TABS: ORAL_TABLET | ORAL | 3 refills | Status: DC

## 2022-06-27 NOTE — Progress Notes (Signed)
Patient ID: Shelby Guerra, female   DOB: 03-20-1956, 67 y.o.   MRN: [id number] History of Present Illness: Shelby Guerra is a 67 year old black female,single, sp hysterectomy in for a well woman gyn exam. She says she has had kidney stones recently, and has some LLQ discomfort at times. She works third shift.  PCP is Shelby Loose PA.   Current Medications, Allergies, Past Medical History, Past Surgical History, Family History and Social History were reviewed in [employer] record.     Review of Systems: Patient denies any headaches, hearing loss, fatigue, blurred vision, shortness of breath, chest pain, problems with bowel movements, urination, or intercourse. No joint pain or mood swings.  See HPI for positives   Physical Exam:BP (!) 152/85 (BP Location: Right Arm, Patient Position: Sitting, Cuff Size: Normal)   Pulse (!) 58   Ht '5\' 2"'$  (1.575 m)   Wt 188 lb (85.3 kg)   BMI 34.39 kg/m  urine dipstick was negative.  General:  Well developed, well nourished, no acute distress Skin:  Warm and dry Neck:  Midline trachea, normal thyroid, good ROM, no lymphadenopathy,no carotid bruits heard Lungs; Clear to auscultation bilaterally Breast:  No dominant palpable mass, retraction, or nipple discharge Cardiovascular: Regular rate and rhythm Abdomen:  Soft, non tender, no hepatosplenomegaly Pelvic:  External genitalia is normal in appearance, no lesions.  The vagina is pale. Urethra has no lesions or masses. The cervix and uterus are absent. No adnexal masses, LLQ tenderness noted.Bladder is non tender, no masses felt. Rectal: Good sphincter tone, no polyps, or hemorrhoids felt.  Hemoccult negative. Extremities/musculoskeletal:  No swelling or varicosities noted, no clubbing or cyanosis Psych:  No mood changes, alert and cooperative,seems happy AA is 0 Fall risk is low    06/27/2022    8:41 AM 06/12/2021    8:39 AM 05/18/2020    8:39 AM  Depression screen PHQ 2/9  Decreased  Interest 0 3 0  Down, Depressed, Hopeless 1 0 0  PHQ - 2 Score 1 3 0  Altered sleeping 0 2 0  Tired, decreased energy 2 1 0  Change in appetite 0 0 0  Feeling bad or failure about yourself  0 0 0  Trouble concentrating 0 1 0  Moving slowly or fidgety/restless 0 0 0  Suicidal thoughts 0 0 0  PHQ-9 Score 3 7 0       06/27/2022    8:41 AM 06/12/2021    8:39 AM 05/18/2020    8:39 AM  GAD 7 : Generalized Anxiety Score  Nervous, Anxious, on Edge 1 0 0  Control/stop worrying 1 0 0  Worry too much - different things 2 0 0  Trouble relaxing 0 0 0  Restless 0 0 0  Easily annoyed or irritable 1 0 0  Afraid - awful might happen 2 0 0  Total GAD 7 Score 7 0 0      Upstream - 06/27/22 0835       Pregnancy Intention Screening   Does the patient want to become pregnant in the next year? N/A    Does the patient's partner want to become pregnant in the next year? N/A    Would the patient like to discuss contraceptive options today? N/A      Contraception Wrap Up   Current Method Female Sterilization   hyst   End Method Female Sterilization   hyst   Contraception Counseling Provided No  Examination chaperoned by Levy Pupa LPN  Impression and Plan: 1. Encounter for well woman exam with routine gynecological exam Physical in 1 year Check CBC and CMP Negative mammogram 02/18/2022 Colonoscopy per GI Will fill out form for work when labs back   2. Encounter for screening fecal occult blood testing Hemoccult was negative   3. S/P hysterectomy  4. Depression, unspecified depression type Continue Zoloft  5. History of kidney stones Urine negative Has some LLQ discomfort at times   6. Screening cholesterol level Check lipids   7. Hypertension, unspecified type Decrease salt and sugar Review handout on DASH diet Will rx Microzide 12.5 mg 1 daily Follow up in 3 months for BP check and ROS  Meds ordered this encounter  Medications   hydrochlorothiazide  (MICROZIDE) 12.5 MG capsule    Sig: Take 1 capsule (12.5 mg total) by mouth daily.    Dispense:  30 capsule    Refill:  6    Order Specific Question:   Supervising Provider    Answer:   Elonda Husky, LUTHER H [[id number]]   sertraline (ZOLOFT) 50 MG tablet    Sig: TAKE ONE (1) TABLET BY MOUTH EVERY DAY    Dispense:  90 tablet    Refill:  3    Order Specific Question:   Supervising Provider    Answer:   Tania Ade H [[id number]]

## 2022-06-27 NOTE — Addendum Note (Signed)
Addended by: Linton Rump on: 06/27/2022 09:15 AM   Modules accepted: Orders

## 2022-06-28 ENCOUNTER — Telehealth: Payer: Self-pay | Admitting: Adult Health

## 2022-06-28 LAB — COMPREHENSIVE METABOLIC PANEL
ALT: 14 IU/L (ref 0–32)
AST: 15 IU/L (ref 0–40)
Albumin/Globulin Ratio: 1.7 (ref 1.2–2.2)
Albumin: 4.3 g/dL (ref 3.9–4.9)
Alkaline Phosphatase: 104 IU/L (ref 44–121)
BUN/Creatinine Ratio: 24 (ref 12–28)
BUN: 21 mg/dL (ref 8–27)
Bilirubin Total: <0.2 mg/dL (ref 0.0–1.2)
CO2: 23 mmol/L (ref 20–29)
Calcium: 9.6 mg/dL (ref 8.7–10.3)
Chloride: 104 mmol/L (ref 96–106)
Creatinine, Ser: 0.86 mg/dL (ref 0.57–1.00)
Globulin, Total: 2.6 g/dL (ref 1.5–4.5)
Glucose: 80 mg/dL (ref 70–99)
Potassium: 4.4 mmol/L (ref 3.5–5.2)
Sodium: 142 mmol/L (ref 134–144)
Total Protein: 6.9 g/dL (ref 6.0–8.5)
eGFR: 75 mL/min/{1.73_m2} (ref >59)

## 2022-06-28 LAB — CBC
Hematocrit: 40.3 % (ref 34.0–46.6)
Hemoglobin: 13.4 g/dL (ref 11.1–15.9)
MCH: 28.7 pg (ref 26.6–33.0)
MCHC: 33.3 g/dL (ref 31.5–35.7)
MCV: 86 fL (ref 79–97)
Platelets: 236 10*3/uL (ref 150–450)
RBC: 4.67 x10E6/uL (ref 3.77–5.28)
RDW: 12.3 % (ref 11.7–15.4)
WBC: 6 10*3/uL (ref 3.4–10.8)

## 2022-06-28 LAB — LIPID PANEL
Chol/HDL Ratio: 3.5 ratio (ref 0.0–4.4)
Cholesterol, Total: 206 mg/dL — ABNORMAL HIGH (ref 100–199)
HDL: 59 mg/dL (ref >39)
LDL Chol Calc (NIH): 133 mg/dL — ABNORMAL HIGH (ref 0–99)
Triglycerides: 76 mg/dL (ref 0–149)
VLDL Cholesterol Cal: 14 mg/dL (ref 5–40)

## 2022-06-28 NOTE — Telephone Encounter (Signed)
Called pt and reviewed labs and let her know form was ready for pick up, on my desk.

## 2022-09-05 ENCOUNTER — Ambulatory Visit: Payer: BC Managed Care – PPO | Admitting: Adult Health

## 2022-09-16 ENCOUNTER — Ambulatory Visit: Payer: BC Managed Care – PPO | Admitting: Adult Health

## 2022-09-19 ENCOUNTER — Encounter: Payer: Self-pay | Admitting: Adult Health

## 2022-09-19 ENCOUNTER — Ambulatory Visit (INDEPENDENT_AMBULATORY_CARE_PROVIDER_SITE_OTHER): Payer: BC Managed Care – PPO | Admitting: Adult Health

## 2022-09-19 VITALS — BP 137/85 | HR 70 | Ht 62.0 in | Wt 191.0 lb

## 2022-09-19 DIAGNOSIS — I10 Essential (primary) hypertension: Secondary | ICD-10-CM | POA: Diagnosis not present

## 2022-09-19 MED ORDER — AMLODIPINE BESYLATE 5 MG PO TABS: 5.0000 mg | ORAL_TABLET | Freq: Every day | ORAL | 3 refills | Status: DC

## 2022-09-19 NOTE — Progress Notes (Signed)
  Subjective:     Patient ID: Shelby Guerra, female   DOB: 09-18-1955, 67 y.o.   MRN: [id number]  HPI Shelby Guerra is a 67 year old black female, single, sp hysterectomy in for BP check, she did work last night and is tired.   PCP is Candise Bowens PA. Review of Systems Feels good, tired, worked last night Denies any headaches  Reviewed past medical,surgical, social and family history. Reviewed medications and allergies.     Objective:   Physical Exam BP 137/85 (BP Location: Right Arm, Patient Position: Sitting, Cuff Size: Normal)   Pulse 70   Ht 5\' 2"  (1.575 m)   Wt 191 lb (86.6 kg)   BMI 34.93 kg/m  Skin warm and dry. Lungs: clear to ausculation bilaterally. Cardiovascular: regular rate and rhythm.    Fall risk is low  Upstream - 09/19/22 0937       Pregnancy Intention Screening   Does the patient want to become pregnant in the next year? N/A    Does the patient's partner want to become pregnant in the next year? N/A    Would the patient like to discuss contraceptive options today? N/A      Contraception Wrap Up   Current Method Female Sterilization   hyst   End Method Female Sterilization   hyst   Contraception Counseling Provided No             Assessment:     1. Hypertension, unspecified type BP is better Continue watching salt and sugars Continue Microzide 12.5 mg 1 daily,has refills  Will add Norvasc 5 mg 1 daily  Meds ordered this encounter  Medications   amLODipine (NORVASC) 5 MG tablet    Sig: Take 1 tablet (5 mg total) by mouth daily.    Dispense:  30 tablet    Refill:  3    Order Specific Question:   Supervising Provider    Answer:   Duane Lope H [[id number]]   Try to lose some weight     Plan:     Follow up in 3 months with me for BP check

## 2022-11-28 ENCOUNTER — Other Ambulatory Visit: Payer: Self-pay | Admitting: Adult Health

## 2022-11-28 ENCOUNTER — Telehealth: Payer: Self-pay

## 2022-11-28 NOTE — Telephone Encounter (Signed)
Patient called and wanted to speak to [facility] about a medication that was denied.

## 2022-11-28 NOTE — Telephone Encounter (Signed)
Left message @ 12:56 pm. JSY

## 2022-11-29 NOTE — Telephone Encounter (Signed)
Can you send a prescription in for Protonix to [facility]? Thanks! JSY

## 2022-12-02 MED ORDER — PANTOPRAZOLE SODIUM 20 MG PO TBEC: 20.0000 mg | DELAYED_RELEASE_TABLET | Freq: Every day | ORAL | 6 refills | Status: DC

## 2022-12-02 NOTE — Telephone Encounter (Signed)
Rx sent in for protonix.  

## 2022-12-17 ENCOUNTER — Other Ambulatory Visit (HOSPITAL_COMMUNITY): Payer: Self-pay | Admitting: Adult Health

## 2022-12-17 DIAGNOSIS — Z1231 Encounter for screening mammogram for malignant neoplasm of breast: Secondary | ICD-10-CM

## 2022-12-20 ENCOUNTER — Ambulatory Visit: Payer: BC Managed Care – PPO | Admitting: Adult Health

## 2023-01-03 ENCOUNTER — Ambulatory Visit: Payer: BC Managed Care – PPO | Admitting: Adult Health

## 2023-01-03 ENCOUNTER — Encounter: Payer: Self-pay | Admitting: Adult Health

## 2023-01-03 VITALS — BP 132/73 | HR 59 | Ht 62.0 in | Wt 181.0 lb

## 2023-01-03 DIAGNOSIS — I10 Essential (primary) hypertension: Secondary | ICD-10-CM

## 2023-01-03 MED ORDER — AMLODIPINE BESYLATE 5 MG PO TABS: 5.0000 mg | ORAL_TABLET | Freq: Every day | ORAL | 6 refills | Status: DC

## 2023-01-03 MED ORDER — HYDROCHLOROTHIAZIDE 12.5 MG PO CAPS: 12.5000 mg | ORAL_CAPSULE | Freq: Every day | ORAL | 6 refills | Status: DC

## 2023-01-03 NOTE — Progress Notes (Signed)
  Subjective:     Patient ID: Shelby Guerra, female   DOB: November 24, 1955, 67 y.o.   MRN: [id number]  HPI Ameina is a 67 year old black female,single, sp hysterectomy in for follow up on BP. Has stress at home, son lives with her and he is a drinker. She worked last night.   PCP is Candise Bowens. PA  Review of Systems Stress at home Denies any problems with BP  Reviewed past medical,surgical, social and family history. Reviewed medications and allergies.     Objective:   Physical Exam BP 132/73 (BP Location: Left Arm, Patient Position: Sitting, Cuff Size: Normal)   Pulse (!) 59   Ht 5\' 2"  (1.575 m)   Wt 181 lb (82.1 kg)   BMI 33.11 kg/m     Skin warm and dry.  Lungs: clear to ausculation bilaterally. Cardiovascular: regular rate and rhythm.  Fall risk is low  Upstream - 01/03/23 0903       Pregnancy Intention Screening   Does the patient want to become pregnant in the next year? N/A    Does the patient's partner want to become pregnant in the next year? N/A    Would the patient like to discuss contraceptive options today? N/A      Contraception Wrap Up   Current Method Female Sterilization   hyst   End Method Female Sterilization   hyst   Contraception Counseling Provided No             Assessment:     1. Hypertension, unspecified type BP is good today Continue meds, will refill Microzide 12.5 mg 1 daily and Norvasc 5 mg 1 daily   Meds ordered this encounter  Medications   hydrochlorothiazide (MICROZIDE) 12.5 MG capsule    Sig: Take 1 capsule (12.5 mg total) by mouth daily.    Dispense:  30 capsule    Refill:  6    Order Specific Question:   Supervising Provider    Answer:   Duane Lope H [[id number]]   amLODipine (NORVASC) 5 MG tablet    Sig: Take 1 tablet (5 mg total) by mouth daily.    Dispense:  30 tablet    Refill:  6    Order Specific Question:   Supervising Provider    Answer:   Lazaro Arms [[id number]]       Plan:    Discussed getting help for son, she says  he would not go. But did mention [facility] Aye to her Return in 6 months for physical and BP check

## 2023-02-17 ENCOUNTER — Ambulatory Visit
Admission: EM | Admit: 2023-02-17 | Discharge: 2023-02-17 | Disposition: A | Discharge status: Home / Self Care | Payer: BC Managed Care – PPO | Attending: Nurse Practitioner | Admitting: Nurse Practitioner

## 2023-02-17 DIAGNOSIS — R109 Unspecified abdominal pain: Secondary | ICD-10-CM | POA: Diagnosis present

## 2023-02-17 LAB — POCT URINALYSIS DIP (MANUAL ENTRY)
Bilirubin, UA: NEGATIVE
Blood, UA: NEGATIVE
Glucose, UA: NEGATIVE mg/dL
Ketones, POC UA: NEGATIVE mg/dL
Nitrite, UA: NEGATIVE
Protein Ur, POC: NEGATIVE mg/dL
Spec Grav, UA: 1.02 (ref 1.010–1.025)
Urobilinogen, UA: 0.2 U/dL
pH, UA: 6 (ref 5.0–8.0)

## 2023-02-17 MED ORDER — NITROFURANTOIN MONOHYD MACRO 100 MG PO CAPS: 100.0000 mg | ORAL_CAPSULE | Freq: Two times a day (BID) | ORAL | 0 refills | Status: DC

## 2023-02-17 MED ORDER — KETOROLAC TROMETHAMINE 30 MG/ML IJ SOLN: 30.0000 mg | Freq: Once | INTRAMUSCULAR | Status: DC

## 2023-02-17 MED ORDER — KETOROLAC TROMETHAMINE 30 MG/ML IJ SOLN
15.0000 mg | Freq: Once | INTRAMUSCULAR | Status: AC
  Administered 2023-02-17: 15 mg via INTRAMUSCULAR

## 2023-02-17 NOTE — Discharge Instructions (Addendum)
You were given an injection of Toradol.  Do not take any additional ibuprofen, Advil, or Aleve for the next 24 hours. Urine culture is pending.  As discussed, if the results of the culture are negative, you will be contacted and asked to stop the antibiotic. Take medication as prescribed. Continue over-the-counter Tylenol as needed for pain, fever, or general discomfort. Make sure you are drinking at least 8-10 8 ounce glasses of water daily. Recommend a toileting schedule that will allow you to urinate every 2 hours. If your urine culture is negative, and you are continuing to experience symptoms, would like for you to follow-up with Dr. Ronne Binning for further evaluation. Follow-up as needed.

## 2023-02-17 NOTE — ED Triage Notes (Signed)
Pt c/o back pain and  flank pain, x 3 days ago.

## 2023-02-17 NOTE — ED Provider Notes (Signed)
[facility]    CSN: [id number] Arrival date & time: 02/17/23  1737      History   Chief Complaint No chief complaint on file.   HPI Shelby Guerra is a 67 y.o. female.   The history is provided by the patient.   Patient presents for complaints of left-sided flank pain has been present for the past 2 days.  Patient also complains of urinary frequency and urgency.  She denies fever, chills, pain with urination, decreased urine stream, abdominal pain, low back pain, or vaginal symptoms.  Patient reports that she does have a history of kidney stones, last episode was approximately 1 year ago.  She states that she did have to have lithotripsy to have the stones removed.  Patient reports she has been taking Tylenol for her symptoms. Past Medical History:  Diagnosis Date   GERD (gastroesophageal reflux disease)    High cholesterol    History of kidney stones    Hot flashes 03/10/2013   Hypertension    pt denies   Impaired fasting glucose    Moody 03/14/2014    Patient Active Problem List   Diagnosis Date Noted   Hypertension 06/27/2022   S/P hysterectomy 06/12/2021   Screening cholesterol level 06/12/2021   Encounter for screening fecal occult blood testing 05/18/2020   Hematuria 05/31/2019   Dysuria 05/31/2019   History of kidney stones 05/31/2019   Encounter for well woman exam with routine gynecological exam 05/10/2019   Urinary frequency 05/10/2019   Screening for colorectal cancer 05/05/2018   Left knee pain 05/22/2017   Well female exam with routine gynecological exam 05/22/2017   Depression 02/01/2015   Moody 03/14/2014   Hot flashes 03/10/2013   GERD 11/29/2009   CONSTIPATION, CHRONIC 11/29/2009   NAUSEA, CHRONIC 11/29/2009    Past Surgical History:  Procedure Laterality Date   COLONOSCOPY  12/13/2008   COLONOSCOPY N/A 03/05/2019   Procedure: COLONOSCOPY;  Surgeon: Corbin Ade, MD;  [facility];  Service: Endoscopy;  Laterality:  N/A;  10:30   CYSTOSCOPY WITH RETROGRADE PYELOGRAM, URETEROSCOPY AND STENT PLACEMENT Bilateral 08/27/2021   Procedure: BILATERAL RETROGRADE PYELOGRAM, BILATERAL URETEROSCOPY;  Surgeon: Malen Gauze, MD;  Location: [facility];  Service: Urology;  Laterality: Bilateral;   CYSTOSCOPY WITH RETROGRADE PYELOGRAM, URETEROSCOPY AND STENT PLACEMENT Right 09/13/2021   Procedure: CYSTOSCOPY WITH RETROGRADE PYELOGRAM, URETEROSCOPY AND STENT REMOVAL;  Surgeon: Malen Gauze, MD;  Location: [facility];  Service: Urology;  Laterality: Right;   CYSTOSCOPY WITH STENT PLACEMENT Right 08/27/2021   Procedure: CYSTOSCOPY WITH STENT PLACEMENT;  Surgeon: Malen Gauze, MD;  Location: [facility];  Service: Urology;  Laterality: Right;   INGUINAL HERNIA REPAIR Left 02/26/2006   recurrent   INGUINAL HERNIA REPAIR Left    prior to 2007   KNEE ARTHROSCOPY WITH MEDIAL MENISECTOMY Left 12/04/2017   Procedure: KNEE ARTHROSCOPY WITH MEDIAL MENISCECTOMY AND CHONDROPLASTY;  Surgeon: Bjorn Pippin, MD;  Location: [facility];  Service: Orthopedics;  Laterality: Left;  NO BLOCK   POLYPECTOMY  03/05/2019   Procedure: POLYPECTOMY;  Surgeon: Corbin Ade, MD;  [facility]: [facility];  Service: Endoscopy;;   STONE EXTRACTION WITH BASKET Bilateral 08/27/2021   Procedure: STONE EXTRACTION WITH BASKET;  Surgeon: Malen Gauze, MD;  Location: [facility];  Service: Urology;  Laterality: Bilateral;   TOTAL ABDOMINAL HYSTERECTOMY W/ BILATERAL SALPINGOOPHORECTOMY  10/15/2006   TUBAL LIGATION      OB History     Gravida  1   Para  1   Term  1   Preterm      AB      Living  1      SAB      IAB      Ectopic      Multiple      Live Births  1            Home Medications    Prior to Admission medications   Medication Sig Start Date End Date Taking? Authorizing Provider  nitrofurantoin, macrocrystal-monohydrate, (MACROBID) 100 MG capsule Take 1 capsule (100 mg total) by mouth 2 (two) times  daily. 02/17/23  Yes Jarick Harkins-Warren, Sadie Haber, NP  acetaminophen (TYLENOL) 325 MG tablet Take 2 tablets (650 mg total) by mouth every 6 (six) hours as needed for moderate pain. 01/17/22   Wallis Bamberg, PA-C  amLODipine (NORVASC) 5 MG tablet Take 1 tablet (5 mg total) by mouth daily. 01/03/23   Adline Potter, NP  hydrochlorothiazide (MICROZIDE) 12.5 MG capsule Take 1 capsule (12.5 mg total) by mouth daily. 01/03/23   Adline Potter, NP  Multiple Vitamins-Minerals (CENTRUM SILVER 50+WOMEN) TABS Take 1 ddaily Patient taking differently: Take 1 tablet by mouth daily. 05/18/20   Adline Potter, NP  pantoprazole (PROTONIX) 20 MG tablet Take 1 tablet (20 mg total) by mouth daily. 12/02/22   Adline Potter, NP  sertraline (ZOLOFT) 50 MG tablet TAKE ONE (1) TABLET BY MOUTH EVERY DAY 06/27/22   Adline Potter, NP    Family History Family History  Problem Relation Age of Onset   Hypertension Brother    Kidney failure Brother        had kidney transplant   Hyperlipidemia Brother    Hypertension Sister    Hyperlipidemia Sister    Cancer Sister        cervical   Hypertension Brother    Kidney failure Father    Cancer Mother    Colon cancer Neg Hx     Social History Social History   Tobacco Use   Smoking status: Never   Smokeless tobacco: Never  Vaping Use   Vaping status: Never Used  Substance Use Topics   Alcohol use: No   Drug use: No     Allergies   Codeine and Propoxyphene n-acetaminophen   Review of Systems Review of Systems Per HPi  Physical Exam Triage Vital Signs ED Triage Vitals  Encounter Vitals Group     BP 02/17/23 1903 130/70     Systolic BP Percentile --      Diastolic BP Percentile --      Pulse Rate 02/17/23 1903 60     Resp 02/17/23 1903 15     Temp 02/17/23 1903 98 F (36.7 C)     Temp Source 02/17/23 1903 Oral     SpO2 02/17/23 1903 97 %     Weight --      Height --      Head Circumference --      Peak Flow --      Pain Score  02/17/23 1905 8     Pain Loc --      Pain Education --      Exclude from Growth Chart --    No data found.  Updated Vital Signs BP 130/70 (BP Location: Right Arm)   Pulse 60   Temp 98 F (36.7 C) (Oral)   Resp 15   SpO2 97%   Visual Acuity Right  Eye Distance:   Left Eye Distance:   Bilateral Distance:    Right Eye Near:   Left Eye Near:    Bilateral Near:     Physical Exam Vitals and nursing note reviewed.  Constitutional:      General: She is not in acute distress.    Appearance: Normal appearance.  HENT:     Head: Normocephalic.  Eyes:     Extraocular Movements: Extraocular movements intact.     Pupils: Pupils are equal, round, and reactive to light.  Cardiovascular:     Rate and Rhythm: Normal rate and regular rhythm.     Pulses: Normal pulses.     Heart sounds: Normal heart sounds.  Pulmonary:     Effort: Pulmonary effort is normal.     Breath sounds: Normal breath sounds.  Abdominal:     General: Bowel sounds are normal.     Palpations: Abdomen is soft.     Tenderness: There is no abdominal tenderness. There is left CVA tenderness.  Musculoskeletal:     Cervical back: Normal range of motion.  Lymphadenopathy:     Cervical: No cervical adenopathy.  Skin:    General: Skin is warm and dry.  Neurological:     General: No focal deficit present.     Mental Status: She is alert and oriented to person, place, and time.  Psychiatric:        Mood and Affect: Mood normal.        Behavior: Behavior normal.      UC Treatments / Results  Labs (all labs ordered are listed, but only abnormal results are displayed) Labs Reviewed  POCT URINALYSIS DIP (MANUAL ENTRY) - Abnormal; Notable for the following components:      Result Value   Leukocytes, UA Small (1+) (*)    All other components within normal limits  URINE CULTURE    EKG   Radiology No results found.  Procedures Procedures (including critical care time)  Medications Ordered in  UC Medications  ketorolac (TORADOL) 30 MG/ML injection 15 mg (has no administration in time range)    Initial Impression / Assessment and Plan / UC Course  I have reviewed the triage vital signs and the nursing notes.  Pertinent labs & imaging results that were available during my care of the patient were reviewed by me and considered in my medical decision making (see chart for details).  The patient is well-appearing, she is in no acute distress, vital signs are stable.    *** Final Clinical Impressions(s) / UC Diagnoses   Final diagnoses:  Left flank pain     Discharge Instructions      You were given an injection of Toradol.  Do not take any additional ibuprofen, Advil, or Aleve for the next 24 hours. Urine culture is pending.  As discussed, if the results of the culture are negative, you will be contacted and asked to stop the antibiotic. Take medication as prescribed. Continue over-the-counter Tylenol as needed for pain, fever, or general discomfort. Make sure you are drinking at least 8-10 8 ounce glasses of water daily. Recommend a toileting schedule that will allow you to urinate every 2 hours. If your urine culture is negative, and you are continuing to experience symptoms, would like for you to follow-up with Dr. Ronne Binning for further evaluation. Follow-up as needed.     ED Prescriptions     Medication Sig Dispense Auth. Provider   nitrofurantoin, macrocrystal-monohydrate, (MACROBID) 100 MG capsule Take 1 capsule (  100 mg total) by mouth 2 (two) times daily. 10 capsule Lauretta Sallas-Warren, Sadie Haber, NP      PDMP not reviewed this encounter.

## 2023-02-18 LAB — URINE CULTURE: Culture: <10000 — AB

## 2023-02-19 ENCOUNTER — Ambulatory Visit (INDEPENDENT_AMBULATORY_CARE_PROVIDER_SITE_OTHER): Payer: BC Managed Care – PPO | Admitting: Urology

## 2023-02-19 ENCOUNTER — Ambulatory Visit (HOSPITAL_COMMUNITY)
Admission: RE | Admit: 2023-02-19 | Discharge: 2023-02-19 | Disposition: A | Discharge status: Home / Self Care | Payer: BC Managed Care – PPO | Source: Ambulatory Visit | Attending: Urology | Admitting: Urology

## 2023-02-19 VITALS — BP 138/77 | HR 55

## 2023-02-19 DIAGNOSIS — R1012 Left upper quadrant pain: Secondary | ICD-10-CM | POA: Diagnosis not present

## 2023-02-19 DIAGNOSIS — R109 Unspecified abdominal pain: Secondary | ICD-10-CM | POA: Diagnosis not present

## 2023-02-19 DIAGNOSIS — Z87442 Personal history of urinary calculi: Secondary | ICD-10-CM

## 2023-02-19 LAB — URINALYSIS, ROUTINE W REFLEX MICROSCOPIC
Bilirubin, UA: NEGATIVE
Glucose, UA: NEGATIVE
Ketones, UA: NEGATIVE
Nitrite, UA: NEGATIVE
Protein,UA: NEGATIVE
RBC, UA: NEGATIVE
Specific Gravity, UA: 1.025 (ref 1.005–1.030)
Urobilinogen, Ur: 1 mg/dL (ref 0.2–1.0)
pH, UA: 6 (ref 5.0–7.5)

## 2023-02-19 LAB — MICROSCOPIC EXAMINATION: Epithelial Cells (non renal): >10 /HPF — AB (ref 0–10)

## 2023-02-19 NOTE — Progress Notes (Signed)
02/19/2023 11:10 AM   Shelby Guerra 1956/02/04 [id number]  Referring provider: Lawerance Sabal, PA [address] [place],  [place] [address]  Left flank pain   HPI: Shelby Guerra is a 66yo here for followup for nephrolithiasis. 3-4 days ago she developed intermittent left flank pain with associated nausea. Pain is exacerbated with activity. UA shows WBCs, bacteria, and epithelial cells. No stone events since last visit. No hematuria   PMH: Past Medical History:  Diagnosis Date   GERD (gastroesophageal reflux disease)    High cholesterol    History of kidney stones    Hot flashes 03/10/2013   Hypertension    pt denies   Impaired fasting glucose    Moody 03/14/2014    Surgical History: Past Surgical History:  Procedure Laterality Date   COLONOSCOPY  12/13/2008   COLONOSCOPY N/A 03/05/2019   Procedure: COLONOSCOPY;  Surgeon: Corbin Ade, MD;  [facility];  Service: Endoscopy;  Laterality: N/A;  10:30   CYSTOSCOPY WITH RETROGRADE PYELOGRAM, URETEROSCOPY AND STENT PLACEMENT Bilateral 08/27/2021   Procedure: BILATERAL RETROGRADE PYELOGRAM, BILATERAL URETEROSCOPY;  Surgeon: Malen Gauze, MD;  Location: [facility];  Service: Urology;  Laterality: Bilateral;   CYSTOSCOPY WITH RETROGRADE PYELOGRAM, URETEROSCOPY AND STENT PLACEMENT Right 09/13/2021   Procedure: CYSTOSCOPY WITH RETROGRADE PYELOGRAM, URETEROSCOPY AND STENT REMOVAL;  Surgeon: Malen Gauze, MD;  Location: [facility];  Service: Urology;  Laterality: Right;   CYSTOSCOPY WITH STENT PLACEMENT Right 08/27/2021   Procedure: CYSTOSCOPY WITH STENT PLACEMENT;  Surgeon: Malen Gauze, MD;  Location: [facility];  Service: Urology;  Laterality: Right;   INGUINAL HERNIA REPAIR Left 02/26/2006   recurrent   INGUINAL HERNIA REPAIR Left    prior to 2007   KNEE ARTHROSCOPY WITH MEDIAL MENISECTOMY Left 12/04/2017   Procedure: KNEE ARTHROSCOPY WITH MEDIAL MENISCECTOMY AND CHONDROPLASTY;  Surgeon: Bjorn Pippin, MD;  Location:  [facility];  Service: Orthopedics;  Laterality: Left;  NO BLOCK   POLYPECTOMY  03/05/2019   Procedure: POLYPECTOMY;  Surgeon: Corbin Ade, MD;  [facility];  Service: Endoscopy;;   STONE EXTRACTION WITH BASKET Bilateral 08/27/2021   Procedure: STONE EXTRACTION WITH BASKET;  Surgeon: Malen Gauze, MD;  Location: [facility];  Service: Urology;  Laterality: Bilateral;   TOTAL ABDOMINAL HYSTERECTOMY W/ BILATERAL SALPINGOOPHORECTOMY  10/15/2006   TUBAL LIGATION      Home Medications:  Allergies as of 02/19/2023       Reactions   Codeine Itching   Can take vicodin   Propoxyphene N-acetaminophen Nausea Only   Darvocet        Medication List        Accurate as of February 19, 2023 11:10 AM. If you have any questions, ask your nurse or doctor.          acetaminophen 325 MG tablet Commonly known as: Tylenol Take 2 tablets (650 mg total) by mouth every 6 (six) hours as needed for moderate pain.   amLODipine 5 MG tablet Commonly known as: Norvasc Take 1 tablet (5 mg total) by mouth daily.   Centrum Silver 50+Women Tabs Take 1 ddaily What changed:  how much to take how to take this when to take this additional instructions   hydrochlorothiazide 12.5 MG capsule Commonly known as: MICROZIDE Take 1 capsule (12.5 mg total) by mouth daily.   nitrofurantoin (macrocrystal-monohydrate) 100 MG capsule Commonly known as: MACROBID Take 1 capsule (100 mg total) by mouth 2 (two) times daily.  pantoprazole 20 MG tablet Commonly known as: Protonix Take 1 tablet (20 mg total) by mouth daily.   sertraline 50 MG tablet Commonly known as: ZOLOFT TAKE ONE (1) TABLET BY MOUTH EVERY DAY        Allergies:  Allergies  Allergen Reactions   Codeine Itching    Can take vicodin   Propoxyphene N-Acetaminophen Nausea Only    Darvocet    Family History: Family History  Problem Relation Age of Onset   Hypertension Brother    Kidney failure Brother         had kidney transplant   Hyperlipidemia Brother    Hypertension Sister    Hyperlipidemia Sister    Cancer Sister        cervical   Hypertension Brother    Kidney failure Father    Cancer Mother    Colon cancer Neg Hx     Social History:  reports that she has never smoked. She has never used smokeless tobacco. She reports that she does not drink alcohol and does not use drugs.  ROS: All other review of systems were reviewed and are negative except what is noted above in HPI  Physical Exam: BP 138/77   Pulse (!) 55   Constitutional:  Alert and oriented, No acute distress. HEENT: [place] AT, moist mucus membranes.  Trachea midline, no masses. Cardiovascular: No clubbing, cyanosis, or edema. Respiratory: Normal respiratory effort, no increased work of breathing. GI: Abdomen is soft, nontender, nondistended, no abdominal masses GU: No CVA tenderness.  Lymph: No cervical or inguinal lymphadenopathy. Skin: No rashes, bruises or suspicious lesions. Neurologic: Grossly intact, no focal deficits, moving all 4 extremities. Psychiatric: Normal mood and affect.  Laboratory Data: Lab Results  Component Value Date   WBC 6.0 06/27/2022   HGB 13.4 06/27/2022   HCT 40.3 06/27/2022   MCV 86 06/27/2022   PLT 236 06/27/2022    Lab Results  Component Value Date   CREATININE 0.86 06/27/2022    No results found for: "PSA"  No results found for: "TESTOSTERONE"  No results found for: "HGBA1C"  Urinalysis    Component Value Date/Time   COLORURINE AMBER (A) 05/09/2018 0949   APPEARANCEUR Hazy (A) 03/11/2022 0914   LABSPEC 1.014 05/09/2018 0949   PHURINE 5.0 05/09/2018 0949   GLUCOSEU Negative 03/11/2022 0914   HGBUR SMALL (A) 05/09/2018 0949   BILIRUBINUR negative 02/17/2023 1909   BILIRUBINUR Negative 03/11/2022 0914   KETONESUR negative 02/17/2023 1909   KETONESUR NEGATIVE 05/09/2018 0949   PROTEINUR negative 02/17/2023 1909   PROTEINUR Negative 06/27/2022 0914   PROTEINUR  Negative 03/11/2022 0914   PROTEINUR NEGATIVE 05/09/2018 0949   UROBILINOGEN 0.2 02/17/2023 1909   UROBILINOGEN 0.2 01/29/2015 2352   NITRITE Negative 02/17/2023 1909   NITRITE neg 06/27/2022 0914   NITRITE Negative 03/11/2022 0914   NITRITE POSITIVE (A) 05/09/2018 0949   LEUKOCYTESUR Small (1+) (A) 02/17/2023 1909   LEUKOCYTESUR Trace (A) 03/11/2022 0914    Lab Results  Component Value Date   LABMICR See below: 03/11/2022   WBCUA 0-5 03/11/2022   RBCUA 0-2 12/17/2016   LABEPIT 0-10 03/11/2022   MUCUS Present 05/31/2019   BACTERIA Few 03/11/2022    Pertinent Imaging:  No results found for this or any previous visit.  No results found for this or any previous visit.  No results found for this or any previous visit.  No results found for this or any previous visit.  Results for orders placed during the hospital encounter  of 05/01/22  Ultrasound renal complete  Narrative CLINICAL DATA:  Nephrolithiasis  EXAM: RENAL / URINARY TRACT ULTRASOUND COMPLETE  COMPARISON:  CT abdomen and pelvis 02/15/2022  FINDINGS: Right Kidney:  Renal measurements: 8.3 x 3.8 x 4.3 cm = volume: 70 mL. Cortical thinning. Normal cortical echogenicity. No mass, hydronephrosis, or shadowing calcification.  Left Kidney:  Renal measurements: 9.3 x 5.2 x 4.7 cm = volume: 118 mL. Normal cortical thickness and echogenicity. No mass, hydronephrosis or shadowing calcification.  Bladder:  Appears normal for degree of bladder distention.  Other:  N/A  IMPRESSION: No evidence of renal mass or hydronephrosis.  No definite renal sonographic abnormalities identified.   Electronically Signed By: Ulyses Southward M.D. On: 05/01/2022 15:48  No valid procedures specified. No results found for this or any previous visit.  Results for orders placed in visit on 08/06/21  CT RENAL STONE STUDY  Narrative CLINICAL DATA:  Acute left flank pain.  EXAM: CT ABDOMEN AND PELVIS WITHOUT  CONTRAST  TECHNIQUE: Multidetector CT imaging of the abdomen and pelvis was performed following the standard protocol without IV contrast.  RADIATION DOSE REDUCTION: This exam was performed according to the departmental dose-optimization program which includes automated exposure control, adjustment of the mA and/or kV according to patient size and/or use of iterative reconstruction technique.  COMPARISON:  February 09, 2010.  FINDINGS: Lower chest: No acute abnormality.  Hepatobiliary: No focal liver abnormality is seen. No gallstones, gallbladder wall thickening, or biliary dilatation.  Pancreas: Unremarkable. No pancreatic ductal dilatation or surrounding inflammatory changes.  Spleen: Normal in size without focal abnormality.  Adrenals/Urinary Tract: Adrenal glands appear normal. Bilateral nephrolithiasis is noted. No hydronephrosis or renal obstruction is noted. Urinary bladder is unremarkable.  Stomach/Bowel: Stomach is within normal limits. Appendix appears normal. No evidence of bowel wall thickening, distention, or inflammatory changes.  Vascular/Lymphatic: Aortic atherosclerosis. No enlarged abdominal or pelvic lymph nodes.  Reproductive: Status post hysterectomy. No adnexal masses.  Other: Small fat containing right inguinal hernia is noted. No ascites is noted.  Musculoskeletal: No acute or significant osseous findings.  IMPRESSION: Bilateral nonobstructive nephrolithiasis. No hydronephrosis or renal obstruction is noted.  Small fat containing right inguinal hernia.  Aortic Atherosclerosis (ICD10-I70.0).   Electronically Signed By: Lupita Raider M.D. On: 08/06/2021 12:21   Assessment & Plan:    1. History of kidney stones -Ct stone studay - Urinalysis, Routine w reflex microscopic     No follow-ups on file.  Wilkie Aye, MD  [facility] [place]

## 2023-02-20 ENCOUNTER — Telehealth: Payer: Self-pay | Admitting: Urology

## 2023-02-20 NOTE — Telephone Encounter (Signed)
Patient needs a letter taking her out of work until 02/24/23, she is still having a lot of pain. She will pick letter up when she comes into her appointment tomorrow .

## 2023-02-21 ENCOUNTER — Ambulatory Visit (INDEPENDENT_AMBULATORY_CARE_PROVIDER_SITE_OTHER): Payer: BC Managed Care – PPO | Admitting: Urology

## 2023-02-21 ENCOUNTER — Encounter (HOSPITAL_COMMUNITY): Payer: Self-pay

## 2023-02-21 ENCOUNTER — Ambulatory Visit (HOSPITAL_COMMUNITY)
Admission: RE | Admit: 2023-02-21 | Discharge: 2023-02-21 | Disposition: A | Discharge status: Home / Self Care | Payer: BC Managed Care – PPO | Source: Ambulatory Visit | Attending: Adult Health | Admitting: Adult Health

## 2023-02-21 VITALS — BP 132/76 | HR 62

## 2023-02-21 DIAGNOSIS — Z1231 Encounter for screening mammogram for malignant neoplasm of breast: Secondary | ICD-10-CM | POA: Diagnosis present

## 2023-02-21 DIAGNOSIS — Z09 Encounter for follow-up examination after completed treatment for conditions other than malignant neoplasm: Secondary | ICD-10-CM

## 2023-02-21 DIAGNOSIS — Z87442 Personal history of urinary calculi: Secondary | ICD-10-CM

## 2023-02-21 MED ORDER — CYCLOBENZAPRINE HCL 5 MG PO TABS: 5.0000 mg | ORAL_TABLET | Freq: Three times a day (TID) | ORAL | 3 refills | Status: DC | PRN

## 2023-02-21 NOTE — Progress Notes (Unsigned)
02/21/2023 11:45 AM   Shelby Guerra June 19, 1955 [id number]  Referring provider: Lawerance Sabal, PA [address] [place],  [place] [address]  No chief complaint on file.   HPI:    PMH: Past Medical History:  Diagnosis Date   GERD (gastroesophageal reflux disease)    High cholesterol    History of kidney stones    Hot flashes 03/10/2013   Hypertension    pt denies   Impaired fasting glucose    Moody 03/14/2014    Surgical History: Past Surgical History:  Procedure Laterality Date   COLONOSCOPY  12/13/2008   COLONOSCOPY N/A 03/05/2019   Procedure: COLONOSCOPY;  Surgeon: Corbin Ade, MD;  [facility]: [facility];  Service: Endoscopy;  Laterality: N/A;  10:30   CYSTOSCOPY WITH RETROGRADE PYELOGRAM, URETEROSCOPY AND STENT PLACEMENT Bilateral 08/27/2021   Procedure: BILATERAL RETROGRADE PYELOGRAM, BILATERAL URETEROSCOPY;  Surgeon: Malen Gauze, MD;  Location: [facility];  Service: Urology;  Laterality: Bilateral;   CYSTOSCOPY WITH RETROGRADE PYELOGRAM, URETEROSCOPY AND STENT PLACEMENT Right 09/13/2021   Procedure: CYSTOSCOPY WITH RETROGRADE PYELOGRAM, URETEROSCOPY AND STENT REMOVAL;  Surgeon: Malen Gauze, MD;  Location: [facility];  Service: Urology;  Laterality: Right;   CYSTOSCOPY WITH STENT PLACEMENT Right 08/27/2021   Procedure: CYSTOSCOPY WITH STENT PLACEMENT;  Surgeon: Malen Gauze, MD;  Location: [facility];  Service: Urology;  Laterality: Right;   INGUINAL HERNIA REPAIR Left 02/26/2006   recurrent   INGUINAL HERNIA REPAIR Left    prior to 2007   KNEE ARTHROSCOPY WITH MEDIAL MENISECTOMY Left 12/04/2017   Procedure: KNEE ARTHROSCOPY WITH MEDIAL MENISCECTOMY AND CHONDROPLASTY;  Surgeon: Bjorn Pippin, MD;  Location: [facility];  Service: Orthopedics;  Laterality: Left;  NO BLOCK   POLYPECTOMY  03/05/2019   Procedure: POLYPECTOMY;  Surgeon: Corbin Ade, MD;  Location: [facility];  Service: Endoscopy;;   STONE EXTRACTION WITH BASKET Bilateral  08/27/2021   Procedure: STONE EXTRACTION WITH BASKET;  Surgeon: Malen Gauze, MD;  Location: [facility];  Service: Urology;  Laterality: Bilateral;   TOTAL ABDOMINAL HYSTERECTOMY W/ BILATERAL SALPINGOOPHORECTOMY  10/15/2006   TUBAL LIGATION      Home Medications:  Allergies as of 02/21/2023       Reactions   Codeine Itching   Can take vicodin   Propoxyphene N-acetaminophen Nausea Only   Darvocet        Medication List        Accurate as of February 21, 2023 11:45 AM. If you have any questions, ask your nurse or doctor.          acetaminophen 325 MG tablet Commonly known as: Tylenol Take 2 tablets (650 mg total) by mouth every 6 (six) hours as needed for moderate pain.   amLODipine 5 MG tablet Commonly known as: Norvasc Take 1 tablet (5 mg total) by mouth daily.   Centrum Silver 50+Women Tabs Take 1 ddaily What changed:  how much to take how to take this when to take this additional instructions   hydrochlorothiazide 12.5 MG capsule Commonly known as: MICROZIDE Take 1 capsule (12.5 mg total) by mouth daily.   nitrofurantoin (macrocrystal-monohydrate) 100 MG capsule Commonly known as: MACROBID Take 1 capsule (100 mg total) by mouth 2 (two) times daily.   pantoprazole 20 MG tablet Commonly known as: Protonix Take 1 tablet (20 mg total) by mouth daily.   sertraline 50 MG tablet Commonly known as: ZOLOFT TAKE ONE (1) TABLET BY MOUTH EVERY DAY  Allergies:  Allergies  Allergen Reactions   Codeine Itching    Can take vicodin   Propoxyphene N-Acetaminophen Nausea Only    Darvocet    Family History: Family History  Problem Relation Age of Onset   Hypertension Brother    Kidney failure Brother        had kidney transplant   Hyperlipidemia Brother    Hypertension Sister    Hyperlipidemia Sister    Cancer Sister        cervical   Hypertension Brother    Kidney failure Father    Cancer Mother    Colon cancer Neg Hx     Social  History:  reports that she has never smoked. She has never used smokeless tobacco. She reports that she does not drink alcohol and does not use drugs.  ROS: All other review of systems were reviewed and are negative except what is noted above in HPI  Physical Exam: BP 132/76   Pulse 62   Constitutional:  Alert and oriented, No acute distress. HEENT: [place] AT, moist mucus membranes.  Trachea midline, no masses. Cardiovascular: No clubbing, cyanosis, or edema. Respiratory: Normal respiratory effort, no increased work of breathing. GI: Abdomen is soft, nontender, nondistended, no abdominal masses GU: No CVA tenderness.  Lymph: No cervical or inguinal lymphadenopathy. Skin: No rashes, bruises or suspicious lesions. Neurologic: Grossly intact, no focal deficits, moving all 4 extremities. Psychiatric: Normal mood and affect.  Laboratory Data: Lab Results  Component Value Date   WBC 6.0 06/27/2022   HGB 13.4 06/27/2022   HCT 40.3 06/27/2022   MCV 86 06/27/2022   PLT 236 06/27/2022    Lab Results  Component Value Date   CREATININE 0.86 06/27/2022    No results found for: "PSA"  No results found for: "TESTOSTERONE"  No results found for: "HGBA1C"  Urinalysis    Component Value Date/Time   COLORURINE AMBER (A) 05/09/2018 0949   APPEARANCEUR Clear 02/19/2023 1107   LABSPEC 1.014 05/09/2018 0949   PHURINE 5.0 05/09/2018 0949   GLUCOSEU Negative 02/19/2023 1107   HGBUR SMALL (A) 05/09/2018 0949   BILIRUBINUR Negative 02/19/2023 1107   KETONESUR negative 02/17/2023 1909   KETONESUR NEGATIVE 05/09/2018 0949   PROTEINUR Negative 02/19/2023 1107   PROTEINUR NEGATIVE 05/09/2018 0949   UROBILINOGEN 0.2 02/17/2023 1909   UROBILINOGEN 0.2 01/29/2015 2352   NITRITE Negative 02/19/2023 1107   NITRITE POSITIVE (A) 05/09/2018 0949   LEUKOCYTESUR 2+ (A) 02/19/2023 1107    Lab Results  Component Value Date   LABMICR See below: 02/19/2023   WBCUA 11-30 (A) 02/19/2023   RBCUA 0-2  12/17/2016   LABEPIT >10 (A) 02/19/2023   MUCUS Present 05/31/2019   BACTERIA Moderate (A) 02/19/2023    Pertinent Imaging: *** No results found for this or any previous visit.  No results found for this or any previous visit.  No results found for this or any previous visit.  No results found for this or any previous visit.  Results for orders placed during the hospital encounter of 05/01/22  Ultrasound renal complete  Narrative CLINICAL DATA:  Nephrolithiasis  EXAM: RENAL / URINARY TRACT ULTRASOUND COMPLETE  COMPARISON:  CT abdomen and pelvis 02/15/2022  FINDINGS: Right Kidney:  Renal measurements: 8.3 x 3.8 x 4.3 cm = volume: 70 mL. Cortical thinning. Normal cortical echogenicity. No mass, hydronephrosis, or shadowing calcification.  Left Kidney:  Renal measurements: 9.3 x 5.2 x 4.7 cm = volume: 118 mL. Normal cortical thickness and echogenicity. No mass,  hydronephrosis or shadowing calcification.  Bladder:  Appears normal for degree of bladder distention.  Other:  N/A  IMPRESSION: No evidence of renal mass or hydronephrosis.  No definite renal sonographic abnormalities identified.   Electronically Signed By: Ulyses Southward M.D. On: 05/01/2022 15:48  No valid procedures specified. No results found for this or any previous visit.  Results for orders placed in visit on 02/19/23  CT RENAL STONE STUDY  Narrative CLINICAL DATA:  Left flank pain  EXAM: CT ABDOMEN AND PELVIS WITHOUT CONTRAST  TECHNIQUE: Multidetector CT imaging of the abdomen and pelvis was performed following the standard protocol without IV contrast.  RADIATION DOSE REDUCTION: This exam was performed according to the departmental dose-optimization program which includes automated exposure control, adjustment of the mA and/or kV according to patient size and/or use of iterative reconstruction technique.  COMPARISON:  CT abdomen and pelvis 02/15/2022  FINDINGS: Lower chest:  No acute abnormality.  Hepatobiliary: No focal liver abnormality is seen. No gallstones, gallbladder wall thickening, or biliary dilatation.  Pancreas: Unremarkable. No pancreatic ductal dilatation or surrounding inflammatory changes.  Spleen: Normal in size without focal abnormality.  Adrenals/Urinary Tract: There are punctate bilateral renal calculi. There is no hydronephrosis. No calculi are seen in the bladder or ureters. Adrenal glands are within normal limits.  Stomach/Bowel: Stomach is within normal limits. Appendix appears normal. No evidence of bowel wall thickening, distention, or inflammatory changes. There is sigmoid colon diverticulosis.  Vascular/Lymphatic: Aortic atherosclerosis. No enlarged abdominal or pelvic lymph nodes.  Reproductive: Status post hysterectomy. No adnexal masses.  Other: No abdominal wall hernia or abnormality. No abdominopelvic ascites.  Musculoskeletal: No acute or significant osseous findings.  IMPRESSION: 1. Nonobstructing bilateral renal calculi. 2. Sigmoid colon diverticulosis without evidence for diverticulitis. 3. Aortic atherosclerosis.  Aortic Atherosclerosis (ICD10-I70.0).   Electronically Signed By: Darliss Cheney M.D. On: 02/19/2023 22:08   Assessment & Plan:    1. History of kidney stones ***  2. Back pain -we will trial flexeril  No follow-ups on file.  Wilkie Aye, MD  [facility] [place]

## 2023-02-25 ENCOUNTER — Encounter: Payer: Self-pay | Admitting: Urology

## 2023-02-25 NOTE — Patient Instructions (Signed)
Acute Back Pain, Adult Acute back pain is sudden and usually short-lived. It is often caused by an injury to the muscles and tissues in the back. The injury may result from: A muscle, tendon, or ligament getting overstretched or torn. Ligaments are tissues that connect bones to each other. Lifting something improperly can cause a back strain. Wear and tear (degeneration) of the spinal disks. Spinal disks are circular tissue that provide cushioning between the bones of the spine (vertebrae). Twisting motions, such as while playing sports or doing yard work. A hit to the back. Arthritis. You may have a physical exam, lab tests, and imaging tests to find the cause of your pain. Acute back pain usually goes away with rest and home care. Follow these instructions at home: Managing pain, stiffness, and swelling Take over-the-counter and prescription medicines only as told by your health care provider. Treatment may include medicines for pain and inflammation that are taken by mouth or applied to the skin, or muscle relaxants. Your health care provider may recommend applying ice during the first 24-48 hours after your pain starts. To do this: Put ice in a plastic bag. Place a towel between your skin and the bag. Leave the ice on for 20 minutes, 2-3 times a day. Remove the ice if your skin turns bright red. This is very important. If you cannot feel pain, heat, or cold, you have a greater risk of damage to the area. If directed, apply heat to the affected area as often as told by your health care provider. Use the heat source that your health care provider recommends, such as a moist heat pack or a heating pad. Place a towel between your skin and the heat source. Leave the heat on for 20-30 minutes. Remove the heat if your skin turns bright red. This is especially important if you are unable to feel pain, heat, or cold. You have a greater risk of getting burned. Activity  Do not stay in bed. Staying in  bed for more than 1-2 days can delay your recovery. Sit up and stand up straight. Avoid leaning forward when you sit or hunching over when you stand. If you work at a desk, sit close to it so you do not need to lean over. Keep your chin tucked in. Keep your neck drawn back, and keep your elbows bent at a 90-degree angle (right angle). Sit high and close to the steering wheel when you drive. Add lower back (lumbar) support to your car seat, if needed. Take short walks on even surfaces as soon as you are able. Try to increase the length of time you walk each day. Do not sit, drive, or stand in one place for more than 30 minutes at a time. Sitting or standing for long periods of time can put stress on your back. Do not drive or use heavy machinery while taking prescription pain medicine. Use proper lifting techniques. When you bend and lift, use positions that put less stress on your back: [place] your knees. Keep the load close to your body. Avoid twisting. Exercise regularly as told by your health care provider. Exercising helps your back heal faster and helps prevent back injuries by keeping muscles strong and flexible. Work with a physical therapist to make a safe exercise program, as recommended by your health care provider. Do any exercises as told by your physical therapist. Lifestyle Maintain a healthy weight. Extra weight puts stress on your back and makes it difficult to have good  posture. Avoid activities or situations that make you feel anxious or stressed. Stress and anxiety increase muscle tension and can make back pain worse. Learn ways to manage anxiety and stress, such as through exercise. General instructions Sleep on a firm mattress in a comfortable position. Try lying on your side with your knees slightly bent. If you lie on your back, put a pillow under your knees. Keep your head and neck in a straight line with your spine (neutral position) when using electronic equipment like  smartphones or pads. To do this: Raise your smartphone or pad to look at it instead of bending your head or neck to look down. Put the smartphone or pad at the level of your face while looking at the screen. Follow your treatment plan as told by your health care provider. This may include: Cognitive or behavioral therapy. Acupuncture or massage therapy. Meditation or yoga. Contact a health care provider if: You have pain that is not relieved with rest or medicine. You have increasing pain going down into your legs or buttocks. Your pain does not improve after 2 weeks. You have pain at night. You lose weight without trying. You have a fever or chills. You develop nausea or vomiting. You develop abdominal pain. Get help right away if: You develop new bowel or bladder control problems. You have unusual weakness or numbness in your arms or legs. You feel faint. These symptoms may represent a serious problem that is an emergency. Do not wait to see if the symptoms will go away. Get medical help right away. Call your local emergency services (911 in the [place] Do not drive yourself to the hospital. Summary Acute back pain is sudden and usually short-lived. Use proper lifting techniques. When you bend and lift, use positions that put less stress on your back. Take over-the-counter and prescription medicines only as told by your health care provider, and apply heat or ice as told. This information is not intended to replace advice given to you by your health care provider. Make sure you discuss any questions you have with your health care provider. Document Revised: 08/18/2020 Document Reviewed: 08/18/2020 Elsevier Patient Education  2024 [employer].

## 2023-02-25 NOTE — Patient Instructions (Signed)

## 2023-05-02 ENCOUNTER — Ambulatory Visit: Payer: BC Managed Care – PPO | Admitting: Urology

## 2023-07-05 ENCOUNTER — Other Ambulatory Visit: Payer: Self-pay | Admitting: Adult Health

## 2023-07-14 ENCOUNTER — Encounter: Payer: Self-pay | Admitting: Adult Health

## 2023-07-14 ENCOUNTER — Ambulatory Visit (INDEPENDENT_AMBULATORY_CARE_PROVIDER_SITE_OTHER): Payer: BC Managed Care – PPO | Admitting: Adult Health

## 2023-07-14 VITALS — BP 146/78 | HR 59 | Ht 62.0 in | Wt 186.0 lb

## 2023-07-14 DIAGNOSIS — Z1331 Encounter for screening for depression: Secondary | ICD-10-CM | POA: Diagnosis not present

## 2023-07-14 DIAGNOSIS — Z13220 Encounter for screening for lipoid disorders: Secondary | ICD-10-CM

## 2023-07-14 DIAGNOSIS — Z90710 Acquired absence of both cervix and uterus: Secondary | ICD-10-CM

## 2023-07-14 DIAGNOSIS — Z01419 Encounter for gynecological examination (general) (routine) without abnormal findings: Secondary | ICD-10-CM

## 2023-07-14 DIAGNOSIS — F32A Depression, unspecified: Secondary | ICD-10-CM

## 2023-07-14 DIAGNOSIS — I10 Essential (primary) hypertension: Secondary | ICD-10-CM

## 2023-07-14 DIAGNOSIS — Z1211 Encounter for screening for malignant neoplasm of colon: Secondary | ICD-10-CM | POA: Diagnosis not present

## 2023-07-14 LAB — HEMOCCULT GUIAC POC 1CARD (OFFICE): Fecal Occult Blood, POC: NEGATIVE

## 2023-07-14 MED ORDER — AMLODIPINE BESYLATE 5 MG PO TABS: 5.0000 mg | ORAL_TABLET | Freq: Every day | ORAL | 12 refills | Status: DC

## 2023-07-14 NOTE — Progress Notes (Signed)
Patient ID: Shelby Guerra, female   DOB: [birth year], 68 y.o.   MRN: [id number] History of Present Illness: Shelby Guerra is a 68 year old black female,single, sp hysterectomy in for a well woman gyn exam. She lost rx for norvasc, so has not taken in over a week.  She is still working.  Current Medications, Allergies, Past Medical History, Past Surgical History, Family History and Social History were reviewed in [employer] record.     Review of Systems: Patient denies any headaches, hearing loss, fatigue, blurred vision, shortness of breath, chest pain, abdominal pain, problems with bowel movements, urination, or intercourse(not active). No joint pain or mood swings.     Physical Exam:BP (!) 146/78 (BP Location: Right Arm, Patient Position: Sitting, Cuff Size: Normal)   Pulse (!) 59   Ht 5\' 2"  (1.575 m)   Wt 186 lb (84.4 kg)   BMI 34.02 kg/m   General:  Well developed, well nourished, no acute distress Skin:  Warm and dry Neck:  Midline trachea, normal thyroid, good ROM, no lymphadenopathy,no carotid bruits heard Lungs; Clear to auscultation bilaterally Breast:  No dominant palpable mass, retraction, or nipple discharge,left nipple inverted, longstanding Cardiovascular: Regular rate and rhythm Abdomen:  Soft, non tender, no hepatosplenomegaly Pelvic:  External genitalia is normal in appearance, no lesions.  The vagina is pale. Urethra has no lesions or masses. The cervix and uterus are absent.  No adnexal masses or tenderness noted.Bladder is non tender, no masses felt. Rectal: Good sphincter tone, no polyps, or hemorrhoids felt.  Hemoccult negative. Extremities/musculoskeletal:  No swelling or varicosities noted, no clubbing or cyanosis Psych:  No mood changes, alert and cooperative,seems happy AA is 0 Fall risk is low    07/14/2023    8:41 AM 06/27/2022    8:41 AM 06/12/2021    8:39 AM  Depression screen PHQ 2/9  Decreased Interest 2 0 3  Down, Depressed,  Hopeless 0 1 0  PHQ - 2 Score 2 1 3   Altered sleeping 0 0 2  Tired, decreased energy 1 2 1   Change in appetite 0 0 0  Feeling bad or failure about yourself  0 0 0  Trouble concentrating 0 0 1  Moving slowly or fidgety/restless 0 0 0  Suicidal thoughts 0 0 0  PHQ-9 Score 3 3 7        07/14/2023    8:42 AM 06/27/2022    8:41 AM 06/12/2021    8:39 AM 05/18/2020    8:39 AM  GAD 7 : Generalized Anxiety Score  Nervous, Anxious, on Edge 0 1 0 0  Control/stop worrying 0 1 0 0  Worry too much - different things 0 2 0 0  Trouble relaxing 0 0 0 0  Restless 0 0 0 0  Easily annoyed or irritable 0 1 0 0  Afraid - awful might happen 0 2 0 0  Total GAD 7 Score 0 7 0 0    Examination chaperoned by Malachy Mood LPN   Impression and Plan: 1. Encounter for well woman exam with routine gynecological exam (Primary) Physical in 1 year Will check fasting labs in am - CBC - Comprehensive metabolic panel - Lipid panel Mammogram was negative 02/21/23 Colonoscopy per GI   2. Encounter for screening fecal occult blood testing Hemoccult was negative  - POCT occult blood stool  3. Hypertension, unspecified type Will refill Norvasc 5 mg 1 daily  Continue Microzide 12.5 mg 1 daily, has refills  Follow up in  6 months  4. S/P hysterectomy   5. Screening cholesterol level - Lipid panel  6. Depression, unspecified depression type On Zoloft 50 mg 1 daily, has refills

## 2023-07-16 LAB — COMPREHENSIVE METABOLIC PANEL
ALT: 9 [IU]/L (ref 0–32)
AST: 13 [IU]/L (ref 0–40)
Albumin: 4.2 g/dL (ref 3.9–4.9)
Alkaline Phosphatase: 93 [IU]/L (ref 44–121)
BUN/Creatinine Ratio: 31 — ABNORMAL HIGH (ref 12–28)
BUN: 28 mg/dL — ABNORMAL HIGH (ref 8–27)
Bilirubin Total: <0.2 mg/dL (ref 0.0–1.2)
CO2: 24 mmol/L (ref 20–29)
Calcium: 9.4 mg/dL (ref 8.7–10.3)
Chloride: 101 mmol/L (ref 96–106)
Creatinine, Ser: 0.91 mg/dL (ref 0.57–1.00)
Globulin, Total: 2.4 g/dL (ref 1.5–4.5)
Glucose: 85 mg/dL (ref 70–99)
Potassium: 3.8 mmol/L (ref 3.5–5.2)
Sodium: 140 mmol/L (ref 134–144)
Total Protein: 6.6 g/dL (ref 6.0–8.5)
eGFR: 70 mL/min/{1.73_m2} (ref >59)

## 2023-07-16 LAB — CBC
Hematocrit: 38.2 % (ref 34.0–46.6)
Hemoglobin: 12.5 g/dL (ref 11.1–15.9)
MCH: 28.9 pg (ref 26.6–33.0)
MCHC: 32.7 g/dL (ref 31.5–35.7)
MCV: 88 fL (ref 79–97)
Platelets: 207 10*3/uL (ref 150–450)
RBC: 4.32 x10E6/uL (ref 3.77–5.28)
RDW: 13.1 % (ref 11.7–15.4)
WBC: 6 10*3/uL (ref 3.4–10.8)

## 2023-07-16 LAB — LIPID PANEL
Chol/HDL Ratio: 3.9 {ratio} (ref 0.0–4.4)
Cholesterol, Total: 197 mg/dL (ref 100–199)
HDL: 50 mg/dL (ref >39)
LDL Chol Calc (NIH): 132 mg/dL — ABNORMAL HIGH (ref 0–99)
Triglycerides: 84 mg/dL (ref 0–149)
VLDL Cholesterol Cal: 15 mg/dL (ref 5–40)

## 2023-08-07 ENCOUNTER — Other Ambulatory Visit: Payer: Self-pay | Admitting: Adult Health

## 2023-08-21 ENCOUNTER — Other Ambulatory Visit: Payer: Self-pay | Admitting: Adult Health

## 2023-09-20 ENCOUNTER — Other Ambulatory Visit: Payer: Self-pay | Admitting: Adult Health

## 2023-10-31 ENCOUNTER — Ambulatory Visit
Admission: EM | Admit: 2023-10-31 | Discharge: 2023-10-31 | Disposition: A | Discharge status: Home / Self Care | Attending: Nurse Practitioner | Admitting: Nurse Practitioner

## 2023-10-31 DIAGNOSIS — R350 Frequency of micturition: Secondary | ICD-10-CM | POA: Diagnosis present

## 2023-10-31 DIAGNOSIS — Z87442 Personal history of urinary calculi: Secondary | ICD-10-CM | POA: Diagnosis present

## 2023-10-31 DIAGNOSIS — R109 Unspecified abdominal pain: Secondary | ICD-10-CM | POA: Insufficient documentation

## 2023-10-31 LAB — POCT URINALYSIS DIP (MANUAL ENTRY)
Bilirubin, UA: NEGATIVE
Glucose, UA: NEGATIVE mg/dL
Ketones, POC UA: NEGATIVE mg/dL
Leukocytes, UA: NEGATIVE
Nitrite, UA: NEGATIVE
Protein Ur, POC: NEGATIVE mg/dL
Spec Grav, UA: 1.02 (ref 1.010–1.025)
Urobilinogen, UA: 0.2 U/dL
pH, UA: 7 (ref 5.0–8.0)

## 2023-10-31 MED ORDER — TAMSULOSIN HCL 0.4 MG PO CAPS: 0.4000 mg | ORAL_CAPSULE | Freq: Every day | ORAL | 0 refills | Status: DC

## 2023-10-31 NOTE — Discharge Instructions (Addendum)
 A urine culture has been ordered.  You will be contacted if the pending test result is abnormal.  You will also have access to your results via MyChart. Take medication as prescribed. You may take over-the-counter Tylenol  or ibuprofen  as needed for pain, fever, or general discomfort. Make sure you are drinking at least 8-10 8 ounce glasses of water  daily while symptoms persist. Develop a toileting schedule that will allow you to urinate at least every 2 hours. As discussed, if you develop worsening pain or other symptoms, please follow-up in the emergency department for further evaluation. Recommend following up with Dr. Claretta Croft for reevaluation. Follow-up as needed.

## 2023-10-31 NOTE — ED Provider Notes (Signed)
 [facility]    CSN: [id number] Arrival date & time: 10/31/23  1420      History   Chief Complaint No chief complaint on file.   HPI Shelby Guerra is a 68 y.o. female.   The history is provided by the patient.   Patient presents with a 1 day history of urinary frequency, low back pain and left flank pain.  Patient denies fever, chills, dysuria, hematuria, decreased urine stream, urinary urgency, vaginal symptoms.  Per review of the patient's chart, patient with history of nephrolithiasis, patient confirms the same.  States she has not seen urology since last year.  Patient has not taken any medication for her symptoms.  Past Medical History:  Diagnosis Date   GERD (gastroesophageal reflux disease)    High cholesterol    History of kidney stones    Hot flashes 03/10/2013   Hypertension    pt denies   Impaired fasting glucose    Moody 03/14/2014    Patient Active Problem List   Diagnosis Date Noted   Hypertension 06/27/2022   S/P hysterectomy 06/12/2021   Screening cholesterol level 06/12/2021   Encounter for screening fecal occult blood testing 05/18/2020   Hematuria 05/31/2019   Dysuria 05/31/2019   History of kidney stones 05/31/2019   Encounter for well woman exam with routine gynecological exam 05/10/2019   Urinary frequency 05/10/2019   Screening for colorectal cancer 05/05/2018   Left knee pain 05/22/2017   Well female exam with routine gynecological exam 05/22/2017   Depression 02/01/2015   Moody 03/14/2014   Hot flashes 03/10/2013   GERD 11/29/2009   CONSTIPATION, CHRONIC 11/29/2009   NAUSEA, CHRONIC 11/29/2009    Past Surgical History:  Procedure Laterality Date   COLONOSCOPY  12/13/2008   COLONOSCOPY N/A 03/05/2019   Procedure: COLONOSCOPY;  Surgeon: Suzette Espy, MD;  Location: [facility];  Service: Endoscopy;  Laterality: N/A;  10:30   CYSTOSCOPY WITH RETROGRADE PYELOGRAM, URETEROSCOPY AND STENT PLACEMENT Bilateral 08/27/2021    Procedure: BILATERAL RETROGRADE PYELOGRAM, BILATERAL URETEROSCOPY;  Surgeon: Marco Severs, MD;  Location: [facility];  Service: Urology;  Laterality: Bilateral;   CYSTOSCOPY WITH RETROGRADE PYELOGRAM, URETEROSCOPY AND STENT PLACEMENT Right 09/13/2021   Procedure: CYSTOSCOPY WITH RETROGRADE PYELOGRAM, URETEROSCOPY AND STENT REMOVAL;  Surgeon: Marco Severs, MD;  Location: [facility];  Service: Urology;  Laterality: Right;   CYSTOSCOPY WITH STENT PLACEMENT Right 08/27/2021   Procedure: CYSTOSCOPY WITH STENT PLACEMENT;  Surgeon: Marco Severs, MD;  Location: [facility];  Service: Urology;  Laterality: Right;   INGUINAL HERNIA REPAIR Left 02/26/2006   recurrent   INGUINAL HERNIA REPAIR Left    prior to 2007   KNEE ARTHROSCOPY WITH MEDIAL MENISECTOMY Left 12/04/2017   Procedure: KNEE ARTHROSCOPY WITH MEDIAL MENISCECTOMY AND CHONDROPLASTY;  Surgeon: Micheline Ahr, MD;  Location: [facility];  Service: Orthopedics;  Laterality: Left;  NO BLOCK   POLYPECTOMY  03/05/2019   Procedure: POLYPECTOMY;  Surgeon: Suzette Espy, MD;  Location: [facility];  Service: Endoscopy;;   STONE EXTRACTION WITH BASKET Bilateral 08/27/2021   Procedure: STONE EXTRACTION WITH BASKET;  Surgeon: Marco Severs, MD;  Location: [facility];  Service: Urology;  Laterality: Bilateral;   TOTAL ABDOMINAL HYSTERECTOMY W/ BILATERAL SALPINGOOPHORECTOMY  10/15/2006   TUBAL LIGATION      OB History     Gravida  1   Para  1   Term  1   Preterm      AB  Living  1      SAB      IAB      Ectopic      Multiple      Live Births  1            Home Medications    Prior to Admission medications   Medication Sig Start Date End Date Taking? Authorizing Provider  acetaminophen  (TYLENOL ) 325 MG tablet Take 2 tablets (650 mg total) by mouth every 6 (six) hours as needed for moderate pain. 01/17/22   Adolph Hoop, PA-C  amLODipine  (NORVASC ) 5 MG tablet Take 1 tablet (5 mg total) by mouth daily.  07/14/23   Javan Messing, NP  cyclobenzaprine  (FLEXERIL ) 5 MG tablet Take 1 tablet (5 mg total) by mouth 3 (three) times daily as needed for muscle spasms. 02/21/23   McKenzie, Arden Beck, MD  hydrochlorothiazide  (MICROZIDE ) 12.5 MG capsule TAKE 1 CAPSULE(12.5 MG) BY MOUTH DAILY 08/21/23   Javan Messing, NP  Multiple Vitamins-Minerals (CENTRUM SILVER  50+WOMEN) TABS Take 1 ddaily Patient taking differently: Take 1 tablet by mouth daily. 05/18/20   Javan Messing, NP  pantoprazole  (PROTONIX ) 20 MG tablet TAKE 1 TABLET(20 MG) BY MOUTH DAILY 08/07/23   Javan Messing, NP  sertraline  (ZOLOFT ) 50 MG tablet TAKE ONE (1) TABLET BY MOUTH EVERY DAY 09/22/23   Ferd Householder, CNM    Family History Family History  Problem Relation Age of Onset   Hypertension Brother    Kidney failure Brother        had kidney transplant   Hyperlipidemia Brother    Hypertension Sister    Hyperlipidemia Sister    Cancer Sister        cervical   Hypertension Brother    Kidney failure Father    Cancer Mother    Colon cancer Neg Hx     Social History Social History   Tobacco Use   Smoking status: Never   Smokeless tobacco: Never  Vaping Use   Vaping status: Never Used  Substance Use Topics   Alcohol use: No   Drug use: No     Allergies   Codeine and Propoxyphene n-acetaminophen    Review of Systems Review of Systems Per HPI  Physical Exam Triage Vital Signs ED Triage Vitals [10/31/23 1430]  Encounter Vitals Group     BP 134/76     Systolic BP Percentile      Diastolic BP Percentile      Pulse Rate (!) 54     Resp 20     Temp 98.6 F (37 C)     Temp Source Oral     SpO2 95 %     Weight      Height      Head Circumference      Peak Flow      Pain Score 2     Pain Loc      Pain Education      Exclude from Growth Chart    No data found.  Updated Vital Signs BP 134/76 (BP Location: Right Arm)   Pulse (!) 54   Temp 98.6 F (37 C) (Oral)   Resp 20   SpO2 95%    Visual Acuity Right Eye Distance:   Left Eye Distance:   Bilateral Distance:    Right Eye Near:   Left Eye Near:    Bilateral Near:     Physical Exam Vitals and nursing note reviewed.  Constitutional:  General: She is not in acute distress.    Appearance: Normal appearance.  HENT:     Head: Normocephalic.  Eyes:     Pupils: Pupils are equal, round, and reactive to light.  Cardiovascular:     Rate and Rhythm: Normal rate and regular rhythm.     Pulses: Normal pulses.     Heart sounds: Normal heart sounds.  Pulmonary:     Effort: Pulmonary effort is normal. No respiratory distress.     Breath sounds: Normal breath sounds. No stridor. No wheezing, rhonchi or rales.  Abdominal:     General: Bowel sounds are normal.     Palpations: Abdomen is soft.     Tenderness: There is no abdominal tenderness. There is right CVA tenderness and left CVA tenderness.  Musculoskeletal:     Cervical back: Normal range of motion.  Lymphadenopathy:     Cervical: No cervical adenopathy.  Skin:    General: Skin is warm and dry.  Neurological:     General: No focal deficit present.     Mental Status: She is alert and oriented to person, place, and time.  Psychiatric:        Mood and Affect: Mood normal.        Behavior: Behavior normal.      UC Treatments / Results  Labs (all labs ordered are listed, but only abnormal results are displayed) Labs Reviewed  POCT URINALYSIS DIP (MANUAL ENTRY) - Abnormal; Notable for the following components:      Result Value   Color, UA light yellow (*)    Blood, UA trace-intact (*)    All other components within normal limits  URINE CULTURE    EKG   Radiology No results found.  Procedures Procedures (including critical care time)  Medications Ordered in UC Medications - No data to display  Initial Impression / Assessment and Plan / UC Course  I have reviewed the triage vital signs and the nursing notes.  Pertinent labs & imaging  results that were available during my care of the patient were reviewed by me and considered in my medical decision making (see chart for details).  Urinalysis is positive for trace blood, no other abnormalities found.  Urine culture is pending.  On exam, patient with bilateral CVA tenderness.  Given her history of nephrolithiasis, symptoms are consistent with same.  Tamsulosin  0.4 mg prescribed.  Supportive care recommendations were provided and discussed with the patient to include fluids and over-the-counter analgesics.  Patient was advised it is recommended that she follow-up with her urologist for further evaluation.  Patient was in agreement with this plan of care and verbalizes understanding.  All questions were answered.  Patient stable for discharge.   Final Clinical Impressions(s) / UC Diagnoses   Final diagnoses:  Left flank pain   Discharge Instructions   None    ED Prescriptions   None    PDMP not reviewed this encounter.   Hardy Lia, NP 10/31/23 [phone]

## 2023-10-31 NOTE — ED Triage Notes (Signed)
 Pt reports frequent urination, lower back pain, started yesterday at work.

## 2023-11-01 LAB — URINE CULTURE: Culture: <10000 — AB

## 2023-11-03 ENCOUNTER — Ambulatory Visit (HOSPITAL_COMMUNITY): Payer: Self-pay

## 2023-11-04 ENCOUNTER — Telehealth: Payer: Self-pay | Admitting: Urology

## 2023-11-04 DIAGNOSIS — Z87442 Personal history of urinary calculi: Secondary | ICD-10-CM

## 2023-11-04 DIAGNOSIS — R1084 Generalized abdominal pain: Secondary | ICD-10-CM

## 2023-11-04 NOTE — Telephone Encounter (Signed)
 Left message she was seen at urgent care and has infection or kidney stones are moving. She needs a follow up

## 2023-11-04 NOTE — Telephone Encounter (Signed)
 Return call to patient, tried to offer patient a sooner appt with the NP. Patient state's she need something for pain and she voiced that she was given Flomax  but nothing for pain. Patient is aware a message will be sent to Dr. Claretta Croft. Patient voiced understanding.

## 2023-11-11 NOTE — Telephone Encounter (Signed)
 Patient was made aware and voiced understanding"She needs to be seen and have a CT stone study" Patient want's to know if MD can prescribe her something for pain until she has CT and appointment. Patient is aware a message will be sent to provider.

## 2023-11-11 NOTE — Addendum Note (Signed)
 Addended by: Buddie Carina R on: 11/11/2023 08:52 AM   Modules accepted: Orders

## 2023-11-15 ENCOUNTER — Encounter: Payer: Self-pay | Admitting: Emergency Medicine

## 2023-11-15 ENCOUNTER — Ambulatory Visit
Admission: EM | Admit: 2023-11-15 | Discharge: 2023-11-15 | Disposition: A | Discharge status: Home / Self Care | Attending: Nurse Practitioner | Admitting: Nurse Practitioner

## 2023-11-15 DIAGNOSIS — R399 Unspecified symptoms and signs involving the genitourinary system: Secondary | ICD-10-CM | POA: Insufficient documentation

## 2023-11-15 LAB — POCT URINALYSIS DIP (MANUAL ENTRY)
Bilirubin, UA: NEGATIVE
Glucose, UA: NEGATIVE mg/dL
Ketones, POC UA: NEGATIVE mg/dL
Nitrite, UA: NEGATIVE
Protein Ur, POC: NEGATIVE mg/dL
Spec Grav, UA: 1.02
Urobilinogen, UA: 0.2 U/dL
pH, UA: 6

## 2023-11-15 MED ORDER — NITROFURANTOIN MONOHYD MACRO 100 MG PO CAPS: 100.0000 mg | ORAL_CAPSULE | Freq: Two times a day (BID) | ORAL | 0 refills | Status: AC

## 2023-11-15 NOTE — ED Triage Notes (Signed)
Urinary frequency and pressure x 2 days

## 2023-11-15 NOTE — Discharge Instructions (Signed)
 A urine culture is pending. You will be contact if the results of the culture are negative, or if the medication prescribed today needs to be changed. You will also have access to your results via MyChart. -Take medications as prescribed. -Increase fluids. -Tylenol  for pain, fever, or general discomfort. -Develop a toileting schedule that will allow you to urinate at least every 2 hours. -Avoid caffeine to include tea, soda, and coffee. -If sexually active, void at least 15 to 20 minutes after sexual intercourse. -If the result of the culture is negative and you are continue to experience symptoms, please follow-up with the urologist for further evaluation. Follow-up as needed.

## 2023-11-15 NOTE — ED Provider Notes (Signed)
 [facility]    CSN: [id number] Arrival date & time: 11/15/23  1437      History   Chief Complaint No chief complaint on file.   HPI Shelby Guerra is a 68 y.o. female.   The history is provided by the patient.   Patient presents with a 1 day history of urinary frequency,urgency and lower abdominal pain.  She denies fever, chills, chest pain, dysuria, hematuria, flank pain, low back pain, or vaginal symptoms.  Patient with underlying history of kidney stones.  States she is waiting to get in with urology as she needs to have a scan done.  Patient states she is waiting on her insurance.  Past Medical History:  Diagnosis Date   GERD (gastroesophageal reflux disease)    High cholesterol    History of kidney stones    Hot flashes 03/10/2013   Hypertension    pt denies   Impaired fasting glucose    Moody 03/14/2014    Patient Active Problem List   Diagnosis Date Noted   Hypertension 06/27/2022   S/P hysterectomy 06/12/2021   Screening cholesterol level 06/12/2021   Encounter for screening fecal occult blood testing 05/18/2020   Hematuria 05/31/2019   Dysuria 05/31/2019   History of kidney stones 05/31/2019   Encounter for well woman exam with routine gynecological exam 05/10/2019   Urinary frequency 05/10/2019   Screening for colorectal cancer 05/05/2018   Left knee pain 05/22/2017   Well female exam with routine gynecological exam 05/22/2017   Depression 02/01/2015   Moody 03/14/2014   Hot flashes 03/10/2013   GERD 11/29/2009   CONSTIPATION, CHRONIC 11/29/2009   NAUSEA, CHRONIC 11/29/2009    Past Surgical History:  Procedure Laterality Date   COLONOSCOPY  12/13/2008   COLONOSCOPY N/A 03/05/2019   Procedure: COLONOSCOPY;  Surgeon: Suzette Espy, MD;  [facility]: [facility];  Service: Endoscopy;  Laterality: N/A;  10:30   CYSTOSCOPY WITH RETROGRADE PYELOGRAM, URETEROSCOPY AND STENT PLACEMENT Bilateral 08/27/2021   Procedure: BILATERAL RETROGRADE  PYELOGRAM, BILATERAL URETEROSCOPY;  Surgeon: Marco Severs, MD;  Location: [facility];  Service: Urology;  Laterality: Bilateral;   CYSTOSCOPY WITH RETROGRADE PYELOGRAM, URETEROSCOPY AND STENT PLACEMENT Right 09/13/2021   Procedure: CYSTOSCOPY WITH RETROGRADE PYELOGRAM, URETEROSCOPY AND STENT REMOVAL;  Surgeon: Marco Severs, MD;  Location: [facility];  Service: Urology;  Laterality: Right;   CYSTOSCOPY WITH STENT PLACEMENT Right 08/27/2021   Procedure: CYSTOSCOPY WITH STENT PLACEMENT;  Surgeon: Marco Severs, MD;  Location: [facility];  Service: Urology;  Laterality: Right;   INGUINAL HERNIA REPAIR Left 02/26/2006   recurrent   INGUINAL HERNIA REPAIR Left    prior to 2007   KNEE ARTHROSCOPY WITH MEDIAL MENISECTOMY Left 12/04/2017   Procedure: KNEE ARTHROSCOPY WITH MEDIAL MENISCECTOMY AND CHONDROPLASTY;  Surgeon: Micheline Ahr, MD;  Location: [facility];  Service: Orthopedics;  Laterality: Left;  NO BLOCK   POLYPECTOMY  03/05/2019   Procedure: POLYPECTOMY;  Surgeon: Suzette Espy, MD;  [facility];  Service: Endoscopy;;   STONE EXTRACTION WITH BASKET Bilateral 08/27/2021   Procedure: STONE EXTRACTION WITH BASKET;  Surgeon: Marco Severs, MD;  Location: [facility];  Service: Urology;  Laterality: Bilateral;   TOTAL ABDOMINAL HYSTERECTOMY W/ BILATERAL SALPINGOOPHORECTOMY  10/15/2006   TUBAL LIGATION      OB History     Gravida  1   Para  1   Term  1   Preterm      AB  Living  1      SAB      IAB      Ectopic      Multiple      Live Births  1            Home Medications    Prior to Admission medications   Medication Sig Start Date End Date Taking? Authorizing Provider  nitrofurantoin , macrocrystal-monohydrate, (MACROBID ) 100 MG capsule Take 1 capsule (100 mg total) by mouth 2 (two) times daily for 7 days. 11/15/23 11/22/23 Yes Leath-Warren, Belen Bowers, NP  acetaminophen  (TYLENOL ) 325 MG tablet Take 2 tablets (650 mg total) by mouth  every 6 (six) hours as needed for moderate pain. 01/17/22   Adolph Hoop, PA-C  amLODipine  (NORVASC ) 5 MG tablet Take 1 tablet (5 mg total) by mouth daily. 07/14/23   Javan Messing, NP  cyclobenzaprine  (FLEXERIL ) 5 MG tablet Take 1 tablet (5 mg total) by mouth 3 (three) times daily as needed for muscle spasms. 02/21/23   McKenzie, Arden Beck, MD  hydrochlorothiazide  (MICROZIDE ) 12.5 MG capsule TAKE 1 CAPSULE(12.5 MG) BY MOUTH DAILY 08/21/23   Javan Messing, NP  Multiple Vitamins-Minerals (CENTRUM SILVER  50+WOMEN) TABS Take 1 ddaily Patient taking differently: Take 1 tablet by mouth daily. 05/18/20   Javan Messing, NP  pantoprazole  (PROTONIX ) 20 MG tablet TAKE 1 TABLET(20 MG) BY MOUTH DAILY 08/07/23   Lendia Quay A, NP  sertraline  (ZOLOFT ) 50 MG tablet TAKE ONE (1) TABLET BY MOUTH EVERY DAY 09/22/23   Ferd Householder, CNM  tamsulosin  (FLOMAX ) 0.4 MG CAPS capsule Take 1 capsule (0.4 mg total) by mouth daily after supper. 10/31/23   Leath-Warren, Belen Bowers, NP    Family History Family History  Problem Relation Age of Onset   Hypertension Brother    Kidney failure Brother        had kidney transplant   Hyperlipidemia Brother    Hypertension Sister    Hyperlipidemia Sister    Cancer Sister        cervical   Hypertension Brother    Kidney failure Father    Cancer Mother    Colon cancer Neg Hx     Social History Social History   Tobacco Use   Smoking status: Never   Smokeless tobacco: Never  Vaping Use   Vaping status: Never Used  Substance Use Topics   Alcohol use: No   Drug use: No     Allergies   Codeine and Propoxyphene n-acetaminophen    Review of Systems Review of Systems Per HPI  Physical Exam Triage Vital Signs ED Triage Vitals  Encounter Vitals Group     BP 11/15/23 1445 (!) 152/88     Systolic BP Percentile --      Diastolic BP Percentile --      Pulse Rate 11/15/23 1445 61     Resp 11/15/23 1445 18     Temp 11/15/23 1445 98.4 F (36.9 C)      Temp Source 11/15/23 1445 Oral     SpO2 11/15/23 1445 95 %     Weight --      Height --      Head Circumference --      Peak Flow --      Pain Score 11/15/23 1446 6     Pain Loc --      Pain Education --      Exclude from Growth Chart --    No data found.  Updated Vital Signs BP Aaron Aas)  152/88 (BP Location: Right Arm)   Pulse 61   Temp 98.4 F (36.9 C) (Oral)   Resp 18   SpO2 95%   Visual Acuity Right Eye Distance:   Left Eye Distance:   Bilateral Distance:    Right Eye Near:   Left Eye Near:    Bilateral Near:     Physical Exam Vitals and nursing note reviewed.  Constitutional:      General: She is not in acute distress.    Appearance: Normal appearance.  HENT:     Head: Normocephalic.  Eyes:     Extraocular Movements: Extraocular movements intact.     Pupils: Pupils are equal, round, and reactive to light.  Cardiovascular:     Rate and Rhythm: Normal rate and regular rhythm.     Pulses: Normal pulses.     Heart sounds: Normal heart sounds.  Pulmonary:     Effort: Pulmonary effort is normal. No respiratory distress.     Breath sounds: Normal breath sounds. No stridor. No wheezing, rhonchi or rales.  Abdominal:     General: Bowel sounds are normal.     Palpations: Abdomen is soft.     Tenderness: There is abdominal tenderness in the suprapubic area. There is no right CVA tenderness or left CVA tenderness.  Musculoskeletal:     Cervical back: Normal range of motion.  Lymphadenopathy:     Cervical: No cervical adenopathy.  Skin:    General: Skin is warm and dry.  Neurological:     General: No focal deficit present.     Mental Status: She is alert and oriented to person, place, and time.  Psychiatric:        Mood and Affect: Mood normal.        Behavior: Behavior normal.      UC Treatments / Results  Labs (all labs ordered are listed, but only abnormal results are displayed) Labs Reviewed  POCT URINALYSIS DIP (MANUAL ENTRY) - Abnormal; Notable for  the following components:      Result Value   Blood, UA trace-intact (*)    Leukocytes, UA Trace (*)    All other components within normal limits  URINE CULTURE    EKG   Radiology No results found.  Procedures Procedures (including critical care time)  Medications Ordered in UC Medications - No data to display  Initial Impression / Assessment and Plan / UC Course  I have reviewed the triage vital signs and the nursing notes.  Pertinent labs & imaging results that were available during my care of the patient were reviewed by me and considered in my medical decision making (see chart for details).  Urinalysis positive for leukocytes and blood.  Urine culture is pending.  Patient with underlying history of kidney stones.  In the interim, empiric treatment provided with Macrobid  100 mg.  Patient was advised if the results of the culture are negative, and she is still experiencing symptoms, recommend that she follow-up with urology for further evaluation.  Supportive care recommendations were provided and discussed with the patient to include Tylenol  for pain or discomfort, avoiding caffeine, and developing a toileting schedule.  Patient was in agreement with this plan of care and verbalizes understanding.  Questions were answered.  Patient stable for discharge.   Final Clinical Impressions(s) / UC Diagnoses   Final diagnoses:  UTI symptoms     Discharge Instructions      A urine culture is pending. You will be contact if the results of  the culture are negative, or if the medication prescribed today needs to be changed. You will also have access to your results via MyChart. -Take medications as prescribed. -Increase fluids. -Tylenol  for pain, fever, or general discomfort. -Develop a toileting schedule that will allow you to urinate at least every 2 hours. -Avoid caffeine to include tea, soda, and coffee. -If sexually active, void at least 15 to 20 minutes after sexual  intercourse. -If the result of the culture is negative and you are continue to experience symptoms, please follow-up with the urologist for further evaluation. Follow-up as needed.   ED Prescriptions     Medication Sig Dispense Auth. Provider   nitrofurantoin , macrocrystal-monohydrate, (MACROBID ) 100 MG capsule Take 1 capsule (100 mg total) by mouth 2 (two) times daily for 7 days. 14 capsule Leath-Warren, Belen Bowers, NP      PDMP not reviewed this encounter.   Hardy Lia, NP 11/15/23 [phone]

## 2023-11-16 LAB — URINE CULTURE: Culture: NO GROWTH

## 2023-11-17 ENCOUNTER — Ambulatory Visit (HOSPITAL_COMMUNITY): Payer: Self-pay

## 2023-12-19 ENCOUNTER — Other Ambulatory Visit: Payer: Self-pay | Admitting: Adult Health

## 2023-12-22 ENCOUNTER — Ambulatory Visit (HOSPITAL_COMMUNITY)

## 2024-01-05 ENCOUNTER — Encounter: Payer: Self-pay | Admitting: *Deleted

## 2024-01-09 ENCOUNTER — Ambulatory Visit (INDEPENDENT_AMBULATORY_CARE_PROVIDER_SITE_OTHER): Payer: Self-pay | Admitting: Family Medicine

## 2024-01-09 ENCOUNTER — Encounter: Payer: Self-pay | Admitting: Family Medicine

## 2024-01-09 VITALS — BP 140/80 | HR 54 | Resp 16 | Ht 62.0 in | Wt 184.0 lb

## 2024-01-09 DIAGNOSIS — E038 Other specified hypothyroidism: Secondary | ICD-10-CM

## 2024-01-09 DIAGNOSIS — R7301 Impaired fasting glucose: Secondary | ICD-10-CM

## 2024-01-09 DIAGNOSIS — Z13820 Encounter for screening for osteoporosis: Secondary | ICD-10-CM

## 2024-01-09 DIAGNOSIS — Z1159 Encounter for screening for other viral diseases: Secondary | ICD-10-CM

## 2024-01-09 DIAGNOSIS — G8929 Other chronic pain: Secondary | ICD-10-CM

## 2024-01-09 DIAGNOSIS — E559 Vitamin D deficiency, unspecified: Secondary | ICD-10-CM

## 2024-01-09 DIAGNOSIS — I10 Essential (primary) hypertension: Secondary | ICD-10-CM | POA: Diagnosis not present

## 2024-01-09 DIAGNOSIS — Z114 Encounter for screening for human immunodeficiency virus [HIV]: Secondary | ICD-10-CM

## 2024-01-09 DIAGNOSIS — E7849 Other hyperlipidemia: Secondary | ICD-10-CM

## 2024-01-09 DIAGNOSIS — M25562 Pain in left knee: Secondary | ICD-10-CM | POA: Diagnosis not present

## 2024-01-09 MED ORDER — NAPROXEN 500 MG PO TABS
500.0000 mg | ORAL_TABLET | Freq: Two times a day (BID) | ORAL | 1 refills | Status: DC | PRN
Start: 2024-01-09 — End: 2024-02-10

## 2024-01-09 NOTE — Patient Instructions (Addendum)
 I appreciate the opportunity to provide care to you today!    Follow up:  4 months  Labs: please stop by the lab today to get your blood drawn (CBC, CMP, TSH, Lipid profile, HgA1c, Vit D)  Screening: HIV and Hep C  Chronic left knee pain -Please stop by [facility] an X-ray of the left knee to assess for structural abnormalities. -Start naproxen  500 mg orally twice daily with food for pain and inflammation. -Avoid prolonged standing or repetitive stress on the affected knee. -Encourage supportive footwear and use of a cushioned mat at work, if possible. -Recommend icing the knee for 15-20 minutes, 2-3 times daily.   Please follow up if your symptoms worsen or fail to improve.   Attached with your AVS, you will find valuable resources for self-education. I highly recommend dedicating some time to thoroughly examine them.   Please continue to a heart-healthy diet and increase your physical activities. Try to exercise for at least five days a week.    It was a pleasure to see you and I look forward to continuing to work together on your health and well-being. Please do not hesitate to call the office if you need care or have questions about your care.  In case of emergency, please visit the Emergency Department for urgent care, or contact our clinic at [phone] to schedule an appointment. We're here to help you!   Have a wonderful day and week. With Gratitude, Gentle Hoge MSN, FNP-BC

## 2024-01-09 NOTE — Progress Notes (Signed)
 New Patient Office Visit  Subjective:  Patient ID: Shelby Guerra, female    DOB: [birth year]  Age: 68 y.o. MRN: [id number]  CC:  Chief Complaint  Patient presents with   Establish Care   Knee Pain    Left knee pain from standing on concrete at work. Takes ibuprofen  for it. Rates pain 6/10    HPI Shelby Guerra is a 68 y.o. female with past medical history of Hypertension, GERD presents for establishing care.  The patient presents with complaints of left knee pain that has been occurring off and on for the past few months. She reports the pain worsens with rainy weather and during her work shifts, as she stands for prolonged periods (12-hour shifts, 6 days a week). She rates the pain as 7/10 at its worst. She denies any redness, swelling, tenderness, warmth, or recent injury. She takes over-the-counter Motrin  200 mg every 4 hours as needed, which helps ease the discomfort. No pain medication was taken yesterday.  She also has a history of hypertension and reports not taking her blood pressure medications today. Her current regimen includes amlodipine  5 mg and hydrochlorothiazide  12.5 mg daily. She is asymptomatic at the time of the visit.  Past Medical History:  Diagnosis Date   GERD (gastroesophageal reflux disease)    High cholesterol    History of kidney stones    Hot flashes 03/10/2013   Hypertension    pt denies   Impaired fasting glucose    Moody 03/14/2014    Past Surgical History:  Procedure Laterality Date   COLONOSCOPY  12/13/2008   COLONOSCOPY N/A 03/05/2019   Procedure: COLONOSCOPY;  Surgeon: Shaaron Lamar HERO, MD;  [facility];  Service: Endoscopy;  Laterality: N/A;  10:30   CYSTOSCOPY WITH RETROGRADE PYELOGRAM, URETEROSCOPY AND STENT PLACEMENT Bilateral 08/27/2021   Procedure: BILATERAL RETROGRADE PYELOGRAM, BILATERAL URETEROSCOPY;  Surgeon: Sherrilee Belvie LITTIE, MD;  Location: [facility];  Service: Urology;  Laterality: Bilateral;   CYSTOSCOPY WITH  RETROGRADE PYELOGRAM, URETEROSCOPY AND STENT PLACEMENT Right 09/13/2021   Procedure: CYSTOSCOPY WITH RETROGRADE PYELOGRAM, URETEROSCOPY AND STENT REMOVAL;  Surgeon: Sherrilee Belvie LITTIE, MD;  Location: [facility];  Service: Urology;  Laterality: Right;   CYSTOSCOPY WITH STENT PLACEMENT Right 08/27/2021   Procedure: CYSTOSCOPY WITH STENT PLACEMENT;  Surgeon: Sherrilee Belvie LITTIE, MD;  Location: [facility];  Service: Urology;  Laterality: Right;   INGUINAL HERNIA REPAIR Left 02/26/2006   recurrent   INGUINAL HERNIA REPAIR Left    prior to 2007   KNEE ARTHROSCOPY WITH MEDIAL MENISECTOMY Left 12/04/2017   Procedure: KNEE ARTHROSCOPY WITH MEDIAL MENISCECTOMY AND CHONDROPLASTY;  Surgeon: Cristy Bonner DASEN, MD;  Location: [facility];  Service: Orthopedics;  Laterality: Left;  NO BLOCK   POLYPECTOMY  03/05/2019   Procedure: POLYPECTOMY;  Surgeon: Shaaron Lamar HERO, MD;  [facility];  Service: Endoscopy;;   STONE EXTRACTION WITH BASKET Bilateral 08/27/2021   Procedure: STONE EXTRACTION WITH BASKET;  Surgeon: Sherrilee Belvie LITTIE, MD;  Location: [facility];  Service: Urology;  Laterality: Bilateral;   TOTAL ABDOMINAL HYSTERECTOMY W/ BILATERAL SALPINGOOPHORECTOMY  10/15/2006   TUBAL LIGATION      Family History  Problem Relation Age of Onset   Hypertension Brother    Kidney failure Brother        had kidney transplant   Hyperlipidemia Brother    Hypertension Sister    Hyperlipidemia Sister    Cancer Sister        cervical  Hypertension Brother    Kidney failure Father    Cancer Mother    Colon cancer Neg Hx     Social History   Socioeconomic History   Marital status: Single    Spouse name: Not on file   Number of children: Not on file   Years of education: Not on file   Highest education level: Not on file  Occupational History   Not on file  Tobacco Use   Smoking status: Never   Smokeless tobacco: Never  Vaping Use   Vaping status: Never Used  Substance and Sexual Activity    Alcohol use: No   Drug use: No   Sexual activity: Not Currently    Partners: Male    Birth control/protection: Surgical    Comment: hyst     tubal ligation  Other Topics Concern   Not on file  Social History Narrative   Not on file   Social Drivers of Health   Financial Resource Strain: Patient Declined (07/14/2023)   Overall Financial Resource Strain (CARDIA)    Difficulty of Paying Living Expenses: Patient declined  Food Insecurity: No Food Insecurity (07/14/2023)   Hunger Vital Sign    Worried About Running Out of Food in the Last Year: Never true    Ran Out of Food in the Last Year: Never true  Transportation Needs: No Transportation Needs (07/14/2023)   PRAPARE - Administrator, Civil Service (Medical): No    Lack of Transportation (Non-Medical): No  Physical Activity: Insufficiently Active (07/14/2023)   Exercise Vital Sign    Days of Exercise per Week: 1 day    Minutes of Exercise per Session: 10 min  Stress: No Stress Concern Present (07/14/2023)   [employer] of Occupational Health - Occupational Stress Questionnaire    Feeling of Stress : Not at all  Social Connections: Unknown (07/14/2023)   Social Connection and Isolation Panel    Frequency of Communication with Friends and Family: Once a week    Frequency of Social Gatherings with Friends and Family: Never    Attends Religious Services: Patient declined    Database administrator or Organizations: Yes    Attends Banker Meetings: Patient declined    Marital Status: Never married  Intimate Partner Violence: Not At Risk (07/14/2023)   Humiliation, Afraid, Rape, and Kick questionnaire    Fear of Current or Ex-Partner: No    Emotionally Abused: No    Physically Abused: No    Sexually Abused: No    ROS Review of Systems  Constitutional:  Negative for chills and fever.  Eyes:  Negative for visual disturbance.  Respiratory:  Negative for chest tightness and shortness of breath.    Musculoskeletal:        Left knee pain  Neurological:  Negative for dizziness and headaches.    Objective:   Today's Vitals: BP (!) 140/80   Pulse (!) 54   Resp 16   Ht 5' 2 (1.575 m)   Wt 184 lb (83.5 kg)   SpO2 98%   BMI 33.65 kg/m   Physical Exam HENT:     Head: Normocephalic.     Mouth/Throat:     Mouth: Mucous membranes are moist.  Cardiovascular:     Rate and Rhythm: Normal rate.     Heart sounds: Normal heart sounds.  Pulmonary:     Effort: Pulmonary effort is normal.     Breath sounds: Normal breath sounds.  Musculoskeletal:  Left knee: No swelling, deformity, effusion, erythema or ecchymosis. Normal range of motion. No tenderness.  Neurological:     Mental Status: She is alert.      Assessment & Plan:   Chronic pain of left knee Assessment & Plan: Encouraged to  stop by [facility] an X-ray of the left knee to assess for structural abnormalities. -Start naproxen  500 mg orally twice daily with food for pain and inflammation. -Avoid prolonged standing or repetitive stress on the affected knee. -Encourage supportive footwear and use of a cushioned mat at work, if possible. -Recommend icing the knee for 15-20 minutes, 2-3 times daily.  Orders: -     DG Knee 1-2 Views Left -     Naproxen ; Take 1 tablet (500 mg total) by mouth 2 (two) times daily as needed.  Dispense: 30 tablet; Refill: 1  Primary hypertension Assessment & Plan: The patient's blood pressure is uncontrolled in the clinic today. She was encouraged to resume her prescribed treatment regimen and was counseled on the importance of medication compliance and adherence. A low-sodium diet and increased physical activity were also strongly encouraged to support blood pressure control.     IFG (impaired fasting glucose) -     Hemoglobin A1c  Vitamin D  deficiency -     VITAMIN D  25 Hydroxy (Vit-D Deficiency, Fractures)  Need for hepatitis C screening test  Encounter for screening  for HIV  TSH (thyroid-stimulating hormone deficiency) -     TSH + free T4  Other hyperlipidemia -     Lipid panel -     CMP14+EGFR -     CBC with Differential/Platelet  Osteoporosis screening -     HM DEXA SCAN   Note: This chart has been completed using Engineer, civil (consulting) software, and while attempts have been made to ensure accuracy, certain words and phrases may not be transcribed as intended.    Follow-up: Return in about 4 months (around 05/10/2024).   Maddeline Roorda, FNP

## 2024-01-10 LAB — LIPID PANEL
Chol/HDL Ratio: 3.3 ratio (ref 0.0–4.4)
Cholesterol, Total: 189 mg/dL (ref 100–199)
HDL: 57 mg/dL (ref >39)
LDL Chol Calc (NIH): 120 mg/dL — ABNORMAL HIGH (ref 0–99)
Triglycerides: 65 mg/dL (ref 0–149)
VLDL Cholesterol Cal: 12 mg/dL (ref 5–40)

## 2024-01-10 LAB — HEMOGLOBIN A1C
Est. average glucose Bld gHb Est-mCnc: 123 mg/dL
Hgb A1c MFr Bld: 5.9 % — ABNORMAL HIGH (ref 4.8–5.6)

## 2024-01-10 LAB — CMP14+EGFR
ALT: 11 IU/L (ref 0–32)
AST: 17 IU/L (ref 0–40)
Albumin: 4.2 g/dL (ref 3.9–4.9)
Alkaline Phosphatase: 115 IU/L (ref 44–121)
BUN/Creatinine Ratio: 27 (ref 12–28)
BUN: 24 mg/dL (ref 8–27)
Bilirubin Total: <0.2 mg/dL (ref 0.0–1.2)
CO2: 23 mmol/L (ref 20–29)
Calcium: 9.6 mg/dL (ref 8.7–10.3)
Chloride: 100 mmol/L (ref 96–106)
Creatinine, Ser: 0.9 mg/dL (ref 0.57–1.00)
Globulin, Total: 2.6 g/dL (ref 1.5–4.5)
Glucose: 87 mg/dL (ref 70–99)
Potassium: 3.9 mmol/L (ref 3.5–5.2)
Sodium: 139 mmol/L (ref 134–144)
Total Protein: 6.8 g/dL (ref 6.0–8.5)
eGFR: 71 mL/min/1.73 (ref >59)

## 2024-01-10 LAB — CBC WITH DIFFERENTIAL/PLATELET
Basophils Absolute: 0 x10E3/uL (ref 0.0–0.2)
Basos: 0 %
EOS (ABSOLUTE): 0.1 x10E3/uL (ref 0.0–0.4)
Eos: 2 %
Hematocrit: 40.3 % (ref 34.0–46.6)
Hemoglobin: 12.8 g/dL (ref 11.1–15.9)
Immature Grans (Abs): 0 x10E3/uL (ref 0.0–0.1)
Immature Granulocytes: 0 %
Lymphocytes Absolute: 2.3 x10E3/uL (ref 0.7–3.1)
Lymphs: 31 %
MCH: 28 pg (ref 26.6–33.0)
MCHC: 31.8 g/dL (ref 31.5–35.7)
MCV: 88 fL (ref 79–97)
Monocytes Absolute: 0.8 x10E3/uL (ref 0.1–0.9)
Monocytes: 11 %
Neutrophils Absolute: 4.2 x10E3/uL (ref 1.4–7.0)
Neutrophils: 56 %
Platelets: 246 x10E3/uL (ref 150–450)
RBC: 4.57 x10E6/uL (ref 3.77–5.28)
RDW: 13.1 % (ref 11.7–15.4)
WBC: 7.5 x10E3/uL (ref 3.4–10.8)

## 2024-01-10 LAB — TSH+FREE T4
Free T4: 1.13 ng/dL (ref 0.82–1.77)
TSH: 0.863 u[IU]/mL (ref 0.450–4.500)

## 2024-01-10 LAB — VITAMIN D 25 HYDROXY (VIT D DEFICIENCY, FRACTURES): Vit D, 25-Hydroxy: 39 ng/mL (ref 30.0–100.0)

## 2024-01-12 ENCOUNTER — Ambulatory Visit (HOSPITAL_COMMUNITY)
Admission: RE | Admit: 2024-01-12 | Discharge: 2024-01-12 | Disposition: A | Discharge status: Home / Self Care | Source: Ambulatory Visit | Attending: Urology | Admitting: Urology

## 2024-01-12 ENCOUNTER — Other Ambulatory Visit (HOSPITAL_COMMUNITY): Payer: Self-pay | Admitting: Adult Health

## 2024-01-12 DIAGNOSIS — R1084 Generalized abdominal pain: Secondary | ICD-10-CM | POA: Insufficient documentation

## 2024-01-12 DIAGNOSIS — Z1231 Encounter for screening mammogram for malignant neoplasm of breast: Secondary | ICD-10-CM

## 2024-01-12 DIAGNOSIS — Z87442 Personal history of urinary calculi: Secondary | ICD-10-CM | POA: Diagnosis present

## 2024-01-13 ENCOUNTER — Ambulatory Visit: Admitting: Adult Health

## 2024-01-13 NOTE — Assessment & Plan Note (Signed)
 Encouraged to  stop by [facility] an X-ray of the left knee to assess for structural abnormalities. -Start naproxen  500 mg orally twice daily with food for pain and inflammation. -Avoid prolonged standing or repetitive stress on the affected knee. -Encourage supportive footwear and use of a cushioned mat at work, if possible. -Recommend icing the knee for 15-20 minutes, 2-3 times daily.

## 2024-01-13 NOTE — Assessment & Plan Note (Signed)
 The patient's blood pressure is uncontrolled in the clinic today. She was encouraged to resume her prescribed treatment regimen and was counseled on the importance of medication compliance and adherence. A low-sodium diet and increased physical activity were also strongly encouraged to support blood pressure control.

## 2024-01-15 ENCOUNTER — Ambulatory Visit: Admitting: Adult Health

## 2024-01-15 ENCOUNTER — Encounter: Payer: Self-pay | Admitting: Adult Health

## 2024-01-15 ENCOUNTER — Encounter (INDEPENDENT_AMBULATORY_CARE_PROVIDER_SITE_OTHER): Payer: Self-pay | Admitting: *Deleted

## 2024-01-15 ENCOUNTER — Ambulatory Visit: Payer: Self-pay | Admitting: Family Medicine

## 2024-01-15 VITALS — BP 139/80 | HR 60 | Ht 62.0 in | Wt 186.0 lb

## 2024-01-15 DIAGNOSIS — E7849 Other hyperlipidemia: Secondary | ICD-10-CM

## 2024-01-15 DIAGNOSIS — I10 Essential (primary) hypertension: Secondary | ICD-10-CM

## 2024-01-15 MED ORDER — HYDROCHLOROTHIAZIDE 12.5 MG PO CAPS: 12.5000 mg | ORAL_CAPSULE | Freq: Every day | ORAL | 3 refills | Status: DC

## 2024-01-15 MED ORDER — AMLODIPINE BESYLATE 5 MG PO TABS: 5.0000 mg | ORAL_TABLET | Freq: Every day | ORAL | 3 refills | Status: AC

## 2024-01-15 MED ORDER — ROSUVASTATIN CALCIUM 10 MG PO TABS: 10.0000 mg | ORAL_TABLET | Freq: Every day | ORAL | 1 refills | Status: DC

## 2024-01-15 NOTE — Progress Notes (Signed)
  Subjective:     Patient ID: Shelby Guerra, female   DOB: 01-08-1956, 68 y.o.   MRN: [id number]  HPI Miki is a 68 year old black female, single, sp hysterectomy, in for follow up on BP. She worked 12 hours last night.  PCP is Gloria Zarwolo NP  Review of Systems Denies any headaches or swelling  Having knee pain, saw PCP this week and started on naprosyn   Reviewed past medical,surgical, social and family history. Reviewed medications and allergies.     Objective:   Physical Exam BP 139/80 (BP Location: Left Arm, Patient Position: Sitting, Cuff Size: Normal)   Pulse 60   Ht 5' 2 (1.575 m)   Wt 186 lb (84.4 kg)   BMI 34.02 kg/m     Skin warm and dry. Lungs: clear to ausculation bilaterally. Cardiovascular: regular rate and rhythm.   Upstream - 01/15/24 0837       Pregnancy Intention Screening   Does the patient want to become pregnant in the next year? N/A    Does the patient's partner want to become pregnant in the next year? N/A    Would the patient like to discuss contraceptive options today? N/A      Contraception Wrap Up   Current Method Female Sterilization   hyst   End Method Female Sterilization   hyst   Contraception Counseling Provided No          Assessment:     1. Hypertension, unspecified type (Primary) Will continue Norvasc  5 mg and Microzide  12.5 mg 1 daily,refills sent  Meds ordered this encounter  Medications   amLODipine  (NORVASC ) 5 MG tablet    Sig: Take 1 tablet (5 mg total) by mouth daily.    Dispense:  90 tablet    Refill:  3    PATIENT REQUESTING A 90 DAY SUPPLY WITH REFILLS    Supervising Provider:   JAYNE MINDER H [[id number]]   hydrochlorothiazide  (MICROZIDE ) 12.5 MG capsule    Sig: Take 1 capsule (12.5 mg total) by mouth daily.    Dispense:  90 capsule    Refill:  3    Supervising Provider:   JAYNE MINDER DEL [[id number]]        Plan:     Return in 6 months for physical and BP check

## 2024-02-10 ENCOUNTER — Telehealth: Payer: Self-pay

## 2024-02-10 NOTE — Telephone Encounter (Signed)
 Who is your primary care physician: Dr.Gloria Zarwolo  Reasons for the colonoscopy: Hx polyps  Have you had a colonoscopy before?  Yes 03-05-2019 Dr.Rourk  Do you have family history of colon cancer? no  Previous colonoscopy with polyps removed? yes  Do you have a history colorectal cancer?   no  Are you diabetic? If yes, Type 1 or Type 2?    no  Do you have a prosthetic or mechanical heart valve? no  Do you have a pacemaker/defibrillator?   no  Have you had endocarditis/atrial fibrillation? no  Have you had joint replacement within the last 12 months?  no  Do you tend to be constipated or have to use laxatives? no  Do you have any history of drugs or alchohol?  no  Do you use supplemental oxygen?  no  Have you had a stroke or heart attack within the last 6 months? no  Do you take weight loss medication?  no  For female patients: have you had a hysterectomy?  yes                                     are you post menopausal?                                                   do you still have your menstrual cycle? no      Do you take any blood-thinning medications such as: (aspirin , warfarin, Plavix, Aggrenox)  no  If yes we need the name, milligram, dosage and who is prescribing doctor  Current Outpatient Medications on File Prior to Visit  Medication Sig Dispense Refill   amLODipine  (NORVASC ) 5 MG tablet Take 1 tablet (5 mg total) by mouth daily. 90 tablet 3   rosuvastatin  (CRESTOR ) 10 MG tablet Take 1 tablet (10 mg total) by mouth daily. 90 tablet 1   sertraline  (ZOLOFT ) 50 MG tablet TAKE ONE (1) TABLET BY MOUTH EVERY DAY 90 tablet 0   No current facility-administered medications on file prior to visit.    Allergies  Allergen Reactions   Codeine Itching    Can take vicodin   Propoxyphene N-Acetaminophen  Nausea Only    Darvocet     Pharmacy: [facility]  Primary Insurance Name: CHARON [id number], Medicare part A/B   Best number where you can be  reached: [phone]

## 2024-02-13 ENCOUNTER — Other Ambulatory Visit: Payer: Self-pay

## 2024-02-13 DIAGNOSIS — Z87442 Personal history of urinary calculi: Secondary | ICD-10-CM

## 2024-02-13 NOTE — Progress Notes (Signed)
 error

## 2024-02-16 ENCOUNTER — Ambulatory Visit (HOSPITAL_COMMUNITY)
Admission: RE | Admit: 2024-02-16 | Discharge: 2024-02-16 | Disposition: A | Discharge status: Home / Self Care | Source: Ambulatory Visit | Attending: Urology | Admitting: Urology

## 2024-02-16 DIAGNOSIS — M25562 Pain in left knee: Secondary | ICD-10-CM | POA: Diagnosis present

## 2024-02-16 DIAGNOSIS — G8929 Other chronic pain: Secondary | ICD-10-CM | POA: Insufficient documentation

## 2024-02-16 DIAGNOSIS — Z87442 Personal history of urinary calculi: Secondary | ICD-10-CM | POA: Diagnosis present

## 2024-02-20 ENCOUNTER — Ambulatory Visit: Payer: BC Managed Care – PPO | Admitting: Urology

## 2024-02-20 DIAGNOSIS — N200 Calculus of kidney: Secondary | ICD-10-CM

## 2024-02-23 ENCOUNTER — Ambulatory Visit (HOSPITAL_COMMUNITY)

## 2024-02-23 ENCOUNTER — Ambulatory Visit (HOSPITAL_COMMUNITY)
Admission: RE | Admit: 2024-02-23 | Discharge: 2024-02-23 | Disposition: A | Discharge status: Home / Self Care | Source: Ambulatory Visit | Attending: Adult Health | Admitting: Adult Health

## 2024-02-23 DIAGNOSIS — Z1231 Encounter for screening mammogram for malignant neoplasm of breast: Secondary | ICD-10-CM | POA: Diagnosis present

## 2024-02-25 ENCOUNTER — Ambulatory Visit: Payer: Self-pay | Admitting: Adult Health

## 2024-03-05 ENCOUNTER — Other Ambulatory Visit: Payer: Self-pay | Admitting: Gastroenterology

## 2024-03-05 NOTE — Telephone Encounter (Signed)
ASA 2 okay to schedule.

## 2024-03-08 NOTE — Telephone Encounter (Signed)
 LMOVM to call back

## 2024-03-08 NOTE — Telephone Encounter (Signed)
 Spoke with patient to schedule procedure. She is going to check with her work to see if 03/18/2024 at 11:15 will work and she will call back in the morning. I have held a spot on the schedule with her name and MRN.

## 2024-03-09 MED ORDER — PEG 3350-KCL-NA BICARB-NACL 420 G PO SOLR: 4000.0000 mL | Freq: Once | ORAL | 0 refills | Status: AC

## 2024-03-09 NOTE — Addendum Note (Signed)
 Addended by: DALLIE LIONEL RAMAN on: 03/09/2024 03:28 PM   Modules accepted: Orders

## 2024-03-09 NOTE — Telephone Encounter (Signed)
 Questionnaire from recall, no referral needed

## 2024-03-09 NOTE — Telephone Encounter (Signed)
 Spoke with pt, scheduled TCS for 03/18/2024 at 11:15am. Rx sent to pharmacy. Instructions sent through the mail.

## 2024-03-17 ENCOUNTER — Ambulatory Visit (INDEPENDENT_AMBULATORY_CARE_PROVIDER_SITE_OTHER): Admitting: Urology

## 2024-03-17 ENCOUNTER — Encounter: Payer: Self-pay | Admitting: Urology

## 2024-03-17 VITALS — BP 132/75 | HR 60

## 2024-03-17 DIAGNOSIS — N200 Calculus of kidney: Secondary | ICD-10-CM

## 2024-03-17 LAB — URINALYSIS, ROUTINE W REFLEX MICROSCOPIC
Bilirubin, UA: NEGATIVE
Glucose, UA: NEGATIVE
Ketones, UA: NEGATIVE
Nitrite, UA: NEGATIVE
Protein,UA: NEGATIVE
Specific Gravity, UA: 1.02 (ref 1.005–1.030)
Urobilinogen, Ur: 1 mg/dL (ref 0.2–1.0)
pH, UA: 6.5 (ref 5.0–7.5)

## 2024-03-17 LAB — MICROSCOPIC EXAMINATION: Bacteria, UA: NONE SEEN

## 2024-03-17 NOTE — Patient Instructions (Signed)

## 2024-03-17 NOTE — Progress Notes (Unsigned)
 03/17/2024 10:35 AM   Shelby Guerra May 11, 1956 [id number]  Referring provider: Job Bolt, PA [address] [place],  [place] [address]  No chief complaint on file.   HPI:    PMH: Past Medical History:  Diagnosis Date   GERD (gastroesophageal reflux disease)    High cholesterol    History of kidney stones    Hot flashes 03/10/2013   Hypertension    pt denies   Impaired fasting glucose    Moody 03/14/2014    Surgical History: Past Surgical History:  Procedure Laterality Date   COLONOSCOPY  12/13/2008   COLONOSCOPY N/A 03/05/2019   Procedure: COLONOSCOPY;  Surgeon: Shelby Lamar HERO, MD;  [facility];  Service: Endoscopy;  Laterality: N/A;  10:30   CYSTOSCOPY WITH RETROGRADE PYELOGRAM, URETEROSCOPY AND STENT PLACEMENT Bilateral 08/27/2021   Procedure: BILATERAL RETROGRADE PYELOGRAM, BILATERAL URETEROSCOPY;  Surgeon: Shelby Belvie CROME, MD;  Location: [facility];  Service: Urology;  Laterality: Bilateral;   CYSTOSCOPY WITH RETROGRADE PYELOGRAM, URETEROSCOPY AND STENT PLACEMENT Right 09/13/2021   Procedure: CYSTOSCOPY WITH RETROGRADE PYELOGRAM, URETEROSCOPY AND STENT REMOVAL;  Surgeon: Shelby Belvie CROME, MD;  Location: [facility];  Service: Urology;  Laterality: Right;   CYSTOSCOPY WITH STENT PLACEMENT Right 08/27/2021   Procedure: CYSTOSCOPY WITH STENT PLACEMENT;  Surgeon: Shelby Belvie CROME, MD;  Location: [facility];  Service: Urology;  Laterality: Right;   INGUINAL HERNIA REPAIR Left 02/26/2006   recurrent   INGUINAL HERNIA REPAIR Left    prior to 2007   KNEE ARTHROSCOPY WITH MEDIAL MENISECTOMY Left 12/04/2017   Procedure: KNEE ARTHROSCOPY WITH MEDIAL MENISCECTOMY AND CHONDROPLASTY;  Surgeon: Shelby Bonner DASEN, MD;  Location: [facility];  Service: Orthopedics;  Laterality: Left;  NO BLOCK   POLYPECTOMY  03/05/2019   Procedure: POLYPECTOMY;  Surgeon: Shelby Lamar HERO, MD;  [facility];  Service: Endoscopy;;   STONE EXTRACTION WITH BASKET Bilateral  08/27/2021   Procedure: STONE EXTRACTION WITH BASKET;  Surgeon: Shelby Belvie CROME, MD;  Location: [facility];  Service: Urology;  Laterality: Bilateral;   TOTAL ABDOMINAL HYSTERECTOMY W/ BILATERAL SALPINGOOPHORECTOMY  10/15/2006   TUBAL LIGATION      Home Medications:  Allergies as of 03/17/2024       Reactions   Codeine Itching   Can take vicodin   Propoxyphene N-acetaminophen  Nausea Only   Darvocet        Medication List        Accurate as of March 17, 2024 10:35 AM. If you have any questions, ask your nurse or doctor.          amLODipine  5 MG tablet Commonly known as: NORVASC  Take 1 tablet (5 mg total) by mouth daily.   hydrochlorothiazide  12.5 MG capsule Commonly known as: MICROZIDE  Take 12.5 mg by mouth daily.   rosuvastatin  10 MG tablet Commonly known as: Crestor  Take 1 tablet (10 mg total) by mouth daily.   sertraline  50 MG tablet Commonly known as: ZOLOFT  TAKE ONE (1) TABLET BY MOUTH EVERY DAY        Allergies:  Allergies  Allergen Reactions   Codeine Itching    Can take vicodin   Propoxyphene N-Acetaminophen  Nausea Only    Darvocet    Family History: Family History  Problem Relation Age of Onset   Hypertension Brother    Kidney failure Brother        had kidney transplant   Hyperlipidemia Brother    Hypertension Sister    Hyperlipidemia Sister  Cancer Sister        cervical   Hypertension Brother    Kidney failure Father    Cancer Mother    Colon cancer Neg Hx     Social History:  reports that she has never smoked. She has never used smokeless tobacco. She reports that she does not drink alcohol and does not use drugs.  ROS: All other review of systems were reviewed and are negative except what is noted above in HPI  Physical Exam: BP 132/75   Pulse 60   Constitutional:  Alert and oriented, No acute distress. HEENT: [place] AT, moist mucus membranes.  Trachea midline, no masses. Cardiovascular: No clubbing, cyanosis, or  edema. Respiratory: Normal respiratory effort, no increased work of breathing. GI: Abdomen is soft, nontender, nondistended, no abdominal masses GU: No CVA tenderness.  Lymph: No cervical or inguinal lymphadenopathy. Skin: No rashes, bruises or suspicious lesions. Neurologic: Grossly intact, no focal deficits, moving all 4 extremities. Psychiatric: Normal mood and affect.  Laboratory Data: Lab Results  Component Value Date   WBC 7.5 01/09/2024   HGB 12.8 01/09/2024   HCT 40.3 01/09/2024   MCV 88 01/09/2024   PLT 246 01/09/2024    Lab Results  Component Value Date   CREATININE 0.90 01/09/2024    No results found for: PSA  No results found for: TESTOSTERONE  Lab Results  Component Value Date   HGBA1C 5.9 (H) 01/09/2024    Urinalysis    Component Value Date/Time   COLORURINE AMBER (A) 05/09/2018 0949   APPEARANCEUR Clear 02/19/2023 1107   LABSPEC 1.014 05/09/2018 0949   PHURINE 5.0 05/09/2018 0949   GLUCOSEU Negative 02/19/2023 1107   HGBUR SMALL (A) 05/09/2018 0949   BILIRUBINUR negative 11/15/2023 1452   BILIRUBINUR Negative 02/19/2023 1107   KETONESUR negative 11/15/2023 1452   KETONESUR NEGATIVE 05/09/2018 0949   PROTEINUR negative 11/15/2023 1452   PROTEINUR Negative 02/19/2023 1107   PROTEINUR NEGATIVE 05/09/2018 0949   UROBILINOGEN 0.2 11/15/2023 1452   UROBILINOGEN 0.2 01/29/2015 2352   NITRITE Negative 11/15/2023 1452   NITRITE Negative 02/19/2023 1107   NITRITE POSITIVE (A) 05/09/2018 0949   LEUKOCYTESUR Trace (A) 11/15/2023 1452   LEUKOCYTESUR 2+ (A) 02/19/2023 1107    Lab Results  Component Value Date   LABMICR See below: 02/19/2023   WBCUA 11-30 (A) 02/19/2023   RBCUA 0-2 12/17/2016   LABEPIT >10 (A) 02/19/2023   MUCUS Present 05/31/2019   BACTERIA Moderate (A) 02/19/2023    Pertinent Imaging: *** Results for orders placed during the hospital encounter of 02/16/24  Abdomen 1 view (KUB)  Narrative CLINICAL DATA:  Follow-up known  renal calculi  EXAM: ABDOMEN - 1 VIEW  COMPARISON:  01/12/2024  FINDINGS: Scattered large and small bowel gas is noted. The kidneys are somewhat obscured by overlying bowel content. The known small calculi are not as well appreciated on this exam.  IMPRESSION: Renal outlines are somewhat obscured by overlying bowel content. The known intrarenal calculi are not as well appreciated   Electronically Signed By: Oneil Devonshire M.D. On: 02/22/2024 20:56  No results found for this or any previous visit.  No results found for this or any previous visit.  No results found for this or any previous visit.  Results for orders placed during the hospital encounter of 05/01/22  Ultrasound renal complete  Narrative CLINICAL DATA:  Nephrolithiasis  EXAM: RENAL / URINARY TRACT ULTRASOUND COMPLETE  COMPARISON:  CT abdomen and pelvis 02/15/2022  FINDINGS: Right Kidney:  Renal measurements: 8.3 x 3.8 x 4.3 cm = volume: 70 mL. Cortical thinning. Normal cortical echogenicity. No mass, hydronephrosis, or shadowing calcification.  Left Kidney:  Renal measurements: 9.3 x 5.2 x 4.7 cm = volume: 118 mL. Normal cortical thickness and echogenicity. No mass, hydronephrosis or shadowing calcification.  Bladder:  Appears normal for degree of bladder distention.  Other:  N/A  IMPRESSION: No evidence of renal mass or hydronephrosis.  No definite renal sonographic abnormalities identified.   Electronically Signed By: Oneil Kiss M.D. On: 05/01/2022 15:48  No results found for this or any previous visit.  No results found for this or any previous visit.  Results for orders placed in visit on 11/04/23  CT RENAL STONE STUDY  Narrative CLINICAL DATA:  Abdominal/flank pain, stone suspected  EXAM: CT ABDOMEN AND PELVIS WITHOUT CONTRAST  TECHNIQUE: Multidetector CT imaging of the abdomen and pelvis was performed following the standard protocol without IV  contrast.  RADIATION DOSE REDUCTION: This exam was performed according to the departmental dose-optimization program which includes automated exposure control, adjustment of the mA and/or kV according to patient size and/or use of iterative reconstruction technique.  COMPARISON:  CT 02/19/2023  FINDINGS: Lower chest: No acute airspace disease or pleural effusion.  Hepatobiliary: No evidence of focal liver lesion on this unenhanced exam. Gallbladder physiologically distended, no calcified stone. No biliary dilatation.  Pancreas: No ductal dilatation or inflammation.  Spleen: Normal in size without focal abnormality.  Adrenals/Urinary Tract: No adrenal nodule. No hydronephrosis. There are multiple small bilateral intrarenal calculi. Approximately 3 stones are present in both kidneys. No perinephric fat stranding or inflammation. Both ureters are decompressed without ureteral stone. Decompressed urinary bladder. No bladder stone or wall thickening.  Stomach/Bowel: Unremarkable appearance of the stomach. There is no bowel obstruction or inflammatory change. The appendix is normal. Moderate colonic stool burden. Colonic diverticulosis, greatest in the left colon, without diverticulitis. No colonic wall thickening or inflammation.  Vascular/Lymphatic: Mild aortic atherosclerosis. No aortic aneurysm. No abdominopelvic adenopathy.  Reproductive: Hysterectomy. Stable coarse calcification in the hysterectomy bed. No adnexal mass.  Other: No free air. No ascites. Diminutive fat containing umbilical hernia. Minimal fat in the right inguinal canal.  Musculoskeletal: Grade 1 anterolisthesis of L4 on L5, likely due to facet hypertrophy. There are no acute or suspicious osseous abnormalities.  IMPRESSION: 1. Multiple small bilateral intrarenal calculi. No hydronephrosis or obstructive uropathy. 2. Colonic diverticulosis without diverticulitis.  Aortic Atherosclerosis  (ICD10-I70.0).   Electronically Signed By: Andrea Gasman M.D. On: 01/20/2024 17:14   Assessment & Plan:    1. Kidney stone (Primary) Dietary handout given Followup 1 year with a KUB - Urinalysis, Routine w reflex microscopic - Abdomen 1 view (KUB); Future   No follow-ups on file.  Belvie Clara, MD  [facility] [place]

## 2024-03-18 ENCOUNTER — Ambulatory Visit (HOSPITAL_COMMUNITY)

## 2024-03-18 ENCOUNTER — Encounter (HOSPITAL_COMMUNITY)
Admission: RE | Disposition: A | Discharge status: Home / Self Care | Payer: Self-pay | Source: Home / Self Care | Attending: Internal Medicine

## 2024-03-18 ENCOUNTER — Ambulatory Visit (HOSPITAL_COMMUNITY)
Admission: RE | Admit: 2024-03-18 | Discharge: 2024-03-18 | Disposition: A | Discharge status: Home / Self Care | Attending: Internal Medicine | Admitting: Internal Medicine

## 2024-03-18 ENCOUNTER — Other Ambulatory Visit: Payer: Self-pay

## 2024-03-18 ENCOUNTER — Encounter (HOSPITAL_COMMUNITY): Payer: Self-pay | Admitting: Internal Medicine

## 2024-03-18 DIAGNOSIS — Z1211 Encounter for screening for malignant neoplasm of colon: Secondary | ICD-10-CM | POA: Diagnosis present

## 2024-03-18 DIAGNOSIS — D123 Benign neoplasm of transverse colon: Secondary | ICD-10-CM | POA: Diagnosis not present

## 2024-03-18 DIAGNOSIS — K219 Gastro-esophageal reflux disease without esophagitis: Secondary | ICD-10-CM | POA: Insufficient documentation

## 2024-03-18 DIAGNOSIS — Z860101 Personal history of adenomatous and serrated colon polyps: Secondary | ICD-10-CM

## 2024-03-18 DIAGNOSIS — I10 Essential (primary) hypertension: Secondary | ICD-10-CM | POA: Insufficient documentation

## 2024-03-18 DIAGNOSIS — F32A Depression, unspecified: Secondary | ICD-10-CM | POA: Insufficient documentation

## 2024-03-18 DIAGNOSIS — K5730 Diverticulosis of large intestine without perforation or abscess without bleeding: Secondary | ICD-10-CM | POA: Diagnosis not present

## 2024-03-18 DIAGNOSIS — D120 Benign neoplasm of cecum: Secondary | ICD-10-CM | POA: Insufficient documentation

## 2024-03-18 HISTORY — PX: COLONOSCOPY: SHX5424

## 2024-03-18 SURGERY — COLONOSCOPY
Anesthesia: General

## 2024-03-18 MED ORDER — PROPOFOL 10 MG/ML IV BOLUS
INTRAVENOUS | Status: DC | PRN
Start: 2024-03-18 — End: 2024-03-18
  Administered 2024-03-18: 100 mg via INTRAVENOUS
  Administered 2024-03-18: 125 ug/kg/min via INTRAVENOUS

## 2024-03-18 MED ORDER — LIDOCAINE HCL (CARDIAC) PF 100 MG/5ML IV SOSY
PREFILLED_SYRINGE | INTRAVENOUS | Status: DC | PRN
  Administered 2024-03-18: 50 mg via INTRAVENOUS

## 2024-03-18 MED ORDER — LACTATED RINGERS IV SOLN: INTRAVENOUS | Status: DC | PRN

## 2024-03-18 NOTE — Anesthesia Preprocedure Evaluation (Signed)
 Anesthesia Evaluation  Patient identified by MRN, date of birth, ID band Patient awake    Reviewed: Allergy & Precautions, H&P , NPO status , Patient's Chart, lab work & pertinent test results  Airway Mallampati: II  TM Distance: >3 FB Neck ROM: Full    Dental no notable dental hx.    Pulmonary neg pulmonary ROS   Pulmonary exam normal breath sounds clear to auscultation       Cardiovascular hypertension, Normal cardiovascular exam Rhythm:Regular Rate:Normal     Neuro/Psych  PSYCHIATRIC DISORDERS  Depression    negative neurological ROS     GI/Hepatic Neg liver ROS,GERD  ,,  Endo/Other  negative endocrine ROS    Renal/GU negative Renal ROS  negative genitourinary   Musculoskeletal negative musculoskeletal ROS (+)    Abdominal   Peds negative pediatric ROS (+)  Hematology negative hematology ROS (+)   Anesthesia Other Findings   Reproductive/Obstetrics negative OB ROS                              Anesthesia Physical Anesthesia Plan  ASA: 2  Anesthesia Plan: General   Post-op Pain Management:    Induction: Intravenous  PONV Risk Score and Plan:   Airway Management Planned: Nasal Cannula  Additional Equipment:   Intra-op Plan:   Post-operative Plan:   Informed Consent: I have reviewed the patients History and Physical, chart, labs and discussed the procedure including the risks, benefits and alternatives for the proposed anesthesia with the patient or authorized representative who has indicated his/her understanding and acceptance.     Dental advisory given  Plan Discussed with: CRNA  Anesthesia Plan Comments:         Anesthesia Quick Evaluation

## 2024-03-18 NOTE — Transfer of Care (Signed)
 Immediate Anesthesia Transfer of Care Note  Patient: Shelby Guerra  Procedure(s) Performed: COLONOSCOPY  Patient Location: Endoscopy Unit  Anesthesia Type:General  Level of Consciousness: drowsy, patient cooperative, and responds to stimulation  Airway & Oxygen Therapy: Patient Spontanous Breathing  Post-op Assessment: Report given to RN and Post -op Vital signs reviewed and stable  Post vital signs: Reviewed and stable  Last Vitals:  Vitals Value Taken Time  BP 115/51 03/18/24 11:26  Temp 36.7 C 03/18/24 11:26  Pulse 79 03/18/24 11:26  Resp 20 03/18/24 11:26  SpO2 97 % 03/18/24 11:26    Last Pain:  Vitals:   03/18/24 1126  TempSrc: Oral  PainSc:          Complications: No notable events documented.

## 2024-03-18 NOTE — Anesthesia Postprocedure Evaluation (Signed)
 Anesthesia Post Note  Patient: Shelby Guerra  Procedure(s) Performed: COLONOSCOPY  Patient location during evaluation: PACU Anesthesia Type: General Level of consciousness: awake and alert Pain management: pain level controlled Vital Signs Assessment: post-procedure vital signs reviewed and stable Respiratory status: spontaneous breathing, nonlabored ventilation, respiratory function stable and patient connected to nasal cannula oxygen Cardiovascular status: blood pressure returned to baseline and stable Postop Assessment: no apparent nausea or vomiting Anesthetic complications: no   No notable events documented.   Last Vitals:  Vitals:   03/18/24 1005 03/18/24 1126  BP:  (!) 115/51  Pulse: 67 79  Resp: 15 20  Temp: 36.6 C 36.7 C  SpO2: 99% 97%    Last Pain:  Vitals:   03/18/24 1129  TempSrc:   PainSc: 0-No pain                 Andrea Limes

## 2024-03-18 NOTE — Op Note (Signed)
 [facility] Patient Name: Shelby Guerra Procedure Date: 03/18/2024 10:49 AM MRN: [id number] Date of Birth: [birth year] Attending MD: [name] , MD, [id number] CSN: [id number] Age: 68 Admit Type: Outpatient Procedure:                Colonoscopy Indications:              High risk colon cancer surveillance: Personal                            history of colonic polyps Providers:                [name], MD, Crystal Page, Bascom Blush Referring MD:              Medicines:                Propofol  per Anesthesia Complications:            No immediate complications. Estimated Blood Loss:     Estimated blood loss was minimal. Procedure:                Pre-Anesthesia Assessment:                           - Prior to the procedure, a History and Physical                            was performed, and patient medications and                            allergies were reviewed. The patient's tolerance of                            previous anesthesia was also reviewed. The risks                            and benefits of the procedure and the sedation                            options and risks were discussed with the patient.                            All questions were answered, and informed consent                            was obtained. Prior Anticoagulants: The patient has                            taken no anticoagulant or antiplatelet agents. ASA                            Grade Assessment: III - A patient with severe                            systemic disease. After reviewing the risks and  benefits, the patient was deemed in satisfactory                            condition to undergo the procedure.                           After obtaining informed consent, the colonoscope                            was passed under direct vision. Throughout the                            procedure, the patient's blood pressure, pulse, and                             oxygen saturations were monitored continuously. The                            [id number] ([id number]) Colon was introduced through                            the anus and advanced to the the cecum, identified                            by appendiceal orifice and ileocecal valve. The                            colonoscopy was performed without difficulty. The                            patient tolerated the procedure well. The quality                            of the bowel preparation was adequate. The                            ileocecal valve, appendiceal orifice, and rectum                            were photographed. Scope In: 11:10:25 AM Scope Out: 11:24:29 AM Scope Withdrawal Time: 0 hours 7 minutes 8 seconds  Total Procedure Duration: 0 hours 14 minutes 4 seconds  Findings:      The perianal and digital rectal examinations were normal.      Two sessile polyps were found in the hepatic flexure and cecum. The       polyps were 4 to 5 mm in size. These polyps were removed with a cold       snare. Resection and retrieval were complete. Estimated blood loss was       minimal.      Scattered diverticula were found in the colon.      The exam was otherwise without abnormality on direct and retroflexion       views. Impression:               - Two 4 to 5 mm polyps at the  hepatic flexure and                            in the cecum, removed with a cold snare. Resected                            and retrieved.                           - Diverticulosis.                           - The examination was otherwise normal on direct                            and retroflexion views. Moderate Sedation:      Moderate (conscious) sedation was personally administered by an       anesthesia professional. The following parameters were monitored: oxygen       saturation, heart rate, blood pressure, respiratory rate, EKG, adequacy       of pulmonary ventilation, and response to  care. Recommendation:           - Patient has a contact number available for                            emergencies. The signs and symptoms of potential                            delayed complications were discussed with the                            patient. Return to normal activities tomorrow.                            Written discharge instructions were provided to the                            patient.                           - Advance diet as tolerated.                           - Continue present medications.                           - Repeat colonoscopy date to be determined after                            pending pathology results are reviewed for                            surveillance.                           - Return to GI office (date not yet determined). Procedure Code(s):        ---  Professional ---                           [phone], Colonoscopy, flexible; with removal of                            tumor(s), polyp(s), or other lesion(s) by snare                            technique Diagnosis Code(s):        --- Professional ---                           Z86.010, Personal history of colonic polyps                           D12.3, Benign neoplasm of transverse colon (hepatic                            flexure or splenic flexure)                           D12.0, Benign neoplasm of cecum                           K57.30, Diverticulosis of large intestine without                            perforation or abscess without bleeding CPT copyright 2022 American Medical Association. All rights reserved. The codes documented in this report are preliminary and upon coder review may  be revised to meet current compliance requirements. [name], MD [name], MD 03/18/2024 11:32:19 AM This report has been signed electronically. Number of Addenda: 0

## 2024-03-18 NOTE — Discharge Instructions (Signed)
  Colonoscopy Discharge Instructions  Read the instructions outlined below and refer to this sheet in the next few weeks. These discharge instructions provide you with general information on caring for yourself after you leave the hospital. Your doctor may also give you specific instructions. While your treatment has been planned according to the most current medical practices available, unavoidable complications occasionally occur. If you have any problems or questions after discharge, call Dr. Shaaron at [phone]. ACTIVITY You may resume your regular activity, but move at a slower pace for the next 24 hours.  Take frequent rest periods for the next 24 hours.  Walking will help get rid of the air and reduce the bloated feeling in your belly (abdomen).  No driving for 24 hours (because of the medicine (anesthesia) used during the test).   Do not sign any important legal documents or operate any machinery for 24 hours (because of the anesthesia used during the test).  NUTRITION Drink plenty of fluids.  You may resume your normal diet as instructed by your doctor.  Begin with a light meal and progress to your normal diet. Heavy or fried foods are harder to digest and may make you feel sick to your stomach (nauseated).  Avoid alcoholic beverages for 24 hours or as instructed.  MEDICATIONS You may resume your normal medications unless your doctor tells you otherwise.  WHAT YOU CAN EXPECT TODAY Some feelings of bloating in the abdomen.  Passage of more gas than usual.  Spotting of blood in your stool or on the toilet paper.  IF YOU HAD POLYPS REMOVED DURING THE COLONOSCOPY: No aspirin  products for 7 days or as instructed.  No alcohol for 7 days or as instructed.  Eat a soft diet for the next 24 hours.  FINDING OUT THE RESULTS OF YOUR TEST Not all test results are available during your visit. If your test results are not back during the visit, make an appointment with your caregiver to find out the  results. Do not assume everything is normal if you have not heard from your caregiver or the medical facility. It is important for you to follow up on all of your test results.  SEEK IMMEDIATE MEDICAL ATTENTION IF: You have more than a spotting of blood in your stool.  Your belly is swollen (abdominal distention).  You are nauseated or vomiting.  You have a temperature over 101.  You have abdominal pain or discomfort that is severe or gets worse throughout the day.      2 polyps found and removed  Diverticulosis found  Further recommendations to follow pending review of pathology report

## 2024-03-18 NOTE — H&P (Signed)
 @LOGO @   Gastroenterology Progress Note    Primary Care Physician:  Edman Meade PEDLAR, FNP Primary Gastroenterologist:  Dr. Shaaron  Pre-Procedure History & Physical: HPI:  Shelby Guerra is a 68 y.o. female here for   Surveillance colonoscopy.  History of colonic adenoma removed 5 years ago.  Past Medical History:  Diagnosis Date   GERD (gastroesophageal reflux disease)    High cholesterol    History of kidney stones    Hot flashes 03/10/2013   Hypertension    pt denies   Impaired fasting glucose    Moody 03/14/2014    Past Surgical History:  Procedure Laterality Date   COLONOSCOPY  12/13/2008   COLONOSCOPY N/A 03/05/2019   Procedure: COLONOSCOPY;  Surgeon: Shaaron Lamar HERO, MD;  Location: [facility];  Service: Endoscopy;  Laterality: N/A;  10:30   CYSTOSCOPY WITH RETROGRADE PYELOGRAM, URETEROSCOPY AND STENT PLACEMENT Bilateral 08/27/2021   Procedure: BILATERAL RETROGRADE PYELOGRAM, BILATERAL URETEROSCOPY;  Surgeon: Sherrilee Belvie CROME, MD;  Location: [facility];  Service: Urology;  Laterality: Bilateral;   CYSTOSCOPY WITH RETROGRADE PYELOGRAM, URETEROSCOPY AND STENT PLACEMENT Right 09/13/2021   Procedure: CYSTOSCOPY WITH RETROGRADE PYELOGRAM, URETEROSCOPY AND STENT REMOVAL;  Surgeon: Sherrilee Belvie CROME, MD;  Location: [facility];  Service: Urology;  Laterality: Right;   CYSTOSCOPY WITH STENT PLACEMENT Right 08/27/2021   Procedure: CYSTOSCOPY WITH STENT PLACEMENT;  Surgeon: Sherrilee Belvie CROME, MD;  Location: [facility];  Service: Urology;  Laterality: Right;   INGUINAL HERNIA REPAIR Left 02/26/2006   recurrent   INGUINAL HERNIA REPAIR Left    prior to 2007   KNEE ARTHROSCOPY WITH MEDIAL MENISECTOMY Left 12/04/2017   Procedure: KNEE ARTHROSCOPY WITH MEDIAL MENISCECTOMY AND CHONDROPLASTY;  Surgeon: Cristy Bonner DASEN, MD;  Location: [facility];  Service: Orthopedics;  Laterality: Left;  NO BLOCK   POLYPECTOMY  03/05/2019   Procedure: POLYPECTOMY;  Surgeon: Shaaron Lamar HERO, MD;   Location: [facility];  Service: Endoscopy;;   STONE EXTRACTION WITH BASKET Bilateral 08/27/2021   Procedure: STONE EXTRACTION WITH BASKET;  Surgeon: Sherrilee Belvie CROME, MD;  Location: [facility];  Service: Urology;  Laterality: Bilateral;   TOTAL ABDOMINAL HYSTERECTOMY W/ BILATERAL SALPINGOOPHORECTOMY  10/15/2006   TUBAL LIGATION      Prior to Admission medications   Medication Sig Start Date End Date Taking? Authorizing Provider  amLODipine  (NORVASC ) 5 MG tablet Take 1 tablet (5 mg total) by mouth daily. 01/15/24  Yes Signa Nest A, NP  hydrochlorothiazide  (MICROZIDE ) 12.5 MG capsule Take 12.5 mg by mouth daily. 02/12/24  Yes [provider]  rosuvastatin  (CRESTOR ) 10 MG tablet Take 1 tablet (10 mg total) by mouth daily. 01/15/24  Yes Bacchus, Meade PEDLAR, FNP  sertraline  (ZOLOFT ) 50 MG tablet TAKE ONE (1) TABLET BY MOUTH EVERY DAY 09/22/23  Yes Kizzie Suzen SAUNDERS, CNM    Allergies as of 03/09/2024 - Review Complete 02/10/2024  Allergen Reaction Noted   Codeine Itching    Propoxyphene n-acetaminophen  Nausea Only     Family History  Problem Relation Age of Onset   Hypertension Brother    Kidney failure Brother        had kidney transplant   Hyperlipidemia Brother    Hypertension Sister    Hyperlipidemia Sister    Cancer Sister        cervical   Hypertension Brother    Kidney failure Father    Cancer Mother    Colon cancer Neg Hx     Social History   Socioeconomic  History   Marital status: Single    Spouse name: Not on file   Number of children: Not on file   Years of education: Not on file   Highest education level: Not on file  Occupational History   Not on file  Tobacco Use   Smoking status: Never   Smokeless tobacco: Never  Vaping Use   Vaping status: Never Used  Substance and Sexual Activity   Alcohol use: No   Drug use: No   Sexual activity: Not Currently    Partners: Male    Birth control/protection: Surgical    Comment: hyst     tubal ligation   Other Topics Concern   Not on file  Social History Narrative   Not on file   Social Drivers of Health   Financial Resource Strain: Patient Declined (07/14/2023)   Overall Financial Resource Strain (CARDIA)    Difficulty of Paying Living Expenses: Patient declined  Food Insecurity: No Food Insecurity (07/14/2023)   Hunger Vital Sign    Worried About Running Out of Food in the Last Year: Never true    Ran Out of Food in the Last Year: Never true  Transportation Needs: No Transportation Needs (07/14/2023)   PRAPARE - Administrator, Civil Service (Medical): No    Lack of Transportation (Non-Medical): No  Physical Activity: Insufficiently Active (07/14/2023)   Exercise Vital Sign    Days of Exercise per Week: 1 day    Minutes of Exercise per Session: 10 min  Stress: No Stress Concern Present (07/14/2023)   [employer] of Occupational Health - Occupational Stress Questionnaire    Feeling of Stress : Not at all  Social Connections: Unknown (07/14/2023)   Social Connection and Isolation Panel    Frequency of Communication with Friends and Family: Once a week    Frequency of Social Gatherings with Friends and Family: Never    Attends Religious Services: Patient declined    Database administrator or Organizations: Yes    Attends Banker Meetings: Patient declined    Marital Status: Never married  Intimate Partner Violence: Not At Risk (07/14/2023)   Humiliation, Afraid, Rape, and Kick questionnaire    Fear of Current or Ex-Partner: No    Emotionally Abused: No    Physically Abused: No    Sexually Abused: No    Review of Systems   See HPI, otherwise negative ROS  Physical Exam: Pulse 67   Temp 97.9 F (36.6 C) (Oral)   Resp 15   Ht 5' 2 (1.575 m)   Wt 77.1 kg   SpO2 99%   BMI 31.09 kg/m  General:   Alert,  Well-developed, well-nourished, pleasant and cooperative in NAD Neck:  Supple; no masses or thyromegaly. No significant cervical adenopathy. Lungs:   Clear throughout to auscultation.   No wheezes, crackles, or rhonchi. No acute distress. Heart:  Regular rate and rhythm; no murmurs, clicks, rubs,  or gallops. Abdomen: Non-distended, normal bowel sounds.  Soft and nontender without appreciable mass or hepatosplenomegaly.   Impression/Plan:     Pleasant 68 year old lady with a history of colonic adenoma here for surveillance colonoscopy.   The risks, benefits, limitations, alternatives and imponderables have been reviewed with the patient. Questions have been answered. All parties are agreeable.      Notice: This dictation was prepared with Dragon dictation along with smaller phrase technology. Any transcriptional errors that result from this process are unintentional and may not be corrected upon review.

## 2024-03-19 LAB — SURGICAL PATHOLOGY

## 2024-03-20 ENCOUNTER — Ambulatory Visit: Payer: Self-pay | Admitting: Internal Medicine

## 2024-03-22 ENCOUNTER — Encounter (HOSPITAL_COMMUNITY): Payer: Self-pay | Admitting: Internal Medicine

## 2024-04-15 ENCOUNTER — Other Ambulatory Visit: Payer: Self-pay | Admitting: Adult Health

## 2024-04-15 ENCOUNTER — Other Ambulatory Visit: Payer: Self-pay | Admitting: Women's Health

## 2024-05-14 ENCOUNTER — Other Ambulatory Visit: Payer: Self-pay | Admitting: Family Medicine

## 2024-05-14 DIAGNOSIS — E7849 Other hyperlipidemia: Secondary | ICD-10-CM

## 2024-05-27 ENCOUNTER — Ambulatory Visit
Admission: EM | Admit: 2024-05-27 | Discharge: 2024-05-27 | Disposition: A | Discharge status: Home / Self Care | Attending: Nurse Practitioner | Admitting: Nurse Practitioner

## 2024-05-27 ENCOUNTER — Other Ambulatory Visit: Payer: Self-pay

## 2024-05-27 ENCOUNTER — Ambulatory Visit: Payer: Self-pay | Admitting: Family Medicine

## 2024-05-27 ENCOUNTER — Ambulatory Visit: Admitting: Family Medicine

## 2024-05-27 ENCOUNTER — Telehealth: Admitting: Family Medicine

## 2024-05-27 DIAGNOSIS — R001 Bradycardia, unspecified: Secondary | ICD-10-CM | POA: Diagnosis present

## 2024-05-27 DIAGNOSIS — R112 Nausea with vomiting, unspecified: Secondary | ICD-10-CM | POA: Insufficient documentation

## 2024-05-27 DIAGNOSIS — N3001 Acute cystitis with hematuria: Secondary | ICD-10-CM | POA: Diagnosis not present

## 2024-05-27 DIAGNOSIS — R197 Diarrhea, unspecified: Secondary | ICD-10-CM | POA: Insufficient documentation

## 2024-05-27 LAB — POCT URINE DIPSTICK
Bilirubin, UA: NEGATIVE
Glucose, UA: NEGATIVE mg/dL
Ketones, POC UA: NEGATIVE mg/dL
Nitrite, UA: NEGATIVE
POC PROTEIN,UA: NEGATIVE
Spec Grav, UA: 1.02 (ref 1.010–1.025)
Urobilinogen, UA: 1 U/dL
pH, UA: 6.5 (ref 5.0–8.0)

## 2024-05-27 LAB — POC COVID19/FLU A&B COMBO
Covid Antigen, POC: NEGATIVE
Influenza A Antigen, POC: NEGATIVE
Influenza B Antigen, POC: NEGATIVE

## 2024-05-27 MED ORDER — FLUTICASONE PROPIONATE 50 MCG/ACT NA SUSP: 2.0000 | Freq: Every day | NASAL | 0 refills | Status: AC

## 2024-05-27 MED ORDER — ONDANSETRON 4 MG PO TBDP: 4.0000 mg | ORAL_TABLET | Freq: Three times a day (TID) | ORAL | 0 refills | Status: AC | PRN

## 2024-05-27 MED ORDER — PROMETHAZINE-DM 6.25-15 MG/5ML PO SYRP: 5.0000 mL | ORAL_SOLUTION | Freq: Four times a day (QID) | ORAL | 0 refills | Status: AC | PRN

## 2024-05-27 NOTE — ED Provider Notes (Signed)
 [facility]    CSN: [id number] Arrival date & time: 05/27/24  0857      History   Chief Complaint No chief complaint on file.   HPI Shelby Guerra is a 68 y.o. female.   The history is provided by the patient.   Patient presents with a 4-day history of nausea, vomiting, diarrhea, cough, headache, and nasal congestion.  Patient denies fever, ear pain, ear drainage, wheezing, difficulty breathing, abdominal pain, constipation, or rash.  Patient states that she has not had any nausea or vomiting or diarrhea today.  She denies any obvious close sick contacts.  States she has not been eating or drinking very much since her symptoms started.  Past Medical History:  Diagnosis Date   GERD (gastroesophageal reflux disease)    High cholesterol    History of kidney stones    Hot flashes 03/10/2013   Hypertension    pt denies   Impaired fasting glucose    Moody 03/14/2014    Patient Active Problem List   Diagnosis Date Noted   Hypertension 06/27/2022   S/P hysterectomy 06/12/2021   Screening cholesterol level 06/12/2021   Encounter for screening fecal occult blood testing 05/18/2020   Hematuria 05/31/2019   Dysuria 05/31/2019   History of kidney stones 05/31/2019   Encounter for well woman exam with routine gynecological exam 05/10/2019   Urinary frequency 05/10/2019   Screening for colorectal cancer 05/05/2018   Left knee pain 05/22/2017   Well female exam with routine gynecological exam 05/22/2017   Depression 02/01/2015   Moody 03/14/2014   Hot flashes 03/10/2013   GERD 11/29/2009   CONSTIPATION, CHRONIC 11/29/2009   NAUSEA, CHRONIC 11/29/2009    Past Surgical History:  Procedure Laterality Date   COLONOSCOPY  12/13/2008   COLONOSCOPY N/A 03/05/2019   Procedure: COLONOSCOPY;  Surgeon: Shaaron Lamar HERO, MD;  Location: [facility];  Service: Endoscopy;  Laterality: N/A;  10:30   COLONOSCOPY N/A 03/18/2024   Procedure: COLONOSCOPY;  Surgeon: Shaaron Lamar HERO, MD;  Location: [facility];  Service: Endoscopy;  Laterality: N/A;  11:15am, ASA 2   CYSTOSCOPY WITH RETROGRADE PYELOGRAM, URETEROSCOPY AND STENT PLACEMENT Bilateral 08/27/2021   Procedure: BILATERAL RETROGRADE PYELOGRAM, BILATERAL URETEROSCOPY;  Surgeon: Sherrilee Belvie CROME, MD;  Location: [facility];  Service: Urology;  Laterality: Bilateral;   CYSTOSCOPY WITH RETROGRADE PYELOGRAM, URETEROSCOPY AND STENT PLACEMENT Right 09/13/2021   Procedure: CYSTOSCOPY WITH RETROGRADE PYELOGRAM, URETEROSCOPY AND STENT REMOVAL;  Surgeon: Sherrilee Belvie CROME, MD;  Location: [facility];  Service: Urology;  Laterality: Right;   CYSTOSCOPY WITH STENT PLACEMENT Right 08/27/2021   Procedure: CYSTOSCOPY WITH STENT PLACEMENT;  Surgeon: Sherrilee Belvie CROME, MD;  Location: [facility];  Service: Urology;  Laterality: Right;   INGUINAL HERNIA REPAIR Left 02/26/2006   recurrent   INGUINAL HERNIA REPAIR Left    prior to 2007   KNEE ARTHROSCOPY WITH MEDIAL MENISECTOMY Left 12/04/2017   Procedure: KNEE ARTHROSCOPY WITH MEDIAL MENISCECTOMY AND CHONDROPLASTY;  Surgeon: Cristy Bonner DASEN, MD;  Location: [facility];  Service: Orthopedics;  Laterality: Left;  NO BLOCK   POLYPECTOMY  03/05/2019   Procedure: POLYPECTOMY;  Surgeon: Shaaron Lamar HERO, MD;  Location: [facility];  Service: Endoscopy;;   STONE EXTRACTION WITH BASKET Bilateral 08/27/2021   Procedure: STONE EXTRACTION WITH BASKET;  Surgeon: Sherrilee Belvie CROME, MD;  Location: [facility];  Service: Urology;  Laterality: Bilateral;   TOTAL ABDOMINAL HYSTERECTOMY W/ BILATERAL SALPINGOOPHORECTOMY  10/15/2006   TUBAL LIGATION  OB History     Gravida  1   Para  1   Term  1   Preterm      AB      Living  1      SAB      IAB      Ectopic      Multiple      Live Births  1            Home Medications    Prior to Admission medications  Medication Sig Start Date End Date Taking? Authorizing Provider  amLODipine  (NORVASC ) 5 MG tablet Take 1  tablet (5 mg total) by mouth daily. 01/15/24   Signa Delon LABOR, NP  hydrochlorothiazide  (MICROZIDE ) 12.5 MG capsule Take 12.5 mg by mouth daily. 02/12/24   [provider]  rosuvastatin  (CRESTOR ) 10 MG tablet TAKE ONE (1) TABLET BY MOUTH EVERY DAY 05/17/24   Bacchus, Meade PEDLAR, FNP  sertraline  (ZOLOFT ) 50 MG tablet TAKE ONE (1) TABLET BY MOUTH EVERY DAY 04/15/24   Signa Delon LABOR, NP    Family History Family History  Problem Relation Age of Onset   Hypertension Brother    Kidney failure Brother        had kidney transplant   Hyperlipidemia Brother    Hypertension Sister    Hyperlipidemia Sister    Cancer Sister        cervical   Hypertension Brother    Kidney failure Father    Cancer Mother    Colon cancer Neg Hx     Social History Social History[1]   Allergies   Codeine and Propoxyphene n-acetaminophen    Review of Systems Review of Systems Per HPI  Physical Exam Triage Vital Signs ED Triage Vitals  Encounter Vitals Group     BP 05/27/24 0929 (!) 148/83     Girls Systolic BP Percentile --      Girls Diastolic BP Percentile --      Boys Systolic BP Percentile --      Boys Diastolic BP Percentile --      Pulse Rate 05/27/24 0929 (!) 46     Resp 05/27/24 0929 20     Temp 05/27/24 0929 98.1 F (36.7 C)     Temp Source 05/27/24 0929 Oral     SpO2 05/27/24 0929 97 %     Weight --      Height --      Head Circumference --      Peak Flow --      Pain Score 05/27/24 0933 0     Pain Loc --      Pain Education --      Exclude from Growth Chart --    Orthostatic VS for the past 24 hrs:  BP- Lying Pulse- Lying BP- Sitting Pulse- Sitting BP- Standing at 0 minutes Pulse- Standing at 0 minutes  05/27/24 1024 145/80 50 144/78 52 150/81 56    Updated Vital Signs BP (!) 148/83 (BP Location: Right Arm)   Pulse (!) 53   Temp 98.1 F (36.7 C) (Oral)   Resp 20   SpO2 97%   Visual Acuity Right Eye Distance:   Left Eye Distance:   Bilateral Distance:     Right Eye Near:   Left Eye Near:    Bilateral Near:     Physical Exam Vitals and nursing note reviewed.  Constitutional:      General: She is not in acute distress.    Appearance: Normal appearance.  HENT:     Head: Normocephalic.     Right Ear: Tympanic membrane, ear canal and external ear normal.     Left Ear: Tympanic membrane, ear canal and external ear normal.     Nose: Congestion present.     Mouth/Throat:     Mouth: Mucous membranes are moist.  Eyes:     Extraocular Movements: Extraocular movements intact.     Conjunctiva/sclera: Conjunctivae normal.     Pupils: Pupils are equal, round, and reactive to light.  Cardiovascular:     Rate and Rhythm: Regular rhythm. Bradycardia present.     Pulses: Normal pulses.     Heart sounds: Normal heart sounds.  Pulmonary:     Effort: Pulmonary effort is normal. No respiratory distress.     Breath sounds: Normal breath sounds. No stridor. No wheezing, rhonchi or rales.  Abdominal:     General: Bowel sounds are normal.     Palpations: Abdomen is soft.     Tenderness: There is no abdominal tenderness.  Musculoskeletal:     Cervical back: Normal range of motion.  Skin:    General: Skin is warm and dry.  Neurological:     General: No focal deficit present.     Mental Status: She is alert and oriented to person, place, and time.  Psychiatric:        Mood and Affect: Mood normal.        Behavior: Behavior normal.      UC Treatments / Results  Labs (all labs ordered are listed, but only abnormal results are displayed) Labs Reviewed  POCT URINE DIPSTICK - Abnormal; Notable for the following components:      Result Value   Blood, UA trace-intact (*)    Leukocytes, UA Small (1+) (*)    All other components within normal limits  URINE CULTURE  POC COVID19/FLU A&B COMBO    EKG: Sinus bradycardia, no STEMI.   Radiology No results found.  Procedures Procedures (including critical care time)  Medications Ordered in  UC Medications - No data to display  Initial Impression / Assessment and Plan / UC Course  I have reviewed the triage vital signs and the nursing notes.  Pertinent labs & imaging results that were available during my care of the patient were reviewed by me and considered in my medical decision making (see chart for details).  Patient presents for complaints of nausea, vomiting, and diarrhea that have been present for the past several days.  She denies abdominal pain, is well-appearing, and currently is in no acute distress.  She denies any episodes of nausea, vomiting, or diarrhea today.  During initial triage, she was shown to be bradycardic.  EKG shows sinus bradycardia, no STEMI.  No real etiology for bradycardia, will collect CBC and CMP for safety.  Urinalysis did not show any acute dehydration and orthostatic vital signs were negative.  Urine culture is pending.  Patient further denies chest pain, shortness of breath, or difficulty breathing.  Patient's symptoms of nausea, vomiting, and diarrhea, are most likely of viral etiology.  Her COVID/flu test was negative today.  She is also exhibiting some mild upper respiratory symptoms.  Will provide symptomatic treatment with Zofran  4 mg ODT, Promethazine  DM for the cough, and fluticasone  50 mcg nasal spray.  Supportive care recommendations were provided discussed with the patient to include fluids, rest, over-the-counter analgesics, and a BRAT diet.  Patient was given strict ER follow-up precautions, and indications regarding when to follow-up with her  primary care physician.  Repeat heart rate at time of discharge was 53.  Patient was in agreement with this plan of care and verbalizes understanding.  All questions were answered.  Patient stable for discharge.  Work note was provided.   Final Clinical Impressions(s) / UC Diagnoses   Final diagnoses:  Nausea and vomiting, unspecified vomiting type   Discharge Instructions   None    ED  Prescriptions   None    PDMP not reviewed this encounter.     [1]  Social History Tobacco Use   Smoking status: Never   Smokeless tobacco: Never  Vaping Use   Vaping status: Never Used  Substance Use Topics   Alcohol use: No   Drug use: No     Gilmer Etta PARAS, NP 05/27/24 1108

## 2024-05-27 NOTE — Progress Notes (Signed)
 Virtual Visit via Video Note  I connected with Shelby Guerra on 05/27/2024 at  2:00 PM EST by a video enabled telemedicine application and verified that I am speaking with the correct person using two identifiers.  Patient Location: Home Provider Location: Home Office  I discussed the limitations, risks, security, and privacy concerns of performing an evaluation and management service by video and the availability of in person appointments. I also discussed with the patient that there may be a patient responsible charge related to this service. The patient expressed understanding and agreed to proceed.  Subjective: PCP: Edman Meade PEDLAR, FNP  Chief Complaint  Patient presents with   bladder concerns   Back Pain   HPI The patient presents today with complaints of dysuria, back pain and urinary frequency. She denies fever or chills. She also reports that during a recent urgent care visit she was noted to have sinus bradycardia but currently denies symptoms of fatigue, syncope, dizziness, chest pain, or shortness of breath. She reports no family history of heart disease.    ROS: Per HPI Current Medications[1]  Observations/Objective: There were no vitals filed for this visit. Physical Exam Patient is well-developed, well-nourished in no acute distress.  Resting comfortably at home.  Head is normocephalic, atraumatic.  No labored breathing.  Speech is clear and coherent with logical content.  Patient is alert and oriented at baseline.    Assessment and Plan: Acute cystitis with hematuria -     Sulfamethoxazole -Trimethoprim ; Take 1 tablet by mouth 2 (two) times daily for 5 days.  Dispense: 10 tablet; Refill: 0   The urinalysis completed today at urgent care was reviewed.  The patient will be treated with Bactrim .  Pending urine culture. The patients EKG showed sinus bradycardia; however, she is currently asymptomatic.  She was encouraged to increase hydration, rest, and  monitor for symptoms such as fatigue, dizziness, syncope, chest pain, or shortness of breath.  A referral to cardiology will be considered if symptoms develop, persist, or worsen.  Follow Up Instructions: No follow-ups on file.   I discussed the assessment and treatment plan with the patient. The patient was provided an opportunity to ask questions, and all were answered. The patient agreed with the plan and demonstrated an understanding of the instructions.   The patient was advised to call back or seek an in-person evaluation if the symptoms worsen or if the condition fails to improve as anticipated.  The above assessment and management plan was discussed with the patient. The patient verbalized understanding of and has agreed to the management plan.   Meade PEDLAR Edman, FNP    [1]  Current Outpatient Medications:    sulfamethoxazole -trimethoprim  (BACTRIM  DS) 800-160 MG tablet, Take 1 tablet by mouth 2 (two) times daily for 5 days., Disp: 10 tablet, Rfl: 0   amLODipine  (NORVASC ) 5 MG tablet, Take 1 tablet (5 mg total) by mouth daily., Disp: 90 tablet, Rfl: 3   fluticasone  (FLONASE ) 50 MCG/ACT nasal spray, Place 2 sprays into both nostrils daily., Disp: 16 g, Rfl: 0   hydrochlorothiazide  (MICROZIDE ) 12.5 MG capsule, Take 12.5 mg by mouth daily., Disp: , Rfl:    ondansetron  (ZOFRAN -ODT) 4 MG disintegrating tablet, Take 1 tablet (4 mg total) by mouth every 8 (eight) hours as needed., Disp: 20 tablet, Rfl: 0   promethazine -dextromethorphan (PROMETHAZINE -DM) 6.25-15 MG/5ML syrup, Take 5 mLs by mouth 4 (four) times daily as needed., Disp: 118 mL, Rfl: 0   rosuvastatin  (CRESTOR ) 10 MG tablet, TAKE  ONE (1) TABLET BY MOUTH EVERY DAY, Disp: 90 tablet, Rfl: 1   sertraline  (ZOLOFT ) 50 MG tablet, TAKE ONE (1) TABLET BY MOUTH EVERY DAY, Disp: 90 tablet, Rfl: 0

## 2024-05-27 NOTE — Discharge Instructions (Addendum)
 The COVID/flu test was negative. Take medication as prescribed. You may take over-the-counter Tylenol  as needed for pain, fever, or general discomfort. Increase your fluid intake to prevent further dehydration.  Try to drink at least 8-10 8 ounce glasses of water  daily while symptoms persist.  Also recommend Pedialyte or Gatorade for electrolyte replenishment. Recommend a BRAT diet to include bananas, rice, applesauce, or toast. Continue to monitor your symptoms for worsening.  If symptoms fail to improve over the next 24 hours, I would like for you to follow-up in the emergency department for further evaluation.  Also go to the emergency department if you develop chest pain, shortness of breath, increased weakness or fatigue, or other concerns. Follow-up as needed.

## 2024-05-27 NOTE — ED Triage Notes (Signed)
 Pt reports congestion, cough, headache, nausea, vomiting, diarrhea  x 4 days. Denies fever.

## 2024-05-28 ENCOUNTER — Ambulatory Visit (HOSPITAL_COMMUNITY): Payer: Self-pay

## 2024-05-28 LAB — COMPREHENSIVE METABOLIC PANEL WITH GFR
ALT: 11 IU/L (ref 0–32)
AST: 19 IU/L (ref 0–40)
Albumin: 4.4 g/dL (ref 3.9–4.9)
Alkaline Phosphatase: 81 IU/L (ref 49–135)
BUN/Creatinine Ratio: 29 — ABNORMAL HIGH (ref 12–28)
BUN: 21 mg/dL (ref 8–27)
Bilirubin Total: 0.3 mg/dL (ref 0.0–1.2)
CO2: 25 mmol/L (ref 20–29)
Calcium: 9.5 mg/dL (ref 8.7–10.3)
Chloride: 99 mmol/L (ref 96–106)
Creatinine, Ser: 0.73 mg/dL (ref 0.57–1.00)
Globulin, Total: 2.4 g/dL (ref 1.5–4.5)
Glucose: 97 mg/dL (ref 70–99)
Potassium: 3.6 mmol/L (ref 3.5–5.2)
Sodium: 139 mmol/L (ref 134–144)
Total Protein: 6.8 g/dL (ref 6.0–8.5)
eGFR: 90 mL/min/1.73

## 2024-05-28 LAB — CBC WITH DIFFERENTIAL/PLATELET
Basophils Absolute: 0 x10E3/uL (ref 0.0–0.2)
Basos: 0 %
EOS (ABSOLUTE): 0.1 x10E3/uL (ref 0.0–0.4)
Eos: 1 %
Hematocrit: 43.3 % (ref 34.0–46.6)
Hemoglobin: 14.2 g/dL (ref 11.1–15.9)
Immature Grans (Abs): 0 x10E3/uL (ref 0.0–0.1)
Immature Granulocytes: 0 %
Lymphocytes Absolute: 2.1 x10E3/uL (ref 0.7–3.1)
Lymphs: 36 %
MCH: 28.5 pg (ref 26.6–33.0)
MCHC: 32.8 g/dL (ref 31.5–35.7)
MCV: 87 fL (ref 79–97)
Monocytes Absolute: 0.6 x10E3/uL (ref 0.1–0.9)
Monocytes: 11 %
Neutrophils Absolute: 3 x10E3/uL (ref 1.4–7.0)
Neutrophils: 52 %
Platelets: 197 x10E3/uL (ref 150–450)
RBC: 4.98 x10E6/uL (ref 3.77–5.28)
RDW: 12.7 % (ref 11.7–15.4)
WBC: 5.8 x10E3/uL (ref 3.4–10.8)

## 2024-05-30 LAB — URINE CULTURE: Culture: >=100000 — AB

## 2024-06-22 ENCOUNTER — Ambulatory Visit (INDEPENDENT_AMBULATORY_CARE_PROVIDER_SITE_OTHER)

## 2024-06-22 ENCOUNTER — Encounter: Payer: Self-pay | Admitting: Podiatry

## 2024-06-22 ENCOUNTER — Ambulatory Visit (INDEPENDENT_AMBULATORY_CARE_PROVIDER_SITE_OTHER): Admitting: Podiatry

## 2024-06-22 DIAGNOSIS — M2012 Hallux valgus (acquired), left foot: Secondary | ICD-10-CM

## 2024-06-22 DIAGNOSIS — M205X2 Other deformities of toe(s) (acquired), left foot: Secondary | ICD-10-CM | POA: Diagnosis not present

## 2024-06-22 NOTE — Progress Notes (Signed)
"  °  Subjective:  Patient ID: Shelby Guerra, female    DOB: 1955-10-27,   MRN: [id number]  Chief Complaint  Patient presents with   Foot Pain    I have a bunion starting to develop and it's been throbbing. (Left)    69 y.o. female presents for concern of left great toe soreness and throbbing.  She has a spot on the area that has been causing her pain.  She is concerned she has a bunion.  This has been ongoing for some time now.  Relates about a year.. Denies any other pedal complaints. Denies n/v/f/c.   Past Medical History:  Diagnosis Date   GERD (gastroesophageal reflux disease)    High cholesterol    History of kidney stones    Hot flashes 03/10/2013   Hypertension    pt denies   Impaired fasting glucose    Moody 03/14/2014    Objective:  Physical Exam: Vascular: DP/PT pulses 2/4 bilateral. CFT <3 seconds. Normal hair growth on digits. No edema.  Skin. No lacerations or abrasions bilateral feet.  Hyperkeratotic lesion noted to medial left hallux with tenderness to palpation. Musculoskeletal: MMT 5/5 bilateral lower extremities in DF, PF, Inversion and Eversion. Deceased ROM in DF of ankle joint.  Mild spurring noted over the first MPJ with minimal range of motion of the first MPJ. Neurological: Sensation intact to light touch.   Assessment:   1. Hallux limitus, left      Plan:  Patient was evaluated and treated and all questions answered. -Xrays reviewed no acute fractures or dislocations noted.  First MPJ degenerative changes noted with spurring and osteophytes present. -Discussed hallux limitus and callus and  treatement options; conservative and  Surgical management; risks, benefits, alternatives discussed. All patient's questions answered. - Discussed anti-inflammatories like Tylenol  and Aleve . -Hyperkeratotic lesion debrided as courtesy today without incident. -Recommend continue with good supportive shoes and inserts.  Discussed custom molded inserts as well as  over-the-counter inserts.  Attempted to dispense power steps today but were not in stock. -Patient to return to office as needed or sooner if condition worsens.   [name], DPM    "

## 2024-08-30 ENCOUNTER — Encounter

## 2024-09-10 ENCOUNTER — Ambulatory Visit: Admitting: Adult Health

## 2025-03-18 ENCOUNTER — Ambulatory Visit: Admitting: Urology

## 2025-05-26 ENCOUNTER — Ambulatory Visit: Payer: Self-pay
